# Patient Record
Sex: Male | Born: 1950 | Race: Black or African American | Hispanic: No | Marital: Single | Smoking: Former smoker
Health system: Southern US, Community
[De-identification: ages and names within clinical notes are randomized; demographics above are authoritative.]

## PROBLEM LIST (undated history)

## (undated) DIAGNOSIS — F1910 Other psychoactive substance abuse, uncomplicated: Secondary | ICD-10-CM

## (undated) DIAGNOSIS — K219 Gastro-esophageal reflux disease without esophagitis: Secondary | ICD-10-CM

## (undated) DIAGNOSIS — E119 Type 2 diabetes mellitus without complications: Secondary | ICD-10-CM

## (undated) HISTORY — DX: Gastro-esophageal reflux disease without esophagitis: K21.9

## (undated) HISTORY — DX: Other psychoactive substance abuse, uncomplicated: F19.10

## (undated) HISTORY — DX: Type 2 diabetes mellitus without complications: E11.9

## (undated) HISTORY — PX: NASAL FRACTURE SURGERY: SHX718

---

## 2010-08-07 ENCOUNTER — Emergency Department (HOSPITAL_COMMUNITY)
Admission: EM | Admit: 2010-08-07 | Discharge: 2010-08-07 | Disposition: A | Discharge status: Home / Self Care | Payer: No Typology Code available for payment source | Attending: Emergency Medicine | Admitting: Emergency Medicine

## 2010-08-07 DIAGNOSIS — M545 Low back pain, unspecified: Secondary | ICD-10-CM | POA: Insufficient documentation

## 2010-08-07 DIAGNOSIS — S139XXA Sprain of joints and ligaments of unspecified parts of neck, initial encounter: Secondary | ICD-10-CM | POA: Insufficient documentation

## 2010-08-07 DIAGNOSIS — I10 Essential (primary) hypertension: Secondary | ICD-10-CM | POA: Insufficient documentation

## 2010-08-07 DIAGNOSIS — Z794 Long term (current) use of insulin: Secondary | ICD-10-CM | POA: Insufficient documentation

## 2010-08-07 DIAGNOSIS — M542 Cervicalgia: Secondary | ICD-10-CM | POA: Insufficient documentation

## 2010-08-07 DIAGNOSIS — E119 Type 2 diabetes mellitus without complications: Secondary | ICD-10-CM | POA: Insufficient documentation

## 2013-02-28 HISTORY — PX: COLONOSCOPY: SHX174

## 2013-07-12 ENCOUNTER — Encounter: Payer: Self-pay | Admitting: Family Medicine

## 2013-07-12 ENCOUNTER — Ambulatory Visit (INDEPENDENT_AMBULATORY_CARE_PROVIDER_SITE_OTHER): Payer: BC Managed Care – PPO | Admitting: Family Medicine

## 2013-07-12 ENCOUNTER — Telehealth: Payer: Self-pay | Admitting: Family Medicine

## 2013-07-12 VITALS — BP 135/73 | HR 63 | Temp 98.1°F | Ht 66.0 in | Wt 130.0 lb

## 2013-07-12 DIAGNOSIS — Z0000 Encounter for general adult medical examination without abnormal findings: Secondary | ICD-10-CM

## 2013-07-12 DIAGNOSIS — E1165 Type 2 diabetes mellitus with hyperglycemia: Secondary | ICD-10-CM | POA: Insufficient documentation

## 2013-07-12 DIAGNOSIS — R5381 Other malaise: Secondary | ICD-10-CM

## 2013-07-12 DIAGNOSIS — E118 Type 2 diabetes mellitus with unspecified complications: Secondary | ICD-10-CM | POA: Insufficient documentation

## 2013-07-12 DIAGNOSIS — IMO0001 Reserved for inherently not codable concepts without codable children: Secondary | ICD-10-CM

## 2013-07-12 DIAGNOSIS — R5383 Other fatigue: Secondary | ICD-10-CM

## 2013-07-12 DIAGNOSIS — E119 Type 2 diabetes mellitus without complications: Secondary | ICD-10-CM

## 2013-07-12 LAB — POCT GLYCOSYLATED HEMOGLOBIN (HGB A1C): Hemoglobin A1C: 10.5

## 2013-07-12 MED ORDER — ASPIRIN EC 81 MG PO TBEC: 81.0000 mg | DELAYED_RELEASE_TABLET | Freq: Every day | ORAL | Status: DC

## 2013-07-12 MED ORDER — LISINOPRIL 5 MG PO TABS: 5.0000 mg | ORAL_TABLET | Freq: Every day | ORAL | Status: DC

## 2013-07-12 MED ORDER — METFORMIN HCL 500 MG PO TABS: 500.0000 mg | ORAL_TABLET | Freq: Two times a day (BID) | ORAL | Status: DC

## 2013-07-12 MED ORDER — INSULIN GLARGINE 100 UNIT/ML SOLOSTAR PEN: 25.0000 [IU] | PEN_INJECTOR | Freq: Every day | SUBCUTANEOUS | Status: DC

## 2013-07-12 MED ORDER — INSULIN GLARGINE 100 UNIT/ML SOLOSTAR PEN: 20.0000 [IU] | PEN_INJECTOR | Freq: Every day | SUBCUTANEOUS | Status: DC

## 2013-07-12 NOTE — Telephone Encounter (Signed)
Emergency Line Call  Patient calls stating he has not been able to get the lantus. Can't afford due to it being $300. On review of records he saw Dr Althea CharonKaramalegos today and was found to have an A1c of 10.5. He wrote a prescription for lantus and the patient can not afford this. He still has lantus at home at this time and can take this until Monday. He was able to get the metformin. I advised to use the lantus he has at home and to take the metformin as prescribed. I will send this message to Dr Althea CharonKaramalegos for consideration of samples of lantus on Monday. May need additional help in obtaining this medication vs starting a different form of insulin.  Marikay AlarEric Shomari Matusik, MD

## 2013-07-12 NOTE — Progress Notes (Signed)
Subjective:     Patient ID: Jordan GoltzJoseph E Rodriguez, male   DOB: 07/02/1950, 63 y.o.   MRN: [id number]  Patient presents for a new patient evaluation.  HPI  CHRONIC DM, Type 2: Reports (1st dx in 1998) - states it has been a long time since he has had prescribed medicines, in the past he had been occasionally used family member's Lantus, currently almost out. Previously on ACEi, Metformin, daily ASA. Now out of all medications. CBGs: Avg low-mid 100s, Low 83, High 408 (without insulin). Checks CBGs (not regularly, not consistent with 2 hr PP) Meds: Lantus 25u daily (not every day) Reports - poor compliance, irregularly takes meds (no active rx). Tolerating well w/o side-effects Previously on ACEi / ARB Lifestyle: Diet (balanced, eats out frequently / frozen dinners) / Exercise (infrequent walking) Admits occasional fatigue, increased polyuria. Denies visual changes, numbness or tingling.  Health Maintenance: - Never had colonoscopy - Hx prior PNA vaccine  PMH: Denies hx of HTN Surgical Hx: Denies any surgeries.  I have reviewed and updated the following as appropriate: allergies and current medications  Social Hx: - Recently moved back to [place] (previously in [place]) - Works as Copyjanitor full time - Hx substance abuse - Former smoker (quit 1995) - Alcohol abuse (quit 1982)  Review of Systems  See above HPI Also:  Admits to gradual weight loss, unsure how much Denies any recent fever/chills, CP, SOB, HA, weakness, abd pain, n/v, constipation / diarrhea, numbness, tingling.     Objective:   Physical Exam  BP 135/73  Pulse 63  Temp(Src) 98.1 F (36.7 C) (Oral)  Ht 5\' 6"  (1.676 m)  Wt 130 lb (58.968 kg)  BMI 20.99 kg/m2  Gen - thin, well-appearing, pleasant, cooperative, NAD HEENT - NCAT, PERRL, EOMI, oropharynx clear, MMM Neck - supple, non-tender, no thyromegaly Heart - RRR, no murmurs heard Lungs - CTAB, no wheezing, crackles, or rhonchi. Normal work of breathing. Abd  - soft, NTND, no masses, +active BS Ext - non-tender, no edema, peripheral pulses intact +2 b/l Skin - warm, dry, no rashes Neuro - awake, alert, oriented, grossly non-focal, intact muscle strength 5/5 b/l, intact distal sensation to light touch, gait normal      Assessment:     See specific A&P problem list for details.      Plan:     See specific A&P problem list for details.

## 2013-07-12 NOTE — Assessment & Plan Note (Signed)
-   No hx colonoscopy  Plan: 1. Given Colonoscopy handout with # to call for scheduling 2. Sent rx for ASA 81mg  EC daily 3. Baseline labs (CMET, CBC, Fasting Lipids) future ordered, plan for lab only visit

## 2013-07-12 NOTE — Assessment & Plan Note (Signed)
Poorly controlled Current HgbA1c 10.5 (07/12/13), last A1c unknown No complications or hypoglycemia  Plan:  1. New regimen: Lantus 20u daily AM, Metformin 500mg  BID 2. Use current glucose testing supplies - check CBGs fasting AM, 2hr PP (2-3x daily) and record for 1 month, bring to next visit. 3. Start Lisinopril 5mg  daily (for DM renal protection, no hx HTN) 4. Ordered future CMET, Lipid panel - advised pt to return for Lab Only visit fasting for blood draw, review labs at next visit 5. Emphasized lifestyle - Improved DM diet (dec carbs, inc vegs), exercise with walking (previously minimal activity) 6. RTC 1 month to review CBGs, titrate meds, plan to extend to q 3 months

## 2013-07-12 NOTE — Patient Instructions (Signed)
Dear Dolly RiasJoseph Yim, Thank you for coming in to clinic today. It was good to meet you!  Today we discussed your Diabetes and Health Maintenance. 1. For your Diabetes, your A1c was elevated at >10. 2. Prescribed Lantus insulin (start with 20u daily in the evening), take this daily. Also prescribed Metformin 500mg  BID take this twice daily with meals. 3. Please check your blood sugar 2 to 3 times daily ( for the next 1 month) - check it first thing when you wake up, and then 2 hours after breakfast, lunch, and sometimes in the evening 4. Bring your blood sugar readings to your next visit 5. Take daily Aspirin, and Lisinopril 6. Provided sheet for Colonoscopy referral. Please follow the instructions and call the GI doctor office to schedule a colonoscopy as we discussed.  I have ordered labs for you to receive. Recommend calling back our clinic [phone]) to schedule a Lab Only appointment to come in to get your blood drawn, need to be fasting (no food prior for 8 hours), this can be done at any time.  Please schedule a follow-up appointment with me in 1 month (please call to schedule this) for Diabetes check-up.  If you have any other questions or concerns, please feel free to call the clinic to contact me. You may also schedule an earlier appointment if necessary.  However, if your symptoms get significantly worse, please go to the Emergency Department to seek immediate medical attention.  Saralyn PilarAlexander Karamalegos, DO [facility]

## 2013-07-15 ENCOUNTER — Telehealth: Payer: Self-pay | Admitting: Family Medicine

## 2013-07-15 DIAGNOSIS — IMO0001 Reserved for inherently not codable concepts without codable children: Secondary | ICD-10-CM

## 2013-07-15 DIAGNOSIS — E1165 Type 2 diabetes mellitus with hyperglycemia: Principal | ICD-10-CM

## 2013-07-15 MED ORDER — INSULIN GLARGINE 100 UNIT/ML SOLOSTAR PEN: 20.0000 [IU] | PEN_INJECTOR | Freq: Every day | SUBCUTANEOUS | Status: DC

## 2013-07-15 NOTE — Telephone Encounter (Signed)
Patient given 1 lantus pen, will keep lab appt on 5/52.

## 2013-07-15 NOTE — Telephone Encounter (Signed)
Last seen new pt OV 07/12/13 and RE: emergency call about unable to fill Lantus. Prescribed Lantus solostar pen inject 20u daily, however patient unable to afford when attempted to pick up at pharmacy. Patient's insurance listed as BCBS. I called his pharmacy, spoke with pharmacist who said that original cost was >$400, and his insurance reduced it to $300, recommend that he contact his insurance company to ask about the cost (anticipate it is due to a high deductible).  Plan: 1. Recommend patient apply for "Lantus Savings Card" (www.lantus.com/sign-up/offers), or www.lantus.com and click on "Savings Card" button. He is to complete the enrollment information and attempt to apply for card. 2. He may return to [facility] to pick-up a sample Lantus Solostar Pen to provide temporary coverage until his next appointment (already requested to be put aside through [employer]) 3. Patient has upcoming lab only appointment on 07/19/13 @ 9:15am (he may pick up Lantus sample then, please make available for patient pick-up). 4. Recommend scheduling upcoming 1 month follow-up appointment with Dr. Raymondo BandKoval [facility](Pharmacy Clinic) to review DM medication / management options, and provide more assistance with Lantus or insulin benefit programs. Next follow-up with me in total 3 months from 07/12/13 for next A1c check.  If you could please call patient and relay this information, he will need to go ahead and call to schedule the Pharmacy Clinic appointment.  Thank you, Saralyn PilarAlexander Tyreisha Ungar, DO [facility], PGY-1

## 2013-07-15 NOTE — Addendum Note (Signed)
Addended by: Garen GramsBENTON, ASHA F on: 07/15/2013 04:12 PM   Modules accepted: Orders

## 2013-07-19 ENCOUNTER — Other Ambulatory Visit: Payer: No Typology Code available for payment source

## 2013-07-19 DIAGNOSIS — R5383 Other fatigue: Secondary | ICD-10-CM

## 2013-07-19 DIAGNOSIS — E1165 Type 2 diabetes mellitus with hyperglycemia: Principal | ICD-10-CM

## 2013-07-19 DIAGNOSIS — IMO0001 Reserved for inherently not codable concepts without codable children: Secondary | ICD-10-CM

## 2013-07-19 DIAGNOSIS — R5381 Other malaise: Secondary | ICD-10-CM

## 2013-07-19 NOTE — Progress Notes (Signed)
CMP,CBC AND FLP DONE TODAY Jordan Rodriguez 

## 2013-07-20 LAB — COMPREHENSIVE METABOLIC PANEL
ALK PHOS: 78 U/L (ref 39–117)
ALT: 63 U/L — ABNORMAL HIGH (ref 0–53)
AST: 70 U/L — ABNORMAL HIGH (ref 0–37)
Albumin: 3.9 g/dL (ref 3.5–5.2)
BUN: 22 mg/dL (ref 6–23)
CO2: 29 mEq/L (ref 19–32)
CREATININE: 1.02 mg/dL (ref 0.50–1.35)
Calcium: 9.4 mg/dL (ref 8.4–10.5)
Chloride: 102 mEq/L (ref 96–112)
GLUCOSE: 93 mg/dL (ref 70–99)
Potassium: 4.4 mEq/L (ref 3.5–5.3)
SODIUM: 141 meq/L (ref 135–145)
TOTAL PROTEIN: 6.9 g/dL (ref 6.0–8.3)
Total Bilirubin: 0.2 mg/dL (ref 0.2–1.2)

## 2013-07-20 LAB — CBC
HCT: 35.3 % — ABNORMAL LOW (ref 39.0–52.0)
Hemoglobin: 12 g/dL — ABNORMAL LOW (ref 13.0–17.0)
MCH: 27.9 pg (ref 26.0–34.0)
MCHC: 34 g/dL (ref 30.0–36.0)
MCV: 82.1 fL (ref 78.0–100.0)
PLATELETS: 274 10*3/uL (ref 150–400)
RBC: 4.3 MIL/uL (ref 4.22–5.81)
RDW: 14.8 % (ref 11.5–15.5)
WBC: 4.7 10*3/uL (ref 4.0–10.5)

## 2013-07-20 LAB — LIPID PANEL
CHOLESTEROL: 141 mg/dL (ref 0–200)
HDL: 39 mg/dL — ABNORMAL LOW (ref >39)
LDL Cholesterol: 90 mg/dL (ref 0–99)
Total CHOL/HDL Ratio: 3.6 Ratio
Triglycerides: 62 mg/dL (ref <150)
VLDL: 12 mg/dL (ref 0–40)

## 2013-07-23 ENCOUNTER — Encounter: Payer: Self-pay | Admitting: Family Medicine

## 2013-07-31 ENCOUNTER — Encounter: Payer: Self-pay | Admitting: Internal Medicine

## 2013-08-01 ENCOUNTER — Encounter: Payer: Self-pay | Admitting: Pharmacist

## 2013-08-01 ENCOUNTER — Ambulatory Visit (INDEPENDENT_AMBULATORY_CARE_PROVIDER_SITE_OTHER): Payer: BC Managed Care – PPO | Admitting: Pharmacist

## 2013-08-01 VITALS — BP 130/73 | HR 69 | Ht 66.5 in | Wt 136.4 lb

## 2013-08-01 DIAGNOSIS — IMO0001 Reserved for inherently not codable concepts without codable children: Secondary | ICD-10-CM

## 2013-08-01 DIAGNOSIS — E1165 Type 2 diabetes mellitus with hyperglycemia: Principal | ICD-10-CM

## 2013-08-01 MED ORDER — METFORMIN HCL ER 500 MG PO TB24: ORAL_TABLET | ORAL | Status: DC

## 2013-08-01 MED ORDER — INSULIN PEN NEEDLE 31G X 5 MM MISC: 1.0000 | Status: DC | PRN

## 2013-08-01 MED ORDER — INSULIN GLARGINE 100 UNIT/ML SOLOSTAR PEN: 18.0000 [IU] | PEN_INJECTOR | Freq: Every day | SUBCUTANEOUS | Status: DC

## 2013-08-01 NOTE — Patient Instructions (Addendum)
Lantus - change to 18  Units - take in the AM   Continue to take your metformin until gone.   Take one in the AM and Two at night.  The XR (new prescription may be better to tolerate.   Check your blood sugar at a variety of times.  Morning most days AND then other times of the day.   Next visit with Dr. Althea Charon

## 2013-08-01 NOTE — Assessment & Plan Note (Signed)
Diabetes with improved control since elevated A1C and initiation of insulin therapy.  He reports hypoglycemic events and is able to verbalize appropriate hypoglycemia management plan (uses peppermints prn). Patient is mostly meeting goals for blood glucose control, but is suboptimal due to episodes of hypoglycemia. Adjust time of administration and reduce dose of basal insulin Lantus (insulin glargine) to 18 units once daily in the MORNING (instead of the evening).  Increase dose of metformin PO to 500mg  in the morning and 1000mg  in the evening (finishing out current supply).  Once out of current supply, switch to XR dosage form with same directions (prescription sent to pharmacy). Written patient instructions provided.  Follow up on scheduled appointment with PCP on 6/17 and reschedule pharmacy clinic visit when needed.   Total time in face to face counseling: 60 minutes.  Patient seen with Conception Chancy, PharmD Candidate;  Harvie Junior,  PharmD Resident; Forestine Na, PharmD Resident.

## 2013-08-01 NOTE — Progress Notes (Signed)
S:    Jordan Rodriguez is a 63 yo AAM presenting for diabetes evaluation/assessment. Patient with history of type II diabetes who was previously using family member's insulin but recently prescribed Lantus (20 units in the AM, but recently switched to HS dosing) and metformin (500mg  BID) in clinic.  Patient has taken these along with lisinopril since last clinic appointment in mid-May.  He reports some GI side effects upon initiation of metformin, but is now tolerating well.   Reports adherence with medication.   O:  . Lab Results  Component Value Date   HGBA1C 10.5 07/12/2013    Patient takes CBG with Accu-check 360, records since 07/14/2013 available for evaluation Home fasting mid-AM CBG average readings between 85-98 with recording as low as 52, 69, and 73 (mostly since switch to HS dosing) Evening post-prandial/random CBG readings range between 140-170 with occasionaly exertions up to 217, 238, 254...  A/P:  Diabetes with improved control since elevated A1C and initiation of insulin therapy.  He reports hypoglycemic events and is able to verbalize appropriate hypoglycemia management plan (uses peppermints prn). Patient is mostly meeting goals for blood glucose control, but is suboptimal due to episodes of hypoglycemia. Adjust time of administration and reduce dose of basal insulin Lantus (insulin glargine) to 18 units once daily in the MORNING (instead of the evening).  Increase dose of metformin PO to 500mg  in the morning and 1000mg  in the evening (finishing out current supply).  Once out of current supply, switch to XR dosage form with same directions (prescription sent to pharmacy). Written patient instructions provided.  Follow up on scheduled appointment with PCP on 6/17 and reschedule pharmacy clinic visit when needed.   Total time in face to face counseling: 60 minutes.  Patient seen with Conception Chancy, PharmD Candidate;  Harvie Junior,  PharmD Resident; Forestine Na, PharmD Resident.

## 2013-08-02 NOTE — Progress Notes (Signed)
Patient ID: Jordan Rodriguez, male   DOB: 1950-06-14, 63 y.o.   MRN: [id number] Reviewed: Agree with Dr. Macky Lower documentation and management.

## 2013-08-05 ENCOUNTER — Other Ambulatory Visit: Payer: Self-pay | Admitting: *Deleted

## 2013-08-05 DIAGNOSIS — E1165 Type 2 diabetes mellitus with hyperglycemia: Principal | ICD-10-CM

## 2013-08-05 DIAGNOSIS — IMO0001 Reserved for inherently not codable concepts without codable children: Secondary | ICD-10-CM

## 2013-08-05 MED ORDER — INSULIN PEN NEEDLE 31G X 5 MM MISC: 1.0000 | Status: DC | PRN

## 2013-08-05 NOTE — Telephone Encounter (Signed)
Request is for shorter needles.  .memd

## 2013-08-14 ENCOUNTER — Encounter: Payer: Self-pay | Admitting: Family Medicine

## 2013-08-14 ENCOUNTER — Ambulatory Visit (INDEPENDENT_AMBULATORY_CARE_PROVIDER_SITE_OTHER): Payer: BC Managed Care – PPO | Admitting: Family Medicine

## 2013-08-14 DIAGNOSIS — R7401 Elevation of levels of liver transaminase levels: Secondary | ICD-10-CM

## 2013-08-14 DIAGNOSIS — D649 Anemia, unspecified: Secondary | ICD-10-CM

## 2013-08-14 DIAGNOSIS — E1165 Type 2 diabetes mellitus with hyperglycemia: Principal | ICD-10-CM

## 2013-08-14 DIAGNOSIS — IMO0001 Reserved for inherently not codable concepts without codable children: Secondary | ICD-10-CM

## 2013-08-14 DIAGNOSIS — R748 Abnormal levels of other serum enzymes: Secondary | ICD-10-CM | POA: Insufficient documentation

## 2013-08-14 DIAGNOSIS — R740 Nonspecific elevation of levels of transaminase and lactic acid dehydrogenase [LDH]: Secondary | ICD-10-CM

## 2013-08-14 NOTE — Assessment & Plan Note (Addendum)
Asymptomatic. Reviewed prior CBC with Hgb 12.0, no baseline labs to compare. MCV 80s, consistent with normocytic anemia.  Plan: 1. Arranged colonoscopy 09/2013 at [facility] for routine colon CA screening 2. Continue to monitor Hgb 3. Consider iron studies in future

## 2013-08-14 NOTE — Assessment & Plan Note (Addendum)
Improved control Last HgbA1c 10.5 (07/12/13). Occasional mild hypoglycemia No complications  Plan:  1. Continue Lantus 20u daily. 2. Continue Metformin 500mg  AM , 1000mg  PM 3. Improve lifestyle - inc home prepared foods, dec eating out / frozen 4. Set goal for exercise (walking 1-2x days weekly) 5. Advised to contact Lantus company regarding renewal of assistance card. Call clinic if any concerns prior to running out of insulin. 6. RTC 2 months - A1c

## 2013-08-14 NOTE — Assessment & Plan Note (Signed)
Mild elevated transaminases, without predominance. AST 70 / ALT 63, Alk phos 78, T.Bili 0.2 Significant h/o EtOH abuse (since quit > 30 years). Suspect likely Alcoholic Liver Disease, vs fatty liver  Plan: 1. Re-check LFTs in 6 months from initial levels (07/12/13). 2. If persistently elevated, will consider further work-up: Hepatitis panel (B, C), Iron, Ferritin, TIBC, and RUQ [place].

## 2013-08-14 NOTE — Patient Instructions (Addendum)
Dear Jordan Rodriguez, Thank you for coming in to clinic today. It was good to see you again!  Today we discussed your Diabetes. 1. Your blood sugar readings sound much better! I think you are on the right track. 2. Continue to take Lantus 20 units daily in the morning (with breakfast) 3. Continue to take Metformin 1 tab in morning and 2 tabs in evening. 4. Work on your goal of improving exercise! Regular walking 15-3220mins even 1-2 days a week will help get you get motivated and feel better! The goal is to reduce sugar, not to lose weight!  We discussed your lab results. Your Liver tests were mildly elevated, we can discuss this further at future appointments. I would like to re-check these labs.  I'm very glad that you scheduled the Colonoscopy, I will look for the results.  Please keep using the Lantus company discount card for your insulin prescription. If you have any concerns please call the company who provided it to you, and if you need any assistance from our clinic please call and leave us a message. Make sure that you do this well before you run out. I do not want you to run out and then be stuck without insulin!  Some important numbers from today's visit: BP - 129/82  Please schedule a follow-up appointment with me (Dr. Althea CharonKaramalegos) in 2 months for Diabetes follow-up.  If you have any other questions or concerns, please feel free to call the clinic to contact me. You may also schedule an earlier appointment if necessary.  However, if your symptoms get significantly worse, please go to the Emergency Department to seek immediate medical attention.  Saralyn PilarAlexander Francelia Mclaren, DO [facility]

## 2013-08-14 NOTE — Progress Notes (Signed)
Patient ID: Jordan GoltzJoseph E Rodriguez, male   DOB: 02/09/1951, 63 y.o.   MRN: [id number] Subjective:    HPI  CHRONIC DM, Type 2: - Last seen by Dr. Raymondo BandKoval (08/01/13) Reports no concerns, overall doing better since last visit. Able to purchase Lantus supply with company assistance, total cost $75-100.  CBGs: Avg 150, Low 60 (+weak), High 250. Checks CBGs x2 daily (in AM fasting, various times PM, 2-3 hr postprandial), fasting high 100-120 (not >150) Meds: Lantus 20 units daily, Metformin 500mg  1 in AM and 2 in PM Reports good compliance. Tolerating well w/o side-effects Currently on ACEi Lifestyle: Diet (decreased eating out / inc prepared food) Exercise (walking on the job, no structured exercise) Admits occasional hypoglycemia (mild) Denies polyuria, visual changes, numbness or tingling.  Reviewed lab results from prior visit.  Health Maintenance: - Scheduled Colonoscopy, August 2015 [facility]  I have reviewed and updated the following as appropriate: allergies and current medications  Social Hx: - Former smoker (quit 1995)  Review of Systems  See above HPI     Objective:   Physical Exam  There were no vitals taken for this visit.  Gen - thin, well-appearing, pleasant, NAD HEENT - MMM Heart - RRR, no murmurs heard Lungs - CTAB. Normal work of breathing. Abd - soft, NTND, no masses, +active BS Ext - non-tender, no edema, peripheral pulses intact +2 b/l Neuro - awake, alert, oriented     Assessment:     See specific A&P problem list for details.      Plan:     See specific A&P problem list for details.

## 2013-08-19 ENCOUNTER — Telehealth: Payer: Self-pay | Admitting: *Deleted

## 2013-08-19 NOTE — Telephone Encounter (Signed)
[facility] requesting to change pt's insulin pen needle 31 G x 5 MM to 31 G x 6 MM.  Verbal order given.  Clovis PuMartin, Tamika L, RN

## 2013-09-30 ENCOUNTER — Telehealth: Payer: Self-pay | Admitting: *Deleted

## 2013-09-30 NOTE — Telephone Encounter (Signed)
Pt missed previsit appointment scheduled 09/30/13. LM on cell phone with number identifier to call to reschedule previsit appointment today before 5 pm in order to keep colonoscopy appointment scheduled 10/14/13. If previsit not rescheduled today then colonoscopy will be cancelled and both appointments will need to be rescheduled.

## 2013-10-08 ENCOUNTER — Ambulatory Visit (AMBULATORY_SURGERY_CENTER): Payer: Medicare Other

## 2013-10-08 VITALS — Ht 66.0 in | Wt 138.0 lb

## 2013-10-08 DIAGNOSIS — Z1211 Encounter for screening for malignant neoplasm of colon: Secondary | ICD-10-CM

## 2013-10-08 MED ORDER — MOVIPREP 100 G PO SOLR: 1.0000 | Freq: Once | ORAL | Status: DC

## 2013-10-08 NOTE — Progress Notes (Signed)
No allergies to eggs or soy No home oxygen No diet/weight loss meds No past problems with anesthesia  No email 

## 2013-10-09 ENCOUNTER — Telehealth: Payer: Self-pay | Admitting: Internal Medicine

## 2013-10-09 NOTE — Telephone Encounter (Signed)
Spoke with patient's pharmacy, patient was given the Moviprep, no change per pharmacy. Then spoke with patient he was given the right RX Moviprep. He was confused while waiting for it in the pharmacy, so he called us then. Patient is sure he was given the Moviprep and the pharmacy did use the "free" coupon per pt. No further questions from patient at this time.

## 2013-10-14 ENCOUNTER — Ambulatory Visit (AMBULATORY_SURGERY_CENTER): Payer: Medicare Other | Admitting: Internal Medicine

## 2013-10-14 ENCOUNTER — Encounter: Payer: Self-pay | Admitting: Internal Medicine

## 2013-10-14 VITALS — BP 158/101 | HR 59 | Temp 97.5°F | Resp 13 | Ht 66.0 in | Wt 138.0 lb

## 2013-10-14 DIAGNOSIS — Z1211 Encounter for screening for malignant neoplasm of colon: Secondary | ICD-10-CM

## 2013-10-14 LAB — GLUCOSE, CAPILLARY
GLUCOSE-CAPILLARY: 117 mg/dL — AB (ref 70–99)
Glucose-Capillary: 139 mg/dL — ABNORMAL HIGH (ref 70–99)
Glucose-Capillary: 79 mg/dL (ref 70–99)
Glucose-Capillary: 77 mg/dL (ref 70–99)

## 2013-10-14 MED ORDER — SODIUM CHLORIDE 0.9 % IV SOLN: 500.0000 mL | INTRAVENOUS | Status: DC

## 2013-10-14 NOTE — Progress Notes (Signed)
Procedure ends, to recovery, report given and VSS. 

## 2013-10-14 NOTE — Patient Instructions (Signed)
YOU HAD AN ENDOSCOPIC PROCEDURE TODAY AT THE [facility]: Refer to the procedure report that was given to you for any specific questions about what was found during the examination.  If the procedure report does not answer your questions, please call your gastroenterologist to clarify.  If you requested that your care partner not be given the details of your procedure findings, then the procedure report has been included in a sealed envelope for you to review at your convenience later.  YOU SHOULD EXPECT: Some feelings of bloating in the abdomen. Passage of more gas than usual.  Walking can help get rid of the air that was put into your GI tract during the procedure and reduce the bloating. If you had a lower endoscopy (such as a colonoscopy or flexible sigmoidoscopy) you may notice spotting of blood in your stool or on the toilet paper. If you underwent a bowel prep for your procedure, then you may not have a normal bowel movement for a few days.  DIET: Your first meal following the procedure should be a light meal and then it is ok to progress to your normal diet.  A half-sandwich or bowl of soup is an example of a good first meal.  Heavy or fried foods are harder to digest and may make you feel nauseous or bloated.  Likewise meals heavy in dairy and vegetables can cause extra gas to form and this can also increase the bloating.  Drink plenty of fluids but you should avoid alcoholic beverages for 24 hours.  ACTIVITY: Your care partner should take you home directly after the procedure.  You should plan to take it easy, moving slowly for the rest of the day.  You can resume normal activity the day after the procedure however you should NOT DRIVE or use heavy machinery for 24 hours (because of the sedation medicines used during the test).    SYMPTOMS TO REPORT IMMEDIATELY: A gastroenterologist can be reached at any hour.  During normal business hours, 8:30 AM to 5:00 PM Monday through Friday,  call [phone].  After hours and on weekends, please call the GI answering service at [phone] who will take a message and have the physician on call contact you.   Following lower endoscopy (colonoscopy or flexible sigmoidoscopy):  Excessive amounts of blood in the stool  Significant tenderness or worsening of abdominal pains  Swelling of the abdomen that is new, acute  Fever of 100F or higher    FOLLOW UP: If any biopsies were taken you will be contacted by phone or by letter within the next 1-3 weeks.  Call your gastroenterologist if you have not heard about the biopsies in 3 weeks.  Our staff will call the home number listed on your records the next business day following your procedure to check on you and address any questions or concerns that you may have at that time regarding the information given to you following your procedure. This is a courtesy call and so if there is no answer at the home number and we have not heard from you through the emergency physician on call, we will assume that you have returned to your regular daily activities without incident.  SIGNATURES/CONFIDENTIALITY: You and/or your care partner have signed paperwork which will be entered into your electronic medical record.  These signatures attest to the fact that that the information above on your After Visit Summary has been reviewed and is understood.  Full responsibility of the confidentiality   of this discharge information lies with you and/or your care-partner.   Information on high fiber diet given to you today (recommended for diverticulosis)

## 2013-10-14 NOTE — Op Note (Signed)
[facility] [address] [place], [id number]   COLONOSCOPY PROCEDURE REPORT  PATIENT: Jordan GoltzBurroughs, Brantly E.  MR#: [id number] BIRTHDATE: 1950/11/02 , 63  yrs. old GENDER: Male ENDOSCOPIST: Beverley FiedlerJay M Pyrtle, MD REFERRED BY: Saralyn PilarAlexander Karamalegos, MD PROCEDURE DATE:  10/14/2013 PROCEDURE:   Colonoscopy, screening First Screening Colonoscopy - Avg.  risk and is 50 yrs.  old or older Yes.  Prior Negative Screening - Now for repeat screening. N/A  History of Adenoma - Now for follow-up colonoscopy & has been > or = to 3 yrs.  N/A  Polyps Removed Today? No.  Recommend repeat exam, <10 yrs? No. ASA CLASS:   Class II INDICATIONS:average risk screening and first colonoscopy. MEDICATIONS: MAC sedation, administered by CRNA and propofol (Diprivan) 350mg  IV  DESCRIPTION OF PROCEDURE:   After the risks benefits and alternatives of the procedure were thoroughly explained, informed consent was obtained.  A digital rectal exam revealed no rectal mass.   The LB [id number]  endoscope was introduced through the anus and advanced to the cecum, which was identified by both the appendix and ileocecal valve. No adverse events experienced. The quality of the prep was good, using MoviPrep  The instrument was then slowly withdrawn as the colon was fully examined.   COLON FINDINGS: There was mild scattered diverticulosis noted in the ascending colon and at the hepatic flexure.   The colon was otherwise normal.  There was no diverticulosis, inflammation, polyps or cancers unless previously stated.  Retroflexed views revealed no abnormalities. The time to cecum=5 minutes 36 seconds. Withdrawal time=9 minutes 54 seconds.  The scope was withdrawn and the procedure completed.  COMPLICATIONS: There were no complications.  ENDOSCOPIC IMPRESSION: 1.   There was mild diverticulosis noted in the ascending colon and at the hepatic flexure 2.   The colon was otherwise  normal  RECOMMENDATIONS: 1.  High fiber diet 2.  You should continue to follow colorectal cancer screening guidelines for "routine risk" patients with a repeat colonoscopy in 10 years.  There is no need for FOBT (stool) testing for at least 5 years.   eSigned:  Beverley FiedlerJay M Pyrtle, MD 10/14/2013 11:47 AM      cc: The Patient; Saralyn PilarAlexander Karamalegos, MD

## 2013-10-15 ENCOUNTER — Telehealth: Payer: Self-pay | Admitting: *Deleted

## 2013-10-15 NOTE — Telephone Encounter (Signed)
  Follow up Call-  Call back number 10/14/2013  Post procedure Call Back phone  # [phone]  Permission to leave phone message Yes     Patient questions:  Do you have a fever, pain , or abdominal swelling? No. Pain Score  0 *  Have you tolerated food without any problems? Yes.    Have you been able to return to your normal activities? Yes.    Do you have any questions about your discharge instructions: Diet   No. Medications  No. Follow up visit  No.  Do you have questions or concerns about your Care? No.  Actions: * If pain score is 4 or above: No action needed, pain <4.

## 2013-10-28 ENCOUNTER — Ambulatory Visit (INDEPENDENT_AMBULATORY_CARE_PROVIDER_SITE_OTHER): Payer: No Typology Code available for payment source | Admitting: Family Medicine

## 2013-10-28 ENCOUNTER — Encounter: Payer: Self-pay | Admitting: Family Medicine

## 2013-10-28 VITALS — BP 124/68 | HR 61 | Temp 98.1°F | Ht 66.0 in | Wt 135.3 lb

## 2013-10-28 DIAGNOSIS — K5730 Diverticulosis of large intestine without perforation or abscess without bleeding: Secondary | ICD-10-CM

## 2013-10-28 DIAGNOSIS — E1165 Type 2 diabetes mellitus with hyperglycemia: Principal | ICD-10-CM

## 2013-10-28 DIAGNOSIS — IMO0001 Reserved for inherently not codable concepts without codable children: Secondary | ICD-10-CM

## 2013-10-28 LAB — POCT GLYCOSYLATED HEMOGLOBIN (HGB A1C): HEMOGLOBIN A1C: 7.1

## 2013-10-28 MED ORDER — INSULIN GLARGINE 100 UNIT/ML SOLOSTAR PEN: 15.0000 [IU] | PEN_INJECTOR | Freq: Every day | SUBCUTANEOUS | Status: DC

## 2013-10-28 MED ORDER — LISINOPRIL 5 MG PO TABS: 5.0000 mg | ORAL_TABLET | Freq: Every day | ORAL | Status: DC

## 2013-10-28 NOTE — Progress Notes (Signed)
Patient ID: Jordan Rodriguez, male   DOB: [birth year], 63 y.o.   MRN: [id number] Subjective:    HPI  CHRONIC DM, Type 2: Reports no concerns CBGs: Avg 90, Low 50-60 (feels some weakness, faint), High 130. Checks CBGs daily Meds: Lantus 20u daily in morning, Metformin-XR  x 1 AM and x2 PM Reports good compliance. Tolerating well w/o side-effects Currently on ACEi (ran out 1 week ago) Lifestyle: Diet (working on reducing eating out, pre-packed foods) / Exercise (no regular exercise) Admits hypoglycemia Denies polyuria, visual changes, numbness or tingling.  Health Maintenance: - Completed Colonoscopy 10/14/13 - mild diverticulosis, otherwise normal, repeat due 10 years  I have reviewed and updated the following as appropriate: allergies and current medications  Social Hx: - Former smoker  Review of Systems  See above HPI     Objective:   Physical Exam  BP 124/68  Pulse 61  Temp(Src) 98.1 F (36.7 C) (Oral)  Ht  (1.676 m)  Wt 135 lb 4.8 oz (61.372 kg)  BMI 21.85 kg/m2  Gen - well-appearing, pleasant, NAD Heart - RRR, no murmurs heard Lungs - CTAB. Normal work of breathing. Abd - soft, NTND, no masses, +active BS Ext - non-tender, no edema, peripheral pulses intact +2 b/l Neuro - awake, alert, oriented, grossly non-focal, intact muscle strength bilateral ext and distal sensation to light touch, gait normal     Assessment:     See specific A&P problem list for details.      Plan:     See specific A&P problem list for details.

## 2013-10-28 NOTE — Patient Instructions (Signed)
Dear Jordan Rodriguez, Thank you for coming in to clinic today. It was good to see you again!  Today we discussed your Diabetes. 1. Blood sugar looks excellent. A1c 7.1. Congratulations! 2. Reduce your Lantus from 20 to 15 units in morning. Continue checking sugar and bring meter to next visit. 3. If your blood sugars are still low in 60s, then go down to 10 units in morning. 4. Work on your goal of improving exercise! Regular walking 15-87mins even 1-2 days a week will help get you get motivated and feel better! The goal is to reduce sugar, not to lose weight!  Colonoscopy looks good - you are done for 10 years!  Please schedule a follow-up appointment with me (Dr. Althea Charon) in 6 months for Diabetes follow-up.  If you have any other questions or concerns, please feel free to call the clinic to contact me. You may also schedule an earlier appointment if necessary.  However, if your symptoms get significantly worse, please go to the Emergency Department to seek immediate medical attention.  Jordan Pilar, DO [facility]

## 2013-10-29 DIAGNOSIS — K5730 Diverticulosis of large intestine without perforation or abscess without bleeding: Secondary | ICD-10-CM | POA: Insufficient documentation

## 2013-10-29 NOTE — Assessment & Plan Note (Signed)
Significantly improved. Well-controlled. Current HgbA1c 7.1 (10/28/13), last HgbA1c 10.5 (07/12/13). Some significant hypoglycemia No complications DM Foot Exam (normal, 10/28/13)  Plan:  1. Reduce Lantus to 15u daily in AM due to hypoglycemia with CBGs 50-60s and symptomatic, may titrate down gradually to 10 if persistent CBGs 60s 2. Continue Metformin 3. Lifestyle Mods - advised regular exercise with walking, cont work on improving DM diet 4. Plans to schedule eye exam 5. RTC 6 months, A1c

## 2014-02-03 ENCOUNTER — Telehealth: Payer: Self-pay | Admitting: Family Medicine

## 2014-02-03 DIAGNOSIS — E1165 Type 2 diabetes mellitus with hyperglycemia: Secondary | ICD-10-CM

## 2014-02-03 DIAGNOSIS — IMO0002 Reserved for concepts with insufficient information to code with codable children: Secondary | ICD-10-CM

## 2014-02-03 NOTE — Telephone Encounter (Signed)
Pt called back to check the status of his refill request. He has not had any lantus since Friday 12/4. jw

## 2014-02-03 NOTE — Telephone Encounter (Signed)
Pt tried getting his lantus refilled but says insurance no longer covers it, thinks the pharmacy faxed us information. Pt needs to know what to do.

## 2014-02-04 MED ORDER — INSULIN DETEMIR 100 UNIT/ML FLEXPEN: 15.0000 [IU] | PEN_INJECTOR | Freq: Every day | SUBCUTANEOUS | Status: DC

## 2014-02-04 NOTE — Telephone Encounter (Signed)
Pt called back and wants to know what type medication he is going to call in. jw

## 2014-02-04 NOTE — Telephone Encounter (Signed)
Chart reviewed. Patient currently on Lantus 15u daily, however recently ran out 12/4, Lantus no longer covered by ins, required Prior Authorization. [facility], spoke with pharmacist, states PA was faxed (however I do not have it in my box at this time), pharmacist stated that alternatives covered by ins include levemir flextouch pen, phoned in new request of Levemir Flextouch pen 15u inject SQ daily with 15mL dispense (1 box) 3 refills, pharmacy will call and notify patient of this medication change and when it is ready to pick-up.  Called patient, spoke with Mr Dolly RiasJoseph Dyches, relayed the above message, he understand switch to Levemir, same dose, plans to pick up insulin when ready from pharmacy. Questions answered.  Saralyn PilarAlexander Archie Shea, DO [facility], PGY-2

## 2014-02-05 NOTE — Telephone Encounter (Signed)
Pt was told by pharmacy they did not have the levemir and didn't know when they would get some in Please advise

## 2014-06-08 ENCOUNTER — Other Ambulatory Visit: Payer: Self-pay | Admitting: Family Medicine

## 2014-06-08 DIAGNOSIS — E119 Type 2 diabetes mellitus without complications: Secondary | ICD-10-CM

## 2014-09-16 ENCOUNTER — Encounter: Payer: Self-pay | Admitting: Family Medicine

## 2014-09-16 ENCOUNTER — Ambulatory Visit (INDEPENDENT_AMBULATORY_CARE_PROVIDER_SITE_OTHER): Payer: 59 | Admitting: Family Medicine

## 2014-09-16 DIAGNOSIS — E119 Type 2 diabetes mellitus without complications: Secondary | ICD-10-CM

## 2014-09-16 DIAGNOSIS — E1165 Type 2 diabetes mellitus with hyperglycemia: Secondary | ICD-10-CM

## 2014-09-16 DIAGNOSIS — R531 Weakness: Secondary | ICD-10-CM | POA: Diagnosis not present

## 2014-09-16 DIAGNOSIS — IMO0001 Reserved for inherently not codable concepts without codable children: Secondary | ICD-10-CM

## 2014-09-16 LAB — POCT GLYCOSYLATED HEMOGLOBIN (HGB A1C): Hemoglobin A1C: 10.6

## 2014-09-16 NOTE — Assessment & Plan Note (Signed)
Currently hyperglycemia with some general weakness otherwise asymptomatic. Significant worsening control, now considered uncontrolled (recently out Levemir, Metformin x 4-5 days, however prior h/o non-adherence due to cost) Current HgbA1c 10.6 (7/19), last HgbA1c 7.1 (10/28/13). No complications or hypoglycemia.  Plan:  1. Increase Levemir from 20u qd to 25u daily - pick up insulin today (afford due to financial assistance from friend), titration instructions by 2u q 3-5 days if >200 consistently. 2. Increase Metformin-XR to 1000mg  BID (tolerating 500 AM and 1000 PM), will need new rx quantity or str at next visit 3. Lifestyle Mods - Improved DM diet (dec carbs, inc vegs, handout given), exercise 4. Recommend starting mod/high intensity statin - discussed briefly, consider at next visit, concern with cost 5. RTC 2-3 weeks, scheduled 8/3 with me for physical and DM follow-up, will address repeat foot exam, ophtho referral, immunizations (PNA, TDap), need to bring CBG log

## 2014-09-16 NOTE — Progress Notes (Signed)
Patient ID: Jordan GoltzJoseph E Sharrow, male   DOB: 03/14/1950, 64 y.o.   MRN: [id number] Subjective:   Patient presents for a same day appointment.   HPI  HYPERGLYCEMIA / GENERALIZED WEAKNESS: - Patient called with elevated CBG up to 273 this AM and repeat check >500 about 1 hour prior to arrival. [facility], requested to be seen Same Day with provider. - Today complains of generalized weakness for past several days, does not seem to be worsening but little improvement. Feels "fatigued" but no specific localized weakness in arm, leg. Not associated with pain or muscle aches. States he ran out of all medicines including Levemir 4-5 days ago, due to limited finances unable to afford the refill available. However, now has family friend able to purchase meds for next 1-3 months, awaiting to pick up refills today from pharmacy. - Mild decreased PO intake, however seems to be eating overall good recently, but not following a DM diet, not reducing carbs. Admits to staying hydrated, drinking plenty of water. - Denies fevers/chills, CP, SOB, HA, loss of vision, dizziness or vertigo, abd pain, nausea / vomiting, dysuria, back pain, focal weakness, numbness, tingling  CHRONIC DM, Type 2: Reports - concern with unable to afford all medicines for past 4 days, out of Levemir and Metformin CBGs: Avg 150s, Low 113, High 240. Checks CBGs daily fasting x1 AM Meds: Levemir 20u daily in AM (missed 1-2x doses each month for past 1 year), Metformin-XR 500mg  x 1 AM and x2 PM Reports good compliance. Tolerating well w/o side-effects Currently on ACEi (ran out 4-5 day ago) Lifestyle: Diet (admits not following DM diet) / Exercise (no regular exercise) Admits nocturia Denies polyuria, visual changes, numbness or tingling.  I have reviewed and updated the following as appropriate: allergies and current medications  Social Hx: - Former smoker - Currently employed  Review of Systems  See above HPI      Objective:   Physical Exam  BP 148/60 mmHg  Pulse 63  Temp(Src) 98.3 F (36.8 C) (Oral)  Ht 5\' 6"  (1.676 m)  Wt 133 lb 4.8 oz (60.464 kg)  BMI 21.53 kg/m2  Gen - thin, well-appearing, comfortable and cooperative, NAD HEENT - NCAT, PERRL, EOMI, oropharynx clear, mild dry MM and tongue Neck - supple, non-tender, no LAD Heart - RRR, no murmurs heard Lungs - CTAB. Non-labored. Abd - soft, NTND, no masses, +active BS Ext - non-tender, no edema, peripheral pulses intact +2 b/l Neuro - awake, alert, oriented, grossly non-focal, intact muscle str 5/5 b/l grip, biceps flex, knee flex, ankle dorsiflex and distal sensation to light touch, gait normal     Assessment & Plan:     See specific A&P problem list for details.

## 2014-09-16 NOTE — Patient Instructions (Addendum)
Dear Jordan Rodriguez, Thank you for coming in to clinic today. It was good to see you again!  Today we discussed your Diabetes. 1. Elevated blood sugar over past several months - A1c from 7 to now 10. - Increase Levemir from 20u daily to 25u daily start today once get insulin. - You may continue to increase by 2 units every 3 to 5 days, if your fasting morning blood sugar is >200 consistently - If you get to 40units daily then please call and come back sooner - Increase Metformin to 2 tablets (500mg ) in morning and 2 at night  Try to eat healthy DM diet  Diet Recommendations for Diabetes   Starchy (carb) foods include: Bread, rice, pasta, potatoes, corn, crackers, bagels, muffins, all baked goods.   Protein foods include: Meat, fish, poultry, eggs, dairy foods, and beans such as pinto and kidney beans (beans also provide carbohydrate).   1. Eat at least 3 meals and 1-2 snacks per day. Never go more than 4-5 hours while awake without eating.  2. Limit starchy foods to TWO per meal and ONE per snack. ONE portion of a starchy  food is equal to the following:   - ONE slice of bread (or its equivalent, such as half of a hamburger bun).   - 1/2 cup of a "scoopable" starchy food such as potatoes or rice.   - 1 OUNCE (28 grams) of starchy snack foods such as crackers or pretzels (look on label).   - 15 grams of carbohydrate as shown on food label.  3. Both lunch and dinner should include a protein food, a carb food, and vegetables.   - Obtain twice as many veg's as protein or carbohydrate foods for both lunch and dinner.   - Try to keep frozen veg's on hand for a quick vegetable serving.     - Fresh or frozen veg's are best.  4. Breakfast should always include protein.    Work on your goal of improving exercise! Regular walking 15-5620mins even 1-2 days a week will help get you get motivated and feel better! The goal is to reduce sugar, not to lose weight!  Appointment on Wednesday August 3rd  at 1:45pm for DM follow-up  If you have any other questions or concerns, please feel free to call the clinic to contact me. You may also schedule an earlier appointment if necessary.  However, if your symptoms get significantly worse, please go to the Emergency Department to seek immediate medical attention.  Saralyn PilarAlexander Barba Solt, DO [facility]

## 2014-09-16 NOTE — Assessment & Plan Note (Signed)
Likely due to recent hyperglycemia, x 4-5 days without levemir insulin. No focal symptoms, no other associated symptoms. PO well and hydrated. - Afebrile, vitals stable, no tachycardic. Exam non-focal without neuro findings.  Plan: 1. Reassurance. Start with therapy for treating hyperglycemia. 2. Continue regular diet, improve hydration 3. No labs today - due for routine BMET at next physical 2-3 weeks

## 2014-10-01 ENCOUNTER — Ambulatory Visit (INDEPENDENT_AMBULATORY_CARE_PROVIDER_SITE_OTHER): Payer: 59 | Admitting: Family Medicine

## 2014-10-01 ENCOUNTER — Encounter: Payer: Self-pay | Admitting: Family Medicine

## 2014-10-01 VITALS — BP 97/56 | HR 67 | Temp 98.3°F | Ht 66.0 in | Wt 132.8 lb

## 2014-10-01 DIAGNOSIS — R748 Abnormal levels of other serum enzymes: Secondary | ICD-10-CM | POA: Diagnosis not present

## 2014-10-01 DIAGNOSIS — R7989 Other specified abnormal findings of blood chemistry: Secondary | ICD-10-CM

## 2014-10-01 DIAGNOSIS — E1165 Type 2 diabetes mellitus with hyperglycemia: Secondary | ICD-10-CM | POA: Diagnosis not present

## 2014-10-01 DIAGNOSIS — R740 Nonspecific elevation of levels of transaminase and lactic acid dehydrogenase [LDH]: Secondary | ICD-10-CM

## 2014-10-01 DIAGNOSIS — IMO0001 Reserved for inherently not codable concepts without codable children: Secondary | ICD-10-CM

## 2014-10-01 LAB — COMPREHENSIVE METABOLIC PANEL
ALBUMIN: 4.2 g/dL (ref 3.6–5.1)
ALT: 25 U/L (ref 9–46)
AST: 31 U/L (ref 10–35)
Alkaline Phosphatase: 73 U/L (ref 40–115)
BUN: 23 mg/dL (ref 7–25)
CO2: 25 mmol/L (ref 20–31)
Calcium: 9.6 mg/dL (ref 8.6–10.3)
Chloride: 101 mmol/L (ref 98–110)
Creat: 1.3 mg/dL — ABNORMAL HIGH (ref 0.70–1.25)
Glucose, Bld: 56 mg/dL — ABNORMAL LOW (ref 65–99)
POTASSIUM: 4 mmol/L (ref 3.5–5.3)
SODIUM: 140 mmol/L (ref 135–146)
TOTAL PROTEIN: 7.4 g/dL (ref 6.1–8.1)
Total Bilirubin: 0.4 mg/dL (ref 0.2–1.2)

## 2014-10-01 LAB — LDL CHOLESTEROL, DIRECT: LDL DIRECT: 86 mg/dL (ref <130)

## 2014-10-01 MED ORDER — LISINOPRIL 5 MG PO TABS: 5.0000 mg | ORAL_TABLET | Freq: Every day | ORAL | Status: DC

## 2014-10-01 MED ORDER — METFORMIN HCL 1000 MG PO TABS
1000.0000 mg | ORAL_TABLET | Freq: Two times a day (BID) | ORAL | Status: DC
Start: 2014-10-01 — End: 2015-02-11

## 2014-10-01 MED ORDER — ATORVASTATIN CALCIUM 40 MG PO TABS: 40.0000 mg | ORAL_TABLET | Freq: Every day | ORAL | Status: DC

## 2014-10-01 NOTE — Assessment & Plan Note (Addendum)
H/o mild elevated transaminases, likely h/o alcohol vs fatty liver disease - Re-check CMET today, trend LFTs - Consider hepatitis panel, iron studies, ferritin, RUQ [place] in future  Update 10/02/14 Resolved transaminitis - AST 31, ALT 25, Alk phos 73. LFTs significantly resolved from previous mild elevation.

## 2014-10-01 NOTE — Patient Instructions (Signed)
Dear Jordan Rodriguez, Thank you for coming in to clinic today. It was good to see you again!  Today we discussed your Diabetes. 1. Continue Levemir 25u as instructed. Sent new rx Metformin  twice daily (only 1 pill in AM and then 1 in PM) - Stay well hydrated, don't skip meals 2. Start Atorvastatin  daily for reducing risk heart attack, if tolerating well, then next time we can do 90 day supply  Referral to Eye Doctor made, you will get called with apt  Try to eat healthy DM diet  Diet Recommendations for Diabetes   Starchy (carb) foods include: Bread, rice, pasta, potatoes, corn, crackers, bagels, muffins, all baked goods.   Protein foods include: Meat, fish, poultry, eggs, dairy foods, and beans such as pinto and kidney beans (beans also provide carbohydrate).   1. Eat at least 3 meals and 1-2 snacks per day. Never go more than 4-5 hours while awake without eating.  2. Limit starchy foods to TWO per meal and ONE per snack. ONE portion of a starchy  food is equal to the following:   - ONE slice of bread (or its equivalent, such as half of a hamburger bun).   - 1/2 cup of a "scoopable" starchy food such as potatoes or rice.   - 1 OUNCE (28 grams) of starchy snack foods such as crackers or pretzels (look on label).   - 15 grams of carbohydrate as shown on food label.  3. Both lunch and dinner should include a protein food, a carb food, and vegetables.   - Obtain twice as many veg's as protein or carbohydrate foods for both lunch and dinner.   - Try to keep frozen veg's on hand for a quick vegetable serving.     - Fresh or frozen veg's are best.  4. Breakfast should always include protein.    Work on your goal of improving exercise! Regular walking 15-54mins even 1-2 days a week will help get you get motivated and feel better! The goal is to reduce sugar, not to lose weight!  Please schedule follow-up with Dr. Althea Charon in 3 months for Diabetes follow-up.  If you have any  other questions or concerns, please feel free to call the clinic to contact me. You may also schedule an earlier appointment if necessary.  However, if your symptoms get significantly worse, please go to the Emergency Department to seek immediate medical attention.  Saralyn Pilar, DO [facility]

## 2014-10-01 NOTE — Assessment & Plan Note (Addendum)
Improved DM control with less hyperglycemia, review of glucometer Not due for repeat A1c, last 10.6 (09/16/14, with significant worsening from 7 previously) No complications or hypoglycemia. - DM foot exam today normal 8/3 - Declined PNA vaccine  Plan:  1. Continue current therapy - Levemir 25u daily, Metformin  BID 2. Lifestyle Mods - Improved DM diet (dec carbs, inc vegs), again recommend starting regular exercise with walking 3. Check CMET, trend SCr, also f/u LFTs 4. Check direct LDL 5. Start atorvastatin  daily, high intensity statin therapy, ASCVD 10 yr risk >12% 6. Referral to Ophtho for annual DM eye exam 7. RTC 3 months for A1c

## 2014-10-01 NOTE — Progress Notes (Addendum)
Patient ID: Jordan Rodriguez, male   DOB: Nov 16, 1950, 64 y.o.   MRN: [id number] Subjective:    HPI  CHRONIC DM, Type 2: Reports - last visit [facility] 7/19 for hyperglycemia CBG >500. Since improved fatigue and resolved hyperglycemia, has resumed his insulin now. CBGs: Avg still 150s, Low 92, High 380. (has glucometer today) Checks CBGs daily fasting x1 AM Meds: Levemir 25u daily in AM (increased from 20u on 7/19), Metformin-XR  x2 tabs BID (for  BID) Reports good compliance. Tolerating well w/o side-effects Currently on ACEi Lifestyle: Diet (working on improving DM diet) / Exercise (no regular exercise) Improved PO intake Denies polyuria, visual changes, numbness or tingling, nausea vomiting  I have reviewed and updated the following as appropriate: allergies and current medications  Social Hx: - Former smoker  Review of Systems  See above HPI     Objective:   Physical Exam  BP 97/56 mmHg  Pulse 67  Temp(Src) 98.3 F (36.8 C) (Oral)  Ht  (1.676 m)  Wt 132 lb 12.8 oz (60.238 kg)  BMI 21.44 kg/m2  Gen - well-appearing, comfortable, NAD HEENT - MMM Neck - supple, non-tender, no LAD Heart - RRR, no murmurs heard Ext - non-tender, no edema, peripheral pulses intact +2 b/l  DM FOOT EXAM - normal appearance, no lesions, calluses, or ulcers, intact sensation to monofilament bilaterally      Assessment & Plan:     See specific A&P problem list for details.

## 2014-10-02 ENCOUNTER — Telehealth: Payer: Self-pay | Admitting: Family Medicine

## 2014-10-02 DIAGNOSIS — N182 Chronic kidney disease, stage 2 (mild): Secondary | ICD-10-CM

## 2014-10-02 DIAGNOSIS — E1122 Type 2 diabetes mellitus with diabetic chronic kidney disease: Secondary | ICD-10-CM | POA: Insufficient documentation

## 2014-10-02 NOTE — Telephone Encounter (Signed)
Last OV 10/01/14 for DM follow-up. Reviewed results of CMET and direct LDL. See below:  1. CMET - Remarkable for elevated SCr 1.30 (previously 06/2013 at 1.02). See note for details. Likely with worsening poorly controlled DM2 (period of unable to afford insulin within last year, elevated A1c up to 10), additionally recently with dehydration with hyperglycemia and poor PO intake, reduced hydration and increased sodas. 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 - Discussed result with patient advised to HOLD Lisinopril  daily at this time. NO NSAIDs. Improve water hydration (reduce sodas) - RTC 2-3 months, re-check BMET / A1c, return criteria to follow-up sooner  2. Mild transaminitis - resolved. Discussed resolved LFTs, no further work-up.  Saralyn Pilar, DO [facility], PGY-3

## 2014-10-02 NOTE — Telephone Encounter (Signed)
Is returning Dr Verdie Shire call

## 2014-10-02 NOTE — Assessment & Plan Note (Addendum)
CMET results with SCr up to 1.30, from 1.0 about 1 year ago. 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 Currently possible mild dehydration and AoCKD. - Repeat BMET in 3 months, follow SCr

## 2014-10-02 NOTE — Addendum Note (Signed)
Addended by: Smitty Cords on: 10/02/2014 02:40 PM   Modules accepted: Kipp Brood

## 2014-10-02 NOTE — Addendum Note (Signed)
Addended by: Smitty Cords on: 10/02/2014 03:06 PM   Modules accepted: Kipp Brood

## 2014-10-16 ENCOUNTER — Encounter: Payer: 59 | Admitting: Family Medicine

## 2014-11-10 ENCOUNTER — Other Ambulatory Visit: Payer: Self-pay | Admitting: Family Medicine

## 2014-11-10 DIAGNOSIS — E1165 Type 2 diabetes mellitus with hyperglycemia: Principal | ICD-10-CM

## 2014-11-10 DIAGNOSIS — IMO0001 Reserved for inherently not codable concepts without codable children: Secondary | ICD-10-CM

## 2014-11-17 ENCOUNTER — Telehealth: Payer: Self-pay | Admitting: Family Medicine

## 2014-11-17 NOTE — Telephone Encounter (Signed)
Need some samples of insulin, metformin, cholestrol meds.  Doesn't have the money to get these

## 2014-11-17 NOTE — Telephone Encounter (Signed)
Last OV 10/01/14 for chronic DM2 follow-up. Patient remains on Levemir and Metformin, previously with recent worsening DM control with increase A1c 7 to 10 (09/2013 to 08/2014), due next re-check A1c 11/2014. Since patient established, known very significant financial concerns, he was recently employed and has [employer] (from what I can see) but perhaps additional med costs are too much.  I believe we do have Levemir insulin pen samples, and he may have 1-2 of these pens for now, to continue current regimen Levemir 25u daily.  Called patient 11/17/14 to review current financial course, he reported that he has recently lost his job and has been out of insurance for past < 1 month, he will work on finding a new job but is unsure on the status of this. He has never met with [place] before.  I advised him to call [facility] tomorrow to speak with Pamala Hurry to schedule a Financial Advising Visit, to evaluate his situation and determine what options he has, he may qualify for [facility], may be able to get him to HD for MAP program for insulin as well.  Could give patient samples of Levemir insulin pen quantity #2 pens (continue home dose 25u daily), may give during patient's financial visit, otherwise make these available for pick-up for patient, will need to notify him once ready. If levemir is not available, can give Lantus x 2 pens (same dose 25u daily). I don't believe we have samples of Atorvastatin or Metformin, but if these are available, he may have these too.  Advised him to call and schedule a Pharmacy Clinic apt with Dr. Valentina Lucks for Diabetes Management within next 1 month (after he meets with Pamala Hurry) to discuss options for insulin based on his financial situation, may need more samples at that time, may be candidate for application for med assistance through company.  Nobie Putnam, [place], PGY-3

## 2014-11-18 NOTE — Telephone Encounter (Signed)
Spoke with patient, he will be by to pick up Levemir samples today.

## 2014-12-04 ENCOUNTER — Other Ambulatory Visit: Payer: Self-pay | Admitting: Family Medicine

## 2014-12-04 DIAGNOSIS — E1165 Type 2 diabetes mellitus with hyperglycemia: Principal | ICD-10-CM

## 2014-12-04 DIAGNOSIS — E0800 Diabetes mellitus due to underlying condition with hyperosmolarity without nonketotic hyperglycemic-hyperosmolar coma (NKHHC): Secondary | ICD-10-CM

## 2014-12-04 DIAGNOSIS — IMO0001 Reserved for inherently not codable concepts without codable children: Secondary | ICD-10-CM

## 2014-12-04 NOTE — Patient Instructions (Signed)
Check for medications at security on tomorrow and nurse will call regarding his insulin. Return to see nurse next week to complete history

## 2014-12-04 NOTE — Congregational Nurse Program (Signed)
Initial visit for this 64 year old. Compliant from client today that he is out of his metformin,Atorvastatin and Felicity Coyer emir flex pin. States he miss handled  his money . Today he states he  has no way  to  get his medications.  Blood sugar this am was 218 mg and  the day before 198 mg. States his blood sugars got out of control when he stopped taking his medications and used the money on Gambling. Recognizes his problems and  is getting help now. Attending AA meetings and is getting counseling  weekly. States he has 2 sons and 1 daughter . 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 Things were going well until 2000 when i started Gambling more.  .Lost everything and has been in the shelter for one week now. .Diabetic since 1999.  Marland KitchenLost his insurance failed to  pay premiums for 2 months , used the money for gambling. Client is out of medications essential to maintain his diabetes and keep him out of the hospital. Reports his  A1-C is 10.

## 2014-12-05 MED ORDER — INSULIN DETEMIR 100 UNIT/ML ~~LOC~~ SOLN: 25.0000 [IU] | Freq: Every day | SUBCUTANEOUS | Status: DC

## 2014-12-05 NOTE — Telephone Encounter (Signed)
Changed rx to Levemir [place] vials 25 u daily, #11 refills. Discontinued the requested levemir flexpen refill.  Saralyn Pilar, DO [facility], PGY-3

## 2014-12-05 NOTE — Telephone Encounter (Signed)
[facility] called stating patient does not have any insurance and the Levemir FlexPen would cost him a lot more.  Please send in the Levemir vials.  The vials are a lot cheaper for the patient. Clovis Pu, RN

## 2014-12-10 NOTE — Congregational Nurse Program (Signed)
12-04-2014 Tc  to  L. M  to  get  pharmacy approval  of the medication Levemir -flex  Pen  or vial preferably vial  Since  It  Is  cheaper.  Approval given and  Pharmacy  was contacted .Will  Fill  Order  and  deliver  to client.  TC to  client to  let  him know  medications  would  be  [facility]  . Client  thanked the  nurse for  her  Help  Client to  Return  Next week  To  complete  history  and  Monitor blood  Sugar  Was ask  to  gring  Glucose   monitor  to  check  readings   Agreed..Marland Kitchen

## 2014-12-30 DIAGNOSIS — E0800 Diabetes mellitus due to underlying condition with hyperosmolarity without nonketotic hyperglycemic-hyperosmolar coma (NKHHC): Secondary | ICD-10-CM

## 2014-12-30 NOTE — Congregational Nurse Program (Signed)
Congregational Nurse Program Note  Date of Encounter: 12/30/2014  Past Medical History: Past Medical History  Diagnosis Date  . Diabetes mellitus without complication (HCC)   . Substance abuse     hx alcohol (quit 1982) and tobacco (quit 1995)  . GERD (gastroesophageal reflux disease)     Encounter Details:     CNP Questionnaire - 12/30/14 2320    Patient Demographics   Is this a new or existing patient? Existing   Patient is considered a/an Not Applicable   Patient Assistance   Patient's financial/insurance status Low Income  receives $800.00 per  month   Patient referred to apply for the following financial assistance Not Applicable   Food insecurities addressed Not Applicable  [facility]  and  $16 per  month food  stamps   Transportation assistance No  client  drives   Assistance securing medications No  not  this  visit ,but  taking medications   Educational health offerings --  client is  a diabetic ,counseled   Encounter Details   Primary purpose of visit Education/Health Concerns;Chronic Illness/Condition Visit;Spiritual Care/Support Visit;Navigating the Healthcare System   Was an Emergency Department visit averted? Not Applicable   Does patient have a medical provider? Yes   Patient referred to Not Applicable  no referral  counseled  regarding  daily  food  intake , informational  sheets given  on eat more ,eat less   Was a mental health screening completed? (GAINS tool) No   Does patient have dental issues? No   Since previous encounter, have you referred patient for abnormal blood pressure that resulted in a new diagnosis or medication change? No   Since previous encounter, have you referred patient for abnormal blood glucose that resulted in a new diagnosis or medication change? No

## 2014-12-30 NOTE — Congregational Nurse Program (Signed)
Client in  to  see nurse  Today  after  she  saw client in the  hallway last  week  and  ask how  he  was managing.Client  states that he is doing well taking his  Medications and monitoring  His blood  Sugars . Brought  In his meter and blood  Sugars  Have been, for the last several DAYS 169, 151, 193, 152, 98, 207, 164 thi am  114 mg. Nurse questioned  Client about his diet ,states he  eats desert every night , pizza ,hamburger and at breakfast 2 pieces of toast. Nurse counseled regarding  Carbohydrates and sugars how to  Eliminate some carb's from his diet and good substitutes. Drinks a lot of diet sodas several times a day. encouraged to  drink more water.   [facility]   [facility] 6-8 months  [facility]  states it is time  but  has to  wait  until he has his  co-pay  and pay  to  re-instate his  insurance . Questioned  regarding his  A1_C results.  Clients understands what  that is and will pay attention to  what it is.  Going  Out  To look for housing on tomorrow.  Still attending his support group and feels fairly good that he has been clean over 30  Days  From gambling.States he feels he has more control of his life and understands what gambling has done to  Him and why he must continue to get his life  back together.

## 2015-01-06 DIAGNOSIS — E0800 Diabetes mellitus due to underlying condition with hyperosmolarity without nonketotic hyperglycemic-hyperosmolar coma (NKHHC): Secondary | ICD-10-CM

## 2015-01-07 DIAGNOSIS — E0800 Diabetes mellitus due to underlying condition with hyperosmolarity without nonketotic hyperglycemic-hyperosmolar coma (NKHHC): Secondary | ICD-10-CM

## 2015-01-11 NOTE — Congregational Nurse Program (Signed)
Client was seen in the hallway ,nurse ask how  He was doing he states fine , ask if he is taking his medications ,he replied ,I am out of my metformin and cholesterol medication and hopes to  Get them ina day  Or so  But has been out of them several days. Nurse ask  client to  come to see her. Client working on getting his insurance back ,will get paid and catch it up. Nurse counseled  regarding his diabetes and ned for his  Medications as his blood sugars range from 174 mg . Nurse will assist with  His medications to  prevent complications.  TC  and referral  to  [facility]  to  get  clients meds.   Meds to  Be delivered on  Tomorrow.   Given  signs and symptoms of  Hypoglycemia  and  Hyperglycemia, counseled  client .  Follow  Up  Tomorrow .

## 2015-01-11 NOTE — Congregational Nurse Program (Signed)
Follow  Up  Visit. Client states  Medications were delivered, and thanked  Nurse, brought in his glucose  meter to  review his  readings.  States this am blood  sugar  was 122 mg. Nurse counseled and   commended on that result. States he has stopped  drinking as many  sodas per day , drinking more water and looking at other healthy behavior changes he can make . States she is still doing well with his  other problem "gambling". Taking his  Insulin. Blodd sugars ranging most  days from 97mg  -197mg  most days 101 mg- 174 mg.

## 2015-01-13 ENCOUNTER — Other Ambulatory Visit: Payer: Self-pay | Admitting: Family Medicine

## 2015-01-13 DIAGNOSIS — E1165 Type 2 diabetes mellitus with hyperglycemia: Principal | ICD-10-CM

## 2015-01-13 DIAGNOSIS — IMO0001 Reserved for inherently not codable concepts without codable children: Secondary | ICD-10-CM

## 2015-01-14 DIAGNOSIS — E0800 Diabetes mellitus due to underlying condition with hyperosmolarity without nonketotic hyperglycemic-hyperosmolar coma (NKHHC): Secondary | ICD-10-CM

## 2015-01-14 NOTE — Congregational Nurse Program (Signed)
Client  In for  blood  sugar monitoring . States am  blood  sugar was 138 mg in the am , blood  Sugars  By  his  meter have  been running  from 122- 245 mg  ,taking his meds almost out of  Insulin pen , to pick up tomorrow ,insurance ban because of non payment on premium lifted ,has paid  Premium now. Client was alert no  obvious  symptoms  of Low  blood  sugar but states he didn't eat lunch.Encouraged to eat meals but as always to watch what he eats

## 2015-01-28 DIAGNOSIS — E0800 Diabetes mellitus due to underlying condition with hyperosmolarity without nonketotic hyperglycemic-hyperosmolar coma (NKHHC): Secondary | ICD-10-CM

## 2015-01-28 NOTE — Congregational Nurse Program (Signed)
Client present to see nurse this pm to  Have blood  Sugar checked . States he is now  Taking 15 units  Of  Insulin and checking his blood sugars  Each am . Brought his meter in and nurse reviewed . Blood  Sugars  have been 01-27-15- 183 mg, 01-26-15  137 mg please  Look at clients meter readings  As blood sugars  Are jumping up and down.  Counseled today  regarding his 24 mg reading and what his A1-C readings  Would  Be . Gave  Client a flier on know  Your  Numbers  As a guide to  How  He managing his  Blood sugars. Client [facility]  [facility] two [facility] ,will know  Friday  If he is eligible for placement or will be discharged by 02-06-15. Ask if he has a plan B he state he will look at what  He needs to  Do  And see what emergency services he can tap into.  Client  Counseled  regarding healthy eating as it  seems he is consuming lots of  carbohydrates  and fried foods. Talked about  more vegetables and fruit ,less carbohydrates. Client has stopped consuming as many sodas which he was commended on.   To see nurse next week, bring PCP appointment date with him.

## 2015-01-28 NOTE — Congregational Nurse Program (Signed)
No  Notes  By  Nurse  This  Day another entry  Was made .

## 2015-02-03 DIAGNOSIS — E0800 Diabetes mellitus due to underlying condition with hyperosmolarity without nonketotic hyperglycemic-hyperosmolar coma (NKHHC): Secondary | ICD-10-CM

## 2015-02-04 NOTE — Congregational Nurse Program (Signed)
Nurse  Spoke  With client in the hallway and he states he got a part time job today working for [employer]. Nurse encouraged client as he states he is waiting on his housing voucher . Client receives  Disability and with his part time job client will  Be able to sub stain himself if he manages his budget well. He talks about  His  experience at the shelter has allowed him time to think about his life and what he needs to do  To keep his life in order.  Client has been able to control his gambling problem with support and is to be commended on his lifestyle behavior change. He  Made his appointment today to follow  With his PCP for 12-20 as it is time for another A1-C level. His blood  Sugar readings  Are ranging  From 96-mg  - 208 mg. Counseling has been done to change some of his nutritional habits  As drinking lots of sodas ,drinking more water and choosing less carbohydrates  And he is aware of the need for more vegetables and fruit in his diet.He skips  Meals a lot and has been counseled on the dangers of taking insulin with no food and starving his body  of  needed  nutrients ..Marland Kitchen

## 2015-02-11 ENCOUNTER — Other Ambulatory Visit: Payer: Self-pay | Admitting: Family Medicine

## 2015-02-11 DIAGNOSIS — E1165 Type 2 diabetes mellitus with hyperglycemia: Principal | ICD-10-CM

## 2015-02-11 DIAGNOSIS — IMO0001 Reserved for inherently not codable concepts without codable children: Secondary | ICD-10-CM

## 2015-02-17 ENCOUNTER — Ambulatory Visit (INDEPENDENT_AMBULATORY_CARE_PROVIDER_SITE_OTHER): Payer: 59 | Admitting: Family Medicine

## 2015-02-17 ENCOUNTER — Encounter: Payer: Self-pay | Admitting: Family Medicine

## 2015-02-17 VITALS — BP 114/59 | HR 74 | Temp 98.4°F | Ht 66.0 in | Wt 134.5 lb

## 2015-02-17 DIAGNOSIS — E1165 Type 2 diabetes mellitus with hyperglycemia: Secondary | ICD-10-CM

## 2015-02-17 DIAGNOSIS — Z1159 Encounter for screening for other viral diseases: Secondary | ICD-10-CM

## 2015-02-17 DIAGNOSIS — E1122 Type 2 diabetes mellitus with diabetic chronic kidney disease: Secondary | ICD-10-CM

## 2015-02-17 DIAGNOSIS — E0800 Diabetes mellitus due to underlying condition with hyperosmolarity without nonketotic hyperglycemic-hyperosmolar coma (NKHHC): Secondary | ICD-10-CM

## 2015-02-17 DIAGNOSIS — N182 Chronic kidney disease, stage 2 (mild): Secondary | ICD-10-CM

## 2015-02-17 DIAGNOSIS — R748 Abnormal levels of other serum enzymes: Secondary | ICD-10-CM

## 2015-02-17 DIAGNOSIS — Z23 Encounter for immunization: Secondary | ICD-10-CM | POA: Diagnosis not present

## 2015-02-17 DIAGNOSIS — R7989 Other specified abnormal findings of blood chemistry: Secondary | ICD-10-CM

## 2015-02-17 DIAGNOSIS — IMO0001 Reserved for inherently not codable concepts without codable children: Secondary | ICD-10-CM

## 2015-02-17 LAB — BASIC METABOLIC PANEL WITH GFR
BUN: 19 mg/dL (ref 7–25)
CHLORIDE: 100 mmol/L (ref 98–110)
CO2: 26 mmol/L (ref 20–31)
CREATININE: 1.1 mg/dL (ref 0.70–1.25)
Calcium: 9.6 mg/dL (ref 8.6–10.3)
GFR, Est African American: 82 mL/min (ref >=60)
GFR, Est Non African American: 71 mL/min (ref >=60)
Glucose, Bld: 76 mg/dL (ref 65–99)
Potassium: 4 mmol/L (ref 3.5–5.3)
Sodium: 139 mmol/L (ref 135–146)

## 2015-02-17 LAB — POCT GLYCOSYLATED HEMOGLOBIN (HGB A1C): HEMOGLOBIN A1C: 10.6

## 2015-02-17 NOTE — Patient Instructions (Signed)
Dear Jordan RiasJoseph Rodriguez, Thank you for coming in to clinic today. It was good to see you again!  Today we discussed your Diabetes. 1.increase dose Levemir to 27 units daily, keep checking morning fasting blood sugar, if this is >120 persistently you can increase to 30 units daily, may gradually go up by 2 units every 5 days until 40 units. - Start checking Blood sugar 2 to 3 times a day, morning and bedtime, also 2 hours after eating lunch or dinner - Stay well hydrated, don't skip meals  You have all refills at pharmacy.  Blood work today - will notify you if concerned.  Flu shot today - you may get a fever and muscle aches, this is normal reaction to shot, this should get better in 1-2 days  Try to schedule appointment with [facility] in future if you can afford this follow-up for Annual Diabetic Eye Exam  Try to eat healthy DM diet  Diet Recommendations for Diabetes   Starchy (carb) foods include: Bread, rice, pasta, potatoes, corn, crackers, bagels, muffins, all baked goods.   Protein foods include: Meat, fish, poultry, eggs, dairy foods, and beans such as pinto and kidney beans (beans also provide carbohydrate).   1. Eat at least 3 meals and 1-2 snacks per day. Never go more than 4-5 hours while awake without eating.  2. Limit starchy foods to TWO per meal and ONE per snack. ONE portion of a starchy  food is equal to the following:   - ONE slice of bread (or its equivalent, such as half of a hamburger bun).   - 1/2 cup of a "scoopable" starchy food such as potatoes or rice.   - 1 OUNCE (28 grams) of starchy snack foods such as crackers or pretzels (look on label).   - 15 grams of carbohydrate as shown on food label.  3. Both lunch and dinner should include a protein food, a carb food, and vegetables.   - Obtain twice as many veg's as protein or carbohydrate foods for both lunch and dinner.   - Try to keep frozen veg's on hand for a quick vegetable serving.     - Fresh  or frozen veg's are best.  4. Breakfast should always include protein.    Work on your goal of improving exercise! Regular walking 15-7120mins even 1-2 days a week will help get you get motivated and feel better! The goal is to reduce sugar, not to lose weight!  Please schedule follow-up with Jordan. Althea Rodriguez in 3 months for Diabetes follow-up.  If you have any other questions or concerns, please feel free to call the clinic to contact me. You may also schedule an earlier appointment if necessary.  However, if your symptoms get significantly worse, please go to the Emergency Department to seek immediate medical attention.  Jordan PilarAlexander Karamalegos, DO [facility]

## 2015-02-17 NOTE — Progress Notes (Signed)
Subjective:    Patient ID: Jordan Rodriguez, male    DOB: Oct 14, 1950, 64 y.o.   MRN: [id number]  Jordan Rodriguez is a 64 y.o. male presenting on 02/17/2015 for Diabetes  HPI  CHRONIC DM, Type 2: - Last [facility] visit 09/2014, has received insulin samples and now paid off pills and insurance re-activated, receiving levemir rx now. - maybe up to 2 weeks without insulin during past 5 months - Attributes to poor dietary habits, usually eating daily sweets including cake, and eats sweet snack at night if not eating dinner well. Difficulty with homelessness, poor financial situation. Drinking mostly water and 0 cal sweetener, no sodas or sweet. - No regular exercise CBGs: Avg still 150-170, Low 120, High 300s. (forgot to bring glucometer it is kept safe in shelter) Checks CBGs daily fasting x1 AM Meds: Levemir 25u daily, Metformin-XR  x2 tabs BID (for  BID) Reports good compliance. Tolerating well w/o side-effects Currently on ACEi Denies polyuria, visual changes, numbness or tingling, nausea vomiting  Social Hx: Insurance active, no longer pursuing [employer] assistance since insurance was active. 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 Diabetes mellitus without complication (HCC)   . Substance abuse     hx alcohol (quit 1982) and tobacco (quit 1995)  . GERD (gastroesophageal reflux disease)     Social History   Social History  . Marital Status: Single    Spouse Name: N/A  . Number of Children: N/A  . Years of Education: N/A   Occupational History  . Not on file.   Social History Main Topics  . Smoking status: Former Smoker    Types: Cigarettes    Quit date: 02/28/1993  . Smokeless tobacco:  Never Used  . Alcohol Use: No  . Drug Use: No  . Sexual Activity: Not on file   Other Topics Concern  . Not on file   Social History Narrative    Current Outpatient Prescriptions on File Prior to Visit  Medication Sig  . aspirin EC 81 MG tablet Take 1 tablet (81 mg total) by mouth daily.  Marland Kitchen atorvastatin (LIPITOR) 40 MG tablet TAKE ONE TABLET BY MOUTH ONCE DAILY  . Insulin Pen Needle 31G X 5 MM MISC 1 Container by Does not apply route as needed. Supply QS for 1 time daily injection. Dx:  250.02  . LEVEMIR FLEXTOUCH 100 UNIT/ML Pen INJECT 15 UNITS SUBCUTANEOUSLY EVERY DAY.  Marland Kitchen lisinopril (PRINIVIL,ZESTRIL) 5 MG tablet Take 1 tablet (5 mg total) by mouth daily.  . metFORMIN (GLUCOPHAGE) 1000 MG tablet TAKE ONE TABLET BY MOUTH TWICE DAILY WITH  A  MEAL   No current facility-administered medications on file prior to visit.    Review of Systems Per HPI unless specifically indicated above     Objective:    BP 114/59 mmHg  Pulse 74  Temp(Src) 98.4 F (36.9 C) (Oral)  Ht  (1.676 m)  Wt 134 lb 8 oz (61.009 kg)  BMI 21.72 kg/m2  Wt Readings from Last 3 Encounters:  02/17/15 134 lb 8 oz (61.009 kg)  12/03/14 133 lb (60.328 kg)  10/01/14 132 lb 12.8 oz (60.238 kg)    Physical Exam  Constitutional: He is oriented to person, place,  and time. He appears well-developed and well-nourished. No distress.  Thin, well-appearing, comfortable  HENT:  Head: Normocephalic and atraumatic.  Mouth/Throat: Oropharynx is clear and moist.  Cardiovascular: Normal rate, regular rhythm, normal heart sounds and intact distal pulses.   No murmur heard. Pulmonary/Chest: Effort normal and breath sounds normal. No respiratory distress. He has no wheezes. He has no rales.  Neurological: He is alert and oriented to person, place, and time.  Skin: Skin is warm and dry. He is not diaphoretic.  Psychiatric: He has a normal mood and affect. His behavior is normal.  Nursing note and vitals  reviewed.  Results for orders placed or performed in visit on 02/17/15  BASIC METABOLIC PANEL WITH GFR  Result Value Ref Range   Sodium 139 135 - 146 mmol/L   Potassium 4.0 3.5 - 5.3 mmol/L   Chloride 100 98 - 110 mmol/L   CO2 26 20 - 31 mmol/L   Glucose, Bld 76 65 - 99 mg/dL   BUN 19 7 - 25 mg/dL   Creat 4.541.10 0.980.70 - 1.191.25 mg/dL   Calcium 9.6 8.6 - 14.710.3 mg/dL   GFR, Est African American 82 >=60 mL/min   GFR, Est Non African American 71 >=60 mL/min  Hepatitis C antibody  Result Value Ref Range   HCV Ab NEGATIVE NEGATIVE  HgB A1c  Result Value Ref Range   Hemoglobin A1C 10.6       Assessment & Plan:   Problem List Items Addressed This Visit    CKD stage 2 due to type 2 diabetes mellitus (HCC)    Consistent with CKD-II with resolved AKI. Improved Cr 1.3 to 1.10 today on BMET - Improve DM control       Diabetes mellitus type 2, uncontrolled, without complications (HCC) - Primary    Stable uncontrolled DM2 without improvement. Suspected non-adherence to dietary plan, infrequent meals, high carbs / sweets, mostly due to poor financial and homeless situation, but [facility] at [facility] and has nursing weekly checks. A1c 10.6 stable since 08/2014 CKD Stage II, improved with stable Cr now - Flu shot given  Plan:  1. Increase Levemir 25u to 27u daily, titrate instructions up to max 40u daily, continue Metformin 1000mg  BID - Check CBGs more regularly, need to bring in log to adjust further 2. Lifestyle Mods - Improved DM diet (dec carbs, inc vegs), again recommend starting regular exercise with walking 3. Check BMET follow Cr - improved 1.30 to 1.10 suspected baseline 5. Continue atorvastatin 40, ASA 81 daily 6. [facility] apt [facility] couldn't afford co-pay, follow-up when he can 7. RTC 3 months for A1c - if still not improved, follow-up pharm clinic      Relevant Orders   HgB A1c (Completed)   BASIC METABOLIC PANEL WITH GFR (Completed)    Other Visit Diagnoses     Need for hepatitis C screening test        Relevant Orders    Hepatitis C antibody (Completed)    Encounter for immunization           No orders of the defined types were placed in this encounter.      Follow up plan: Return in about 3 months (around 05/18/2015) for diabetes.  Saralyn PilarAlexander Karamalegos, DO [facility], PGY-3

## 2015-02-17 NOTE — Congregational Nurse Program (Signed)
Client in today past  his  follow  up  visit  With his PCP. Commended him  on his  follow  up. Review of PCP notes with  Client .  Notes  iIndicated that his  A1-_C  was 10.6   today . Counseled  regarding the result and what he needs to  work to  . Client was   given a sheet  On Know  Your  Numbers. We had  Reviewed  Earlier.  We reviewed his  Medication list  And  talked  About  Exercise at  Least  3 days  Per week , increasing his  Insulin and  Taking  His  Blood  Sugars.  [name]  Want to  Work on getting his  Blood  Sugar  Levels  Down  And  Takes in all suggestions  But  Likes to  Eat a certain way . We  Talked  About  Food preparation, ways to eliminate some fat in his  Cooking , how to  Make  Better  Selections . He  Understands the need for decrease of  sodas in his daily  diet. and  has really been working on life style  Behaviors.  Client states  he  has  [name]\to  move  out  by  03-06-15 and is  still  looking  for  housing .  Has one studio suite  that  may  work  but doesn't  want to  sign a year long  Lease.

## 2015-02-18 LAB — HEPATITIS C ANTIBODY: HCV Ab: NEGATIVE

## 2015-02-18 NOTE — Assessment & Plan Note (Signed)
Consistent with CKD-II with resolved AKI. Improved Cr 1.3 to 1.10 today on BMET - Improve DM control

## 2015-02-18 NOTE — Assessment & Plan Note (Signed)
Stable uncontrolled DM2 without improvement. Suspected non-adherence to dietary plan, infrequent meals, high carbs / sweets, mostly due to poor financial and homeless situation, but [facility] at [facility] and has nursing weekly checks. A1c 10.6 stable since 08/2014 CKD Stage II, improved with stable Cr now - Flu shot given  Plan:  1. Increase Levemir 25u to 27u daily, titrate instructions up to max 40u daily, continue Metformin 1000mg  BID - Check CBGs more regularly, need to bring in log to adjust further 2. Lifestyle Mods - Improved DM diet (dec carbs, inc vegs), again recommend starting regular exercise with walking 3. Check BMET follow Cr - improved 1.30 to 1.10 suspected baseline 5. Continue atorvastatin 40, ASA 81 daily 6. [facility] couldn't afford co-pay, follow-up when he can 7. RTC 3 months for A1c - if still not improved, follow-up pharm clinic

## 2015-05-11 ENCOUNTER — Telehealth: Payer: Self-pay | Admitting: Family Medicine

## 2015-05-11 DIAGNOSIS — IMO0001 Reserved for inherently not codable concepts without codable children: Secondary | ICD-10-CM

## 2015-05-11 DIAGNOSIS — Z794 Long term (current) use of insulin: Principal | ICD-10-CM

## 2015-05-11 DIAGNOSIS — E1165 Type 2 diabetes mellitus with hyperglycemia: Principal | ICD-10-CM

## 2015-05-11 MED ORDER — INSULIN DETEMIR 100 UNIT/ML FLEXPEN: 30.0000 [IU] | PEN_INJECTOR | Freq: Every day | SUBCUTANEOUS | Status: DC

## 2015-05-11 NOTE — Telephone Encounter (Signed)
Pt states he is having difficulties getting his Levermir. He states that he was changed to 30 Units but the RX still states 15 Units and he has run out. The Pharmacy is trying to charge him full price bc they say he is picking up the meds early. Please advise. [facility] [address]

## 2015-05-11 NOTE — Telephone Encounter (Signed)
Reviewed chart, last saw patient 01/2015, at that time he had titrated up to Levemir 25u and still titrating up as advised. The rx for Levemir pens was from 12/2014, and was not edited in 01/2015 to reflect his new increased insulin dose. [facility] [address] [facility], spoke with pharmacist, who confirmed that due to old rx reading 15u daily he was considered to be refilling it too early. Now with updated rx phoned in Levemir 30u daily, dispense 15mL or 5 pens at 3 mL per, +2 refills, it will be covered by insurance and cost is $40, it will be ready for pick-up.  Called patient to notify him rx can be picked up. He was appreciative, but did state that prior ins coverage was for $20 for 5 pens, and is unsure why price has increased. He will ask pharmacy.  Saralyn PilarAlexander Cyan Moultrie, DO [facility], PGY-3

## 2015-05-21 ENCOUNTER — Ambulatory Visit (INDEPENDENT_AMBULATORY_CARE_PROVIDER_SITE_OTHER): Payer: BLUE CROSS/BLUE SHIELD | Admitting: Family Medicine

## 2015-05-21 ENCOUNTER — Encounter: Payer: Self-pay | Admitting: Family Medicine

## 2015-05-21 VITALS — BP 134/60 | HR 79 | Temp 98.4°F | Ht 66.0 in | Wt 133.6 lb

## 2015-05-21 DIAGNOSIS — Z794 Long term (current) use of insulin: Secondary | ICD-10-CM

## 2015-05-21 DIAGNOSIS — E1165 Type 2 diabetes mellitus with hyperglycemia: Secondary | ICD-10-CM | POA: Diagnosis not present

## 2015-05-21 DIAGNOSIS — IMO0001 Reserved for inherently not codable concepts without codable children: Secondary | ICD-10-CM

## 2015-05-21 LAB — POCT GLYCOSYLATED HEMOGLOBIN (HGB A1C): HEMOGLOBIN A1C: 9

## 2015-05-21 MED ORDER — ATORVASTATIN CALCIUM 40 MG PO TABS: 40.0000 mg | ORAL_TABLET | Freq: Every day | ORAL | Status: DC

## 2015-05-21 MED ORDER — METFORMIN HCL 1000 MG PO TABS: ORAL_TABLET | ORAL | Status: DC

## 2015-05-21 NOTE — Patient Instructions (Addendum)
Thank you for coming in to clinic today.  1. For your Diabetes - It is much better controlled, A1c down to 9.0 from prior 10.6! Great job. Keep up the good work, try to eat healthy, stay active, exercise will help, and continue to take insulin as you are at 30 units (levemir) - You [facility], to ask if Lantus 30 units a day, would be cheaper option, please call me back to let me know what they prefer. - Check blood sugar 1-2x daily fasting in morning or 2 hour after eating lunch or dinner. If you are low on strips, you can check once daily is fine. - Stay well hydrated, don't skip meals  Refilled Atorvastatin and Metformin  Try to schedule appointment with [facility] in future if you can afford this follow-up for Annual Diabetic Eye Exam Address: [address], [place], [place] [address] Phone: [phone]  Diet Recommendations for Diabetes   Starchy (carb) foods include: Bread, rice, pasta, potatoes, corn, crackers, bagels, muffins, all baked goods.   Protein foods include: Meat, fish, poultry, eggs, dairy foods, and beans such as pinto and kidney beans (beans also provide carbohydrate).   1. Eat at least 3 meals and 1-2 snacks per day. Never go more than 4-5 hours while awake without eating.  2. Limit starchy foods to TWO per meal and ONE per snack. ONE portion of a starchy  food is equal to the following:   - ONE slice of bread (or its equivalent, such as half of a hamburger bun).   - 1/2 cup of a "scoopable" starchy food such as potatoes or rice.   - 1 OUNCE (28 grams) of starchy snack foods such as crackers or pretzels (look on label).   - 15 grams of carbohydrate as shown on food label.  3. Both lunch and dinner should include a protein food, a carb food, and vegetables.   - Obtain twice as many veg's as protein or carbohydrate foods for both lunch and dinner.   - Try to keep frozen veg's on hand for a quick vegetable serving.     - Fresh or frozen  veg's are best.  4. Breakfast should always include protein.    Work on your goal of improving exercise! Regular walking 15-1720mins even 1-2 days a week will help get you get motivated and feel better! The goal is to reduce sugar, not to lose weight!  Please schedule follow-up with Dr. Althea CharonKaramalegos in 3 months for Diabetes follow-up, or if blood sugars doing very well, you can come back in 5 to 6 months for Annual Physical.  If you have any other questions or concerns, please feel free to call the clinic to contact me. You may also schedule an earlier appointment if necessary.  However, if your symptoms get significantly worse, please go to the Emergency Department to seek immediate medical attention.  Saralyn PilarAlexander Karamalegos, DO [facility]

## 2015-05-21 NOTE — Progress Notes (Signed)
Subjective:    Patient ID: Jordan Rodriguez, male    DOB: [birth year], 65 y.o.   MRN: [id number]  Jordan Rodriguez is a 65 y.o. male presenting on 05/21/2015 for Diabetes  HPI  CHRONIC DM, Type 2: - Last visit 01/2015 for DM. Since then he has done overall well. See 3/13 telephone note regarding cost of levemir, now resolved with new rx. - Improved DM diet, occasional non-adherence with sweets CBGs- Avg 120-150, low 56 (mostly asymptomatic), high 282, check 1-2x daily (brought glucometer) Meds: Levemir 30u daily (he had lowered dose to 20u while waiting on rx refill to extend his supply), Metformin-XR  x2 tabs BID (for  BID) - ran out yesterday Reports good compliance. Tolerating well w/o side-effects Currently on ACEi Denies polyuria, visual changes, numbness or tingling, nausea vomiting  Social Hx: No longer staying in shelter. He is living in an apartment now. 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 Smoking status: Former Smoker    Types: Cigarettes    Quit date: 02/28/1993  . Smokeless tobacco: Never Used  . Alcohol Use: No    Review of Systems Per HPI unless specifically indicated above     Objective:    BP 134/60 mmHg  Pulse 79  Temp(Src) 98.4 F (36.9 C) (Oral)  Ht  (1.676 m)  Wt 133 lb 9.6 oz (60.601 kg)  BMI 21.57 kg/m2  Wt Readings from Last 3 Encounters:  05/21/15 133 lb 9.6 oz (60.601 kg)  02/17/15 134 lb 8 oz (61.009 kg)  12/03/14 133 lb (60.328 kg)    Physical Exam  Constitutional: He appears well-developed and well-nourished. No distress.  Well-appearing, comfortable, cooperative  HENT:  Mouth/Throat: Oropharynx is clear and moist.  Cardiovascular: Normal rate, regular rhythm, normal heart sounds and intact distal pulses.   No murmur heard. Pulmonary/Chest: Effort normal and breath sounds normal. No respiratory distress. He has no wheezes. He has no rales.    Musculoskeletal: He exhibits no edema.  Neurological: He is alert.  Skin: Skin is warm and dry. He is not diaphoretic.  Nursing note and vitals reviewed.      Assessment & Plan:   Problem List Items Addressed This Visit    Diabetes mellitus type 2, uncontrolled, without complications (HCC) - Primary    Significantly improved DM2 control, A1c 10.6 to 9.0. Improved diet, adhering to insulin. CKD Stage II, improved with stable Cr now  Plan:  1. Continue Levemir 30u daily - patient may contact [employer] to inquire about cost of Lantus to see if preferred. Will follow-up. 2. Refilled Metformin XR  BID 3. Refilled atorvastatin 40 - for DM, not hyperlipidemia 4. Continue improved DM diet 5. Not due for labs chemistry, lipids until 09/2015 6. Due DM eye - patient unable to afford co-pay at Dr Nile Riggs, follow-up when he can 7. RTC 3 months for A1c, may follow-up in 5 months for physical and A1c if improved to save co-pay      Relevant Medications   metFORMIN (GLUCOPHAGE) 1000 MG tablet   atorvastatin (LIPITOR) 40 MG tablet   Other Relevant Orders   HgB A1c (Completed)      Meds ordered this encounter  Medications  . metFORMIN (GLUCOPHAGE) 1000 MG tablet    Sig: TAKE ONE TABLET BY MOUTH TWICE DAILY WITH  A  MEAL    Dispense:  180 tablet    Refill:  5  . atorvastatin (LIPITOR) 40  MG tablet    Sig: Take 1 tablet (40 mg total) by mouth daily.    Dispense:  30 tablet    Refill:  5    Follow up plan: Return in about 3 months (around 08/21/2015) for diabetes.  Saralyn PilarAlexander Adajah Cocking, DO [facility], PGY-3

## 2015-05-21 NOTE — Assessment & Plan Note (Addendum)
Significantly improved DM2 control, A1c 10.6 to 9.0. Improved diet, adhering to insulin. CKD Stage II, improved with stable Cr now  Plan:  1. Continue Levemir 30u daily - patient may contact [employer] to inquire about cost of Lantus to see if preferred. Will follow-up. 2. Refilled Metformin XR 1000mg  BID 3. Refilled atorvastatin 40 - for DM, not hyperlipidemia 4. Continue improved DM diet 5. Not due for labs chemistry, lipids until 09/2015 6. Due DM eye - patient unable to afford co-pay at Dr [facility], follow-up when he can 7. RTC 3 months for A1c, may follow-up in 5 months for physical and A1c if improved to save co-pay

## 2015-07-01 ENCOUNTER — Telehealth: Payer: Self-pay | Admitting: *Deleted

## 2015-07-01 DIAGNOSIS — Z794 Long term (current) use of insulin: Principal | ICD-10-CM

## 2015-07-01 DIAGNOSIS — E1165 Type 2 diabetes mellitus with hyperglycemia: Principal | ICD-10-CM

## 2015-07-01 DIAGNOSIS — IMO0001 Reserved for inherently not codable concepts without codable children: Secondary | ICD-10-CM

## 2015-07-01 MED ORDER — GLUCOSE BLOOD VI STRP: ORAL_STRIP | Status: DC

## 2015-07-01 MED ORDER — INSULIN PEN NEEDLE 31G X 5 MM MISC: Status: DC

## 2015-07-01 MED ORDER — ACCU-CHEK SOFTCLIX LANCET DEV MISC: Status: DC

## 2015-07-01 NOTE — Telephone Encounter (Signed)
Patient has chronic DM2 with A1c not at goal. He is insulin dependent, checks glucose 1-3 x daily, has Accu-chek glucometer. Ordered testing supply refills, test glucose up to 3 times daily. Accu-Chek Aviva VI strip Accu-Chek Softclix Lancets Pen needles 31g  X 5mm  Jordan PilarAlexander Ryker [place], DO [facility], PGY-3

## 2015-07-01 NOTE — Telephone Encounter (Signed)
Patient states he needs refills on testing supplies [facility]. Needled lancets, test strips, and pen needles for accu-check meter

## 2015-10-06 ENCOUNTER — Other Ambulatory Visit: Payer: Self-pay | Admitting: Family Medicine

## 2015-10-06 DIAGNOSIS — IMO0001 Reserved for inherently not codable concepts without codable children: Secondary | ICD-10-CM

## 2015-10-06 DIAGNOSIS — E1165 Type 2 diabetes mellitus with hyperglycemia: Principal | ICD-10-CM

## 2015-10-06 DIAGNOSIS — Z794 Long term (current) use of insulin: Principal | ICD-10-CM

## 2015-10-13 ENCOUNTER — Encounter: Payer: Self-pay | Admitting: Internal Medicine

## 2015-10-13 ENCOUNTER — Ambulatory Visit (INDEPENDENT_AMBULATORY_CARE_PROVIDER_SITE_OTHER): Payer: BLUE CROSS/BLUE SHIELD | Admitting: Internal Medicine

## 2015-10-13 VITALS — BP 106/62 | HR 69 | Temp 98.4°F | Wt 132.0 lb

## 2015-10-13 DIAGNOSIS — Z794 Long term (current) use of insulin: Secondary | ICD-10-CM

## 2015-10-13 DIAGNOSIS — E1165 Type 2 diabetes mellitus with hyperglycemia: Secondary | ICD-10-CM

## 2015-10-13 DIAGNOSIS — IMO0001 Reserved for inherently not codable concepts without codable children: Secondary | ICD-10-CM

## 2015-10-13 LAB — POCT GLYCOSYLATED HEMOGLOBIN (HGB A1C): HEMOGLOBIN A1C: 8.7

## 2015-10-13 MED ORDER — ATORVASTATIN CALCIUM 40 MG PO TABS: 40.0000 mg | ORAL_TABLET | Freq: Every day | ORAL | 5 refills | Status: DC

## 2015-10-13 MED ORDER — "INSULIN SYRINGE 31G X 5/16"" 1 ML MISC": 1.0000 | Freq: Two times a day (BID) | 99 refills | Status: DC

## 2015-10-13 MED ORDER — ACCU-CHEK SOFTCLIX LANCET DEV MISC: 5 refills | Status: AC

## 2015-10-13 MED ORDER — GLUCOSE BLOOD VI STRP: ORAL_STRIP | 11 refills | Status: DC

## 2015-10-13 MED ORDER — ASPIRIN EC 81 MG PO TBEC: 81.0000 mg | DELAYED_RELEASE_TABLET | Freq: Every day | ORAL | 3 refills | Status: DC

## 2015-10-13 MED ORDER — INSULIN NPH (HUMAN) (ISOPHANE) 100 UNIT/ML ~~LOC~~ SUSP: 30.0000 [IU] | Freq: Two times a day (BID) | SUBCUTANEOUS | 1 refills | Status: DC

## 2015-10-13 MED ORDER — METFORMIN HCL 1000 MG PO TABS: ORAL_TABLET | ORAL | 5 refills | Status: DC

## 2015-10-13 NOTE — Assessment & Plan Note (Signed)
Uncontrolled. A1C 8.7 (down from 9 4 months ago). Difficulty affording Levemir at $40 per month.  - Change to NPH ($25 a month). Continue 30U for now, but increase to BID (equivalent to 30U Levemir qd).  - Patient just picked up full vial of Levemir. Can continue current dose of Levemir to complete vial since cannot afford more insulin right now, but will pick up NPH as soon as Levemir is out. Instructed to schedule appointment for follow-up after taking NPH for two weeks. Also instructed to measure blood sugar twice a day (says he is unable to afford test strips if measuring three times a day), write down measurements and bring to next appointment.  - Will likely need to increase insulin dose at next appointment, however do not want to change insulins and dose at same time

## 2015-10-13 NOTE — Progress Notes (Signed)
poc

## 2015-10-13 NOTE — Progress Notes (Signed)
   Subjective:    Patient ID: Jordan GoltzJoseph E Toves, male    DOB: 10/03/1950, 65 y.o.   MRN: [id number]  HPI  Patient presents for diabetes f/u.   Type II DM Patient reporting difficulty obtaining medication due to cost. Is currently taking Levemir 30 U qd and metformin 1000 mg BID. Last measured blood sugar 1.5 weeks ago because ran out of test strips and unable to afford new ones. When measuring blood sugars, typically getting readings between 120-190. Is unsure about hypoglycemic episodes. Reports sometimes feels like his blood sugar might be low, so he eats something, but does not measure blood sugar at that time. Denies missing medication doses when he is able to afford it, however sometimes has to go about a week without medication before he can afford to buy it again. Denies numbness or tingling. Has not seen an ophthalmologist in >5 years because he cannot afford the copay.   Review of Systems See HPI.     Objective:   Physical Exam  Constitutional: He is oriented to person, place, and time. He appears well-developed and well-nourished. No distress.  HENT:  Head: Normocephalic and atraumatic.  Eyes: EOM are normal. Pupils are equal, round, and reactive to light. Right eye exhibits no discharge. Left eye exhibits no discharge.  Pulmonary/Chest: Effort normal. No respiratory distress.  Musculoskeletal: Normal range of motion.  Neurological: He is alert and oriented to person, place, and time.  Skin: Skin is warm and dry.  Psychiatric: He has a normal mood and affect. His behavior is normal.      Assessment & Plan:  Diabetes mellitus type 2, uncontrolled, without complications Uncontrolled. A1C 8.7 (down from 9 4 months ago). Difficulty affording Levemir at $40 per month.  - Change to NPH ($25 a month). Continue 30U for now, but increase to BID (equivalent to 30U Levemir qd).  - Patient just picked up full vial of Levemir. Can continue current dose of Levemir to complete vial since  cannot afford more insulin right now, but will pick up NPH as soon as Levemir is out. Instructed to schedule appointment for follow-up after taking NPH for two weeks. Also instructed to measure blood sugar twice a day (says he is unable to afford test strips if measuring three times a day), write down measurements and bring to next appointment.  - Will likely need to increase insulin dose at next appointment, however do not want to change insulins and dose at same time  Tarri AbernethyAbigail J Netta Fodge, MD, MPH PGY-2 [facility] Pager [phone]

## 2015-10-13 NOTE — Patient Instructions (Signed)
It was nice meeting you today Ms. Perl.   I have changed your insulin today to make it a little cheaper. You will now be taking insulin NPH (instead of Levemir). You will still take 30 units, but you will take it twice a day. You will have to draw the appropriate amount (listed on the prescription bottle) into a syringe, then administer the insulin. [facility] able to help you if you have questions. NPH will cost $25 for one month's supply.   It is important to measure your blood sugar to make sure you are at a good dose of insulin. Please check your blood sugar three times a day, and write your measurements down in a book. Please bring this with you to your next appointment.   I would like to see you back in two weeks to check on your blood sugars.   If you have any questions or concerns, please feel free to call the clinic.   Be well,  Dr. Natale MilchLancaster

## 2015-10-14 ENCOUNTER — Telehealth: Payer: Self-pay | Admitting: *Deleted

## 2015-10-14 NOTE — Telephone Encounter (Signed)
Prior Authorization received from [facility] for Humulin-N. Formulary preferred is Novolog/Novolin-N.  Please advise.    Clovis PuMartin, Schylar Wuebker L, RN

## 2015-10-15 MED ORDER — INSULIN ASPART 100 UNIT/ML ~~LOC~~ SOLN: 30.0000 [IU] | Freq: Two times a day (BID) | SUBCUTANEOUS | 11 refills | Status: DC

## 2015-10-22 ENCOUNTER — Telehealth: Payer: Self-pay | Admitting: *Deleted

## 2015-10-22 NOTE — Telephone Encounter (Signed)
Received fax from [facility] requesting to change Humulin N to Novolin N.  Novolin N is preferred per patient's insurance.  Please send in new Rx for Novolin Nelta NumbersN.  Martin, Tamika L, RN

## 2015-10-30 MED ORDER — INSULIN NPH (HUMAN) (ISOPHANE) 100 UNIT/ML ~~LOC~~ SUSP: 30.0000 [IU] | Freq: Two times a day (BID) | SUBCUTANEOUS | 11 refills | Status: DC

## 2015-10-30 NOTE — Telephone Encounter (Signed)
Received another PA for Humulin-N. Preferred is Novolin-N.  Rx changed to Novolin-N.  Clovis PuMartin, Tamika L, RN

## 2015-12-14 ENCOUNTER — Telehealth: Payer: Self-pay | Admitting: Internal Medicine

## 2015-12-14 NOTE — Telephone Encounter (Signed)
Pt is calling because he was start on insulin. He is now out and will not be able to get any until Thursday of this week. Will that be okay? He is seeing the doctor on Wednesday. Do we have any samples or will it be okay for him to wait until then. jw

## 2015-12-16 ENCOUNTER — Ambulatory Visit (INDEPENDENT_AMBULATORY_CARE_PROVIDER_SITE_OTHER): Payer: BLUE CROSS/BLUE SHIELD | Admitting: Internal Medicine

## 2015-12-16 ENCOUNTER — Encounter: Payer: Self-pay | Admitting: Internal Medicine

## 2015-12-16 VITALS — BP 125/70 | HR 66 | Temp 97.7°F | Ht 66.0 in | Wt 137.0 lb

## 2015-12-16 DIAGNOSIS — Z79899 Other long term (current) drug therapy: Secondary | ICD-10-CM | POA: Diagnosis not present

## 2015-12-16 DIAGNOSIS — IMO0001 Reserved for inherently not codable concepts without codable children: Secondary | ICD-10-CM

## 2015-12-16 DIAGNOSIS — Z794 Long term (current) use of insulin: Secondary | ICD-10-CM | POA: Diagnosis not present

## 2015-12-16 DIAGNOSIS — E1165 Type 2 diabetes mellitus with hyperglycemia: Secondary | ICD-10-CM | POA: Diagnosis not present

## 2015-12-16 MED ORDER — INSULIN NPH (HUMAN) (ISOPHANE) 100 UNIT/ML ~~LOC~~ SUSP: 30.0000 [IU] | Freq: Two times a day (BID) | SUBCUTANEOUS | 11 refills | Status: DC

## 2015-12-16 MED ORDER — ATORVASTATIN CALCIUM 40 MG PO TABS: 40.0000 mg | ORAL_TABLET | Freq: Every day | ORAL | 3 refills | Status: DC

## 2015-12-16 NOTE — Progress Notes (Signed)
   Subjective:    Patient ID: Jordan Rodriguez, male    DOB: 04/28/1950, 65 y.o.   MRN: [id number]  HPI  Patient presents for DM f/u.  Type II DM Patient last seen about a month ago and is returning today for follow up. Has been without insulin for the past four days, as he has difficulty affording his medications. Has been taking Novolin 30U BID. Reports current amount prescribed only lasts him about two weeks. Has been recording blood sugars three times a day as encouraged. Denies any hypoglycemic symptoms. Thinks that he is not exercising enough, and that is why his blood sugar remains high at times. Is compliant with his medication except when he runs out due to financial issues.   Review of Systems See HPI.     Objective:   Physical Exam  Constitutional: He is oriented to person, place, and time. He appears well-developed and well-nourished. No distress.  HENT:  Head: Normocephalic and atraumatic.  Eyes: Conjunctivae and EOM are normal. Right eye exhibits no discharge. Left eye exhibits no discharge.  Pulmonary/Chest: Effort normal. No respiratory distress.  Neurological: He is alert and oriented to person, place, and time.  Psychiatric: He has a normal mood and affect. His behavior is normal.      Assessment & Plan:  Diabetes mellitus type 2, uncontrolled, without complications Currently taking Novolin 30U BID. Per patient's records (has been recording blood sugar three times a day as encouraged every single day since our last appointment), blood sugar fluctuates. Is frequently fairly low in AM (lowest around 60), but also low at bedtime (lowest 40). Usually elevated, often in 200s around lunch and dinner. Frequently has issues affording medication, even with Novolin. Currently reports that he will not be able to afford insulin again until next Thursday, as he has to pay his utilities bill today. Could likely benefit from increasing AM insulin higher than 30U as patient with  frequently elevated lunchtime blood sugars, however with such fluctuating readings, think patient more appropriate for follow-up with Dr. Raymondo BandKoval for insulin adjustment.  - Patient to schedule appt with Dr. Raymondo BandKoval before leaving today. Encouraged to continue recording blood sugars as he has been and to bring to appt with Dr. Raymondo BandKoval.  - As patient cannot afford more insulin for the next week, provided with two samples of Lantus today. Per NPH to Lantus conversion (80% of daily NPH), patient to begin 24U of Lantus BID. When out of samples, will pick up Novolin prescription and resume 30U BID, unless told otherwise by Dr. Raymondo BandKoval - Vitamin B12 checked today due to long-term metformin use. Will f/u.   Tarri AbernethyAbigail J [place], MD, MPH PGY-2 [facility] Pager [phone]

## 2015-12-16 NOTE — Patient Instructions (Signed)
It was nice seeing you again today [name]!  Please begin taking Lantus 24 units twice a day. Once your free sample runs out, you can refill your NPH (Novolin) and start taking 30 units twice a day again.   It is very important to keep your appointment with Dr. Raymondo BandKoval (the pharmacist). Please continue to write down your blood sugars as you have been and bring this to your appointment. This will help Dr. Raymondo BandKoval determine which dose of insulin you need to be on to keep your diabetes under control.   If you have any questions or concerns, please feel free to call the clinic.   Be well,  Dr. Natale MilchLancaster

## 2015-12-16 NOTE — Assessment & Plan Note (Signed)
Currently taking Novolin 30U BID. Per patient's records (has been recording blood sugar three times a day as encouraged every single day since our last appointment), blood sugar fluctuates. Is frequently fairly low in AM (lowest around 60), but also low at bedtime (lowest 40). Usually elevated, often in 200s around lunch and dinner. Frequently has issues affording medication, even with Novolin. Currently reports that he will not be able to afford insulin again until next Thursday, as he has to pay his utilities bill today. Could likely benefit from increasing AM insulin higher than 30U as patient with frequently elevated lunchtime blood sugars, however with such fluctuating readings, think patient more appropriate for follow-up with Dr. Raymondo BandKoval for insulin adjustment.  - Patient to schedule appt with Dr. Raymondo BandKoval before leaving today. Encouraged to continue recording blood sugars as he has been and to bring to appt with Dr. Raymondo BandKoval.  - As patient cannot afford more insulin for the next week, provided with two samples of Lantus today. Per NPH to Lantus conversion (80% of daily NPH), patient to begin 24U of Lantus BID. When out of samples, will pick up Novolin prescription and resume 30U BID, unless told otherwise by Dr. Raymondo BandKoval - Vitamin B12 checked today due to long-term metformin use. Will f/u.

## 2015-12-21 ENCOUNTER — Ambulatory Visit: Payer: BLUE CROSS/BLUE SHIELD | Admitting: Pharmacist

## 2015-12-24 ENCOUNTER — Encounter: Payer: Self-pay | Admitting: Pharmacist

## 2015-12-24 ENCOUNTER — Ambulatory Visit (INDEPENDENT_AMBULATORY_CARE_PROVIDER_SITE_OTHER): Payer: BLUE CROSS/BLUE SHIELD | Admitting: Pharmacist

## 2015-12-24 VITALS — BP 140/66 | HR 73 | Wt 140.8 lb

## 2015-12-24 DIAGNOSIS — E785 Hyperlipidemia, unspecified: Secondary | ICD-10-CM | POA: Insufficient documentation

## 2015-12-24 DIAGNOSIS — E7800 Pure hypercholesterolemia, unspecified: Secondary | ICD-10-CM | POA: Diagnosis not present

## 2015-12-24 DIAGNOSIS — IMO0001 Reserved for inherently not codable concepts without codable children: Secondary | ICD-10-CM

## 2015-12-24 DIAGNOSIS — Z79899 Other long term (current) drug therapy: Secondary | ICD-10-CM

## 2015-12-24 DIAGNOSIS — Z794 Long term (current) use of insulin: Secondary | ICD-10-CM | POA: Diagnosis not present

## 2015-12-24 DIAGNOSIS — E1165 Type 2 diabetes mellitus with hyperglycemia: Secondary | ICD-10-CM | POA: Diagnosis not present

## 2015-12-24 LAB — BASIC METABOLIC PANEL
BUN: 22 mg/dL (ref 7–25)
CHLORIDE: 103 mmol/L (ref 98–110)
CO2: 25 mmol/L (ref 20–31)
CREATININE: 1.21 mg/dL (ref 0.70–1.25)
Calcium: 9.7 mg/dL (ref 8.6–10.3)
Glucose, Bld: 52 mg/dL — ABNORMAL LOW (ref 65–99)
Potassium: 4.6 mmol/L (ref 3.5–5.3)
Sodium: 139 mmol/L (ref 135–146)

## 2015-12-24 LAB — LDL CHOLESTEROL, DIRECT: LDL DIRECT: 49 mg/dL (ref <130)

## 2015-12-24 LAB — VITAMIN B12: VITAMIN B 12: 358 pg/mL (ref 200–1100)

## 2015-12-24 MED ORDER — GLUCOSE BLOOD VI STRP: ORAL_STRIP | 3 refills | Status: DC

## 2015-12-24 MED ORDER — INSULIN LISPRO 100 UNIT/ML (KWIKPEN): PEN_INJECTOR | SUBCUTANEOUS | 11 refills | Status: DC

## 2015-12-24 MED ORDER — INSULIN ASPART 100 UNIT/ML FLEXPEN: PEN_INJECTOR | SUBCUTANEOUS | 0 refills | Status: DC

## 2015-12-24 MED ORDER — INSULIN GLARGINE 100 UNIT/ML SOLOSTAR PEN: 40.0000 [IU] | PEN_INJECTOR | Freq: Every morning | SUBCUTANEOUS | 0 refills | Status: DC

## 2015-12-24 MED ORDER — INSULIN GLARGINE 100 UNIT/ML SOLOSTAR PEN: 40.0000 [IU] | PEN_INJECTOR | Freq: Every morning | SUBCUTANEOUS | 11 refills | Status: DC

## 2015-12-24 NOTE — Progress Notes (Signed)
Patient ID: Jordan Rodriguez, male   DOB: 05/09/1950, 65 y.o.   MRN: [id number] Reviewed: Agree with Dr. Koval's documentation and management. 

## 2015-12-24 NOTE — Patient Instructions (Addendum)
It was nice to see you today!  Your blood sugar is running too low at times, therefore we will adjust your insulin.   Lantus - Decrease to 40 units once daily in the morning.   Humalog - start taking 8 units prior to your largest meal of the day (usually your evening meal).   Use the Novolog sample pen at the same dose.   Call the clinic if you have multiple low blood sugars. We will call you in 1 week to follow-up.   Please call the clinic to schedule an appointment with Dr. Raymondo BandKoval before Thanksgiving.

## 2015-12-24 NOTE — Progress Notes (Signed)
    S:    Patient arrives in good spirits without assistance complaining of nausea this morning.  Presents for diabetes evaluation, education, and management at the request of Dr. Natale MilchLancaster. Patient was referred on 12/24/2015.  Patient was last seen by Primary Care Provider on 12/16/2015.   Patient reports managing diabetes with diet alone in the past, but has been managing with insulin since 1999.   Patient reports adherence with medications when he can afford them. He has drug insurance coverage, however his co-pays can be too expensive for him at a time. He understands the importance of his medications.  Current diabetes medications include: Lantus 24 units bid, Metformin 1000 mg bid Current hypertension medications include: none  Patient reports hypoglycemic events. He has experienced 4-5 episodes since 12/18/2015.   Patient reported dietary habits: Eats 3 meals/day Breakfast: Frosted Flakes - medium to large bowl Lunch: Sandwich, applesauce, fruit cups (not SF) Dinner: rice (1/3 of plate), meat, vegetable (usually greens), piece of bread **Patient self-adjusts size of carbs for dinner when pre-meal sugar is low** Snacks: P&B sandwich, fruit cups (especially when sugar is low in middle of night) Drinks: water, SF drink mix   Patient reports nocturia. Twice a night. Patient works 3rd shift on weekends, and therefore is sometimes awake during the week at night from sleep adjustments.  Patient denies neuropathy. Except when sugars are low. Patient denies visual changes. Patient reports self foot exams.    O:  Lab Results  Component Value Date   HGBA1C 8.7 10/13/2015   Vitals:   12/24/15 1108  BP: 140/66  Pulse: 73   Home fasting CBG: 48-123 (pre-breakfast), 68-266 (pre-lunch), 59-103 (pre-dinner), 59-151 (bedtime)  10 year ASCVD risk: 16.3%.  A/P: Diabetes longstanding currently uncontrolled with A1c 8.7%, goal < 7%. Patient reports hypoglycemic events and is able to  verbalize appropriate hypoglycemia management plan. Patient reports adherence with medication. Control is suboptimal due to medication affordability issues, poor diet choices, and lack of exercise. Decreased dose of basal insulin Lantus (insulin glargine) to 40 units every morning. Started rapid insulin Humalog (insulin lispro) at 8 units daily with largest meal (typically supper). Samples of Lantus and Novolog (substituted for Humalog) were given today as patient reported he would be unable to afford his medications until 11/8 (payday). Next step would be to consider the addition of a GLP-1 agonist. Patient stated that his goals would be to increase his exercise and have better eating habits to improve his blood sugars. Will assess his progress with these goals at the next appointment. LDL, vitamin B-12, bmet, and urine microalbumin levels ordered today. Next A1C anticipated Nov 2017.    ASCVD risk greater than 7.5%. Continued Aspirin 81 mg and Continued atorvastatin 40 mg.   Blood pressure was elevated today at 140/66. Previously BP has been normal without any medications. Continue to monitor and consider addition of a BP agent if remains elevated at next visit.   Written patient instructions provided.  Total time in face to face counseling 45 minutes.  Follow-up phone call in 1 week; patient instructed to call the clinic with multiple symptomatic hypoglycemic episodes. Follow up in Pharmacist Clinic Visit in 3-4 weeks.   Patient seen with Dr. Raymondo BandKoval, Allie BossierApryl Anderson (PharmD, PGY1 pharmacy resident), and Alphonzo Severanceyan Ragan [facility](P3 pharmacy student).

## 2015-12-24 NOTE — Assessment & Plan Note (Signed)
Diabetes longstanding currently uncontrolled with A1c 8.7%, goal < 7%. Patient reports hypoglycemic events and is able to verbalize appropriate hypoglycemia management plan. Patient reports adherence with medication. Control is suboptimal due to medication affordability issues, poor diet choices, and lack of exercise. Decreased dose of basal insulin Lantus (insulin glargine) to 40 units every morning. Started rapid insulin Humalog (insulin lispro) at 8 units daily with largest meal (typically supper). Samples of Lantus and Novolog (substituted for Humalog) were given today as patient reported he would be unable to afford his medications until 11/8 (payday). Next step would be to consider the addition of a GLP-1 agonist. Patient stated that his goals would be to increase his exercise and have better eating habits to improve his blood sugars. Will assess his progress with these goals at the next appointment. LDL, vitamin B-12, bmet, and urine microalbumin levels ordered today. Next A1C anticipated Nov 2017.

## 2015-12-25 ENCOUNTER — Telehealth: Payer: Self-pay | Admitting: Pharmacist

## 2015-12-25 LAB — MICROALBUMIN, URINE: Microalb, Ur: 2 mg/dL

## 2015-12-25 NOTE — Telephone Encounter (Signed)
Left message of returning call to follow up on visit from 10/26 lab work and reassess how he was doing.  Informed I would attempt phone f/u again on Monday.  Plan follow up at that time.

## 2015-12-28 ENCOUNTER — Telehealth: Payer: Self-pay | Admitting: Pharmacist

## 2015-12-28 ENCOUNTER — Telehealth: Payer: Self-pay | Admitting: *Deleted

## 2015-12-28 MED ORDER — GLUCOSE BLOOD VI STRP: ORAL_STRIP | 3 refills | Status: DC

## 2015-12-28 NOTE — Telephone Encounter (Signed)
Attempted to reach patient to follow-up about low BG value from lab last week and home BG readings. Left message for patient to return call.   Call conducted by Allie BossierApryl Anderson, PharmD, PGY1 Pharmacy Resident

## 2015-12-28 NOTE — Telephone Encounter (Signed)
Received fax from [facility] stating patient's insurance does not cover Accu-chek aviva plus test strips.  Order given by Dr. McDiarmid to change to test strips covered by patient's insurance.  Patient's insurance will cover [employer] supplies.  Clovis PuMartin, Tamika L, RN

## 2015-12-29 ENCOUNTER — Ambulatory Visit (INDEPENDENT_AMBULATORY_CARE_PROVIDER_SITE_OTHER): Payer: BLUE CROSS/BLUE SHIELD | Admitting: Pharmacist

## 2015-12-29 DIAGNOSIS — E1165 Type 2 diabetes mellitus with hyperglycemia: Secondary | ICD-10-CM

## 2015-12-29 DIAGNOSIS — IMO0001 Reserved for inherently not codable concepts without codable children: Secondary | ICD-10-CM

## 2015-12-29 DIAGNOSIS — Z794 Long term (current) use of insulin: Secondary | ICD-10-CM

## 2015-12-29 LAB — GLUCOSE, CAPILLARY: GLUCOSE-CAPILLARY: 145 mg/dL — AB (ref 65–99)

## 2015-12-29 MED ORDER — INSULIN GLARGINE 100 UNIT/ML SOLOSTAR PEN: 30.0000 [IU] | PEN_INJECTOR | Freq: Every morning | SUBCUTANEOUS | Status: DC

## 2015-12-29 NOTE — Progress Notes (Signed)
    S:    Patient arrives in follow-up of phone message RE low blood sugar with lab test at last visit.  He states his phone is out of minutes and he is unable to receive phone calls.  He states he is able to receive text messages however on his phone.  He pesents for diabetes evaluation.    He reports he is out of food and will NOT get food stamps until the 15th of November.  He reports he continues to experience low blood sugar readings despite the recent lowering of his lantus dose from 24 units BID to 40 once daily.   Patient reports adherence with medications.  Current diabetes medications include: lantus and humalog.    Patient reports continued and multiple hypoglycemic events.  Patient reported dietary habits: Eats erratically quantity and number of meals/day   O:  Lab Results  Component Value Date   HGBA1C 8.7 10/13/2015    Home fasting CBG: Multiple Readings < 80 and symptoms with readings of 40-50.   CBG with home monitor was 40 within 30 minutes of arrival.   CBG in office after providing "protein bar supplement" : > 100   A/P: Diabetes longstanding who is currently experiencing multiple low readings and hypoglycemia with recent switch to Lantus. Patient is able to verbalize appropriate hypoglycemia management plan. However food insecurity is a major risk.  Patient reports adherence with medication. Control is suboptimal due to insulin regimen change AND food insecurity. Decreased dose of basal insulin Lantus (insulin glargine) from 40 to 30 units daily.  Continued rapid insulin Novolog (insulin aspart) at a dose of 8 units prior to largest meal of the day.  Provided ~ 1 week supply of food from food pantry.  Written patient instructions provided.  Total time in face to face counseling 20 minutes.  Follow up in Pharmacist Clinic Visit in 1 week (Thrusday).

## 2015-12-29 NOTE — Patient Instructions (Signed)
Decrease your lantus to 30 units once daily.   Continue your meal time insulin of 8 units once daily with your large meal at this time.   Next follow up in 1 week (Thursday in Rx Clinic).

## 2015-12-29 NOTE — Assessment & Plan Note (Signed)
Diabetes longstanding who is currently experiencing multiple low readings and hypoglycemia with recent switch to Lantus. Patient is able to verbalize appropriate hypoglycemia management plan. However food insecurity is a major risk.  Patient reports adherence with medication. Control is suboptimal due to insulin regimen change AND food insecurity. Decreased dose of basal insulin Lantus (insulin glargine) from 40 to 30 units daily.  Continued rapid insulin Novolog (insulin aspart) at a dose of 8 units prior to largest meal of the day.  Provided ~ 1 week supply of food from food pantry.  Written patient instructions provided.  Total time in face to face counseling 20 minutes.  Follow up in Pharmacist Clinic Visit in 1 week (Thrusday).

## 2015-12-30 ENCOUNTER — Encounter: Payer: Self-pay | Admitting: Pharmacist

## 2015-12-30 NOTE — Progress Notes (Signed)
Patient ID: Jordan Rodriguez, male   DOB: 12/07/1950, 65 y.o.   MRN: [id number] Reviewed: Agree with Dr. Koval's documentation and management. 

## 2016-01-07 ENCOUNTER — Ambulatory Visit (INDEPENDENT_AMBULATORY_CARE_PROVIDER_SITE_OTHER): Payer: BLUE CROSS/BLUE SHIELD | Admitting: Pharmacist

## 2016-01-07 ENCOUNTER — Encounter: Payer: Self-pay | Admitting: Pharmacist

## 2016-01-07 ENCOUNTER — Encounter: Payer: Self-pay | Admitting: Licensed Clinical Social Worker

## 2016-01-07 DIAGNOSIS — Z794 Long term (current) use of insulin: Secondary | ICD-10-CM

## 2016-01-07 DIAGNOSIS — E1165 Type 2 diabetes mellitus with hyperglycemia: Secondary | ICD-10-CM

## 2016-01-07 DIAGNOSIS — IMO0001 Reserved for inherently not codable concepts without codable children: Secondary | ICD-10-CM

## 2016-01-07 MED ORDER — INSULIN ASPART 100 UNIT/ML FLEXPEN: PEN_INJECTOR | SUBCUTANEOUS | 0 refills | Status: DC

## 2016-01-07 MED ORDER — INSULIN GLARGINE 100 UNIT/ML SOLOSTAR PEN: 30.0000 [IU] | PEN_INJECTOR | Freq: Every morning | SUBCUTANEOUS | 0 refills | Status: DC

## 2016-01-07 NOTE — Patient Instructions (Signed)
Continue Lantus 30 units daily in the morning Will change Novolog to 4 units before breakfast and 4 units before supper. Only take novolog if you are eating.    Followup with Dr Raymondo BandKoval in 2-3 weeks (Thursday)

## 2016-01-07 NOTE — Progress Notes (Signed)
Patient ID: Jordan GoltzJoseph E Rodriguez, male   DOB: 04/29/1950, 65 y.o.   MRN: [id number]   Social work consult from Dr. Raymondo Bandkoval due to food insecurity impacting patient's diabetes.    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  Per patient his food insecurity is due paying rent, purchasing medication, paying bills, gas etc and not having enough money to go around.   LCSW shared the following with patient: list of local food pantries, local places where he can obtain hot meals, and resource for free produce.  LCSW also provided patient [facility] food [facility]. Patient was appreciative of resources provided.  Jordan Hineseborah Florine Sprenkle, LCSW Licensed Clinical Social Worker [facility]   [phone] 11:55 AM

## 2016-01-07 NOTE — Assessment & Plan Note (Signed)
Diabetes longstanding diagnosed currently uncontrolled and patient is at high risk for hypoglycemia given food insecurities. Patient reports hypoglycemic events and is able to verbalize appropriate hypoglycemia management plan. Patient reports adherence with medication. Continued basal insulin Lantus (insulin glargine) to 30 units QAM. Due to elevated lunchtime readings and consistent breakfast meals we have Adjusted dose of rapid insulin Novolog (insulin aspart) to 4 units before breakfast and 4 units before supper.  Instructed patient to only take Novolog if he is eating a meal. Next A1C anticipated 01/2016.  Clinic social worker was consulted about food insecurities and she was able to provide him with food from clinic pantry and resources of where to obtain free food in the [place] area. Microalbumin wnl at last visit and patient is not candidate for ACE inhibitor at this time for renal protection without HTN diagnosis.

## 2016-01-07 NOTE — Progress Notes (Signed)
    S:    Patient arrives in good spirits, ambulating without assistance.Jordan Rodriguez.  Presents for diabetes followup and was last seen in pharmacy clinic on 12/29/15 following low blood glucose with lab test result.  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  Current diabetes medications include: Lantus 30 units QAM, Humalog 8 units before supper, metformin 1000 mg BID  Patient reports hypoglycemic events. Reports treatment with peppermints or peanut butter and jelly sandwich. Does report symptoms of blurred vision when he has hypoglycemia.   Patient reported dietary habits: Tries to eat 3 meals/day.  Used the food from the food pantry which we provided to him last time. Does state that he is eating rice, tomatoes, pinto beans, biscuits which he knows are higher in carbohydrates but these are what he has available.Jordan Rodriguez.  He cannot afford low carbohydrate foods at this time.  Cereal for breakfast and banana for breakfast or fruit cup.   Lunch:sandwich, leftovers Dinner: Rice & tomatoes, pizza, tube steak with vegetables for dinner.    Patient reported exercise habits: reports he hasnt been walking as much recently. States he needs to restart walking at home.    Patient reports nocturia 1-2x/night Patient denies neuropathy. Patient denies visual changes unless his blood glucose is low. Patient reports self foot exams. Denies changes with his feet.   O:  Lab Results  Component Value Date   HGBA1C 8.7 10/13/2015   Vitals:   01/07/16 1110  BP: (!) 138/54  Pulse: 67    Home fasting CBG: 156, 132, 90, 93, 95, 144, 154, 80, 83 Before Lunch: 214, 186, 222, 224, 89, 229, 136, 287  Before Supper (takes insulin before meal): 74, 223, 99, 51, 126, 94, 90, 118 Before Bedtime: 57, 112, 92, 91, 74, 63   A/P: Diabetes longstanding diagnosed currently  uncontrolled and patient is at high risk for hypoglycemia given food insecurities. Patient reports hypoglycemic events and is able to verbalize appropriate hypoglycemia management plan. Patient reports adherence with medication. Continued basal insulin Lantus (insulin glargine) to 30 units QAM. Due to elevated lunchtime readings and consistent breakfast meals we have Adjusted dose of rapid insulin Novolog (insulin aspart) to 4 units before breakfast and 4 units before supper.  Instructed patient to only take Novolog if he is eating a meal. Next A1C anticipated 01/2016.  Clinic social worker was consulted about food insecurities and she was able to provide him with food from clinic pantry and resources of where to obtain free food in the [place] area. Microalbumin wnl at last visit and patient is not candidate for ACE inhibitor at this time for renal protection without HTN diagnosis.   ASCVD risk greater than 7.5%. Continued Aspirin 81 mg and Continued atorvastatin 40 mg.   Written patient instructions provided.  Total time in face to face counseling 40 minutes.   Follow up in Pharmacist Clinic Visit in 1-2 weeks.   Patient seen with Alphonzo Severanceyan Ragan, PharmD Candidate and Hazle NordmannKelsy Combs, PharmD PGY2 Resident

## 2016-01-07 NOTE — Progress Notes (Signed)
Patient ID: Jordan GoltzJoseph E Dunkerson, male   DOB: 07/09/1950, 65 y.o.   MRN: [id number] Reviewed: Agree with Dr. Macky LowerKoval's documentation and management.

## 2016-01-18 ENCOUNTER — Encounter: Payer: Self-pay | Admitting: Pharmacist

## 2016-01-18 ENCOUNTER — Ambulatory Visit (INDEPENDENT_AMBULATORY_CARE_PROVIDER_SITE_OTHER): Payer: BLUE CROSS/BLUE SHIELD | Admitting: Pharmacist

## 2016-01-18 DIAGNOSIS — IMO0001 Reserved for inherently not codable concepts without codable children: Secondary | ICD-10-CM

## 2016-01-18 DIAGNOSIS — E1165 Type 2 diabetes mellitus with hyperglycemia: Secondary | ICD-10-CM

## 2016-01-18 DIAGNOSIS — Z794 Long term (current) use of insulin: Secondary | ICD-10-CM

## 2016-01-18 MED ORDER — INSULIN ASPART 100 UNIT/ML FLEXPEN: PEN_INJECTOR | SUBCUTANEOUS | 0 refills | Status: DC

## 2016-01-18 MED ORDER — INSULIN GLARGINE 100 UNIT/ML SOLOSTAR PEN: 25.0000 [IU] | PEN_INJECTOR | Freq: Every morning | SUBCUTANEOUS | 0 refills | Status: DC

## 2016-01-18 NOTE — Progress Notes (Signed)
Patient ID: Jordan Rodriguez, male   DOB: 09/05/1950, 65 y.o.   MRN: [id number] Reviewed: Agree with Dr. Koval's documentation and management. 

## 2016-01-18 NOTE — Assessment & Plan Note (Signed)
Diabetes longstanding currently uncontrolled due to continued events of hypoglycemia. Hypoglycemia occurring less frequently than before. Patient reports hypoglycemic events and is able to verbalize appropriate hypoglycemia management plan. Patient reports adherence with medication. Control is suboptimal due to inconsistent access to food. Patient was not interested in food from [facility] pant[facility] at this visit.  Decreased dose of basal insulin Lantus (insulin glargine) from 30 to 25 units in the morning. Increased dose of rapid insulin Novolog (insulin aspart) from 4 units with breakfast and dinner to 6 units with breakfast and 8 units with dinner.

## 2016-01-18 NOTE — Patient Instructions (Addendum)
It was nice to see you today!  We made some adjustments to your insulin today. For your Lantus, use 25 units every morning. For your Novolog, use 6 units with breakfast and 8 units with dinner. Only take your Novolog if you are eating a meal.   Follow up with Dr. Raymondo BandKoval in December before December 18th.

## 2016-01-18 NOTE — Progress Notes (Signed)
    S:    Patient arrives alone, ambulating independently, and in good spirits.  Presents for diabetes evaluation, education, and management. Patient was referred on 12/16/2015.  Patient was last seen by Primary Care Provider on 12/16/2015.   Patient reports Diabetes was diagnosed in May 2015.   Patient reports adherence with medications. No remaining doses of Lantus, last dose this AM. Reports difficulty affording medications at the moment due to bills that have piled up.  Current diabetes medications include: Lantus 30 units every AM. Novolog 4 units with breakfast and 4 units with dinner. Metformin 1000mg  PO two times a day.  Patient reports hypoglycemic events according to blood sugar logbook.  Patient reported dietary habits: Eats 3 meals/day Breakfast: Cereal Lunch: Sandwich with fruit cup/fresh fruit Dinner: Two servings of rice with stewed tomatoes and a slice of bread.  O:  Lab Results  Component Value Date   HGBA1C 8.7 10/13/2015   Vitals:   01/18/16 1107 01/18/16 1109  BP: (!) 151/66 139/60  Pulse: 79 75    Home fasting CBG: 90-130  Tests BG at home 3-4 times/day Logbook shows 4 low blood sugar readings since last visit on 01/07/2016. Range 50-70. All occur before lunch or before dinner.  A/P: Diabetes longstanding currently uncontrolled due to continued events of hypoglycemia. Hypoglycemia occurring less frequently than before. Patient reports hypoglycemic events and is able to verbalize appropriate hypoglycemia management plan. Patient reports adherence with medication. Control is suboptimal due to inconsistent access to food. Patient was not interested in food from [facility] pant[facility] at this visit.  Decreased dose of basal insulin Lantus (insulin glargine) from 30 to 25 units in the morning. Increased dose of rapid insulin Novolog (insulin aspart) from 4 units with breakfast and dinner to 6 units with breakfast and 8 units with dinner.   Next A1C anticipated December 2017.      ASCVD risk greater than 7.5%. Continued Aspirin 81 mg and Continued atorvastatin 40 mg.   Written patient instructions provided.  Total time in face to face counseling 30 minutes.   Follow up in Pharmacist Clinic Visit 02/11/2016.   Patient seen with Alphonzo Severanceyan Ragan, PharmD Candidate and Rosalita ChessmanKenny Kang, PharmD PGY1 Resident.

## 2016-01-28 ENCOUNTER — Ambulatory Visit: Payer: BLUE CROSS/BLUE SHIELD | Admitting: Pharmacist

## 2016-02-11 ENCOUNTER — Ambulatory Visit (INDEPENDENT_AMBULATORY_CARE_PROVIDER_SITE_OTHER): Payer: BLUE CROSS/BLUE SHIELD | Admitting: Pharmacist

## 2016-02-11 ENCOUNTER — Encounter: Payer: Self-pay | Admitting: Pharmacist

## 2016-02-11 DIAGNOSIS — Z794 Long term (current) use of insulin: Secondary | ICD-10-CM | POA: Diagnosis not present

## 2016-02-11 DIAGNOSIS — IMO0001 Reserved for inherently not codable concepts without codable children: Secondary | ICD-10-CM

## 2016-02-11 DIAGNOSIS — E1165 Type 2 diabetes mellitus with hyperglycemia: Secondary | ICD-10-CM | POA: Diagnosis not present

## 2016-02-11 MED ORDER — INSULIN ASPART 100 UNIT/ML FLEXPEN: 6.0000 [IU] | PEN_INJECTOR | Freq: Two times a day (BID) | SUBCUTANEOUS | 0 refills | Status: DC

## 2016-02-11 MED ORDER — INSULIN GLARGINE 100 UNIT/ML SOLOSTAR PEN: 20.0000 [IU] | PEN_INJECTOR | Freq: Every morning | SUBCUTANEOUS | 2 refills | Status: DC

## 2016-02-11 MED ORDER — INSULIN GLARGINE 100 UNIT/ML SOLOSTAR PEN: 20.0000 [IU] | PEN_INJECTOR | Freq: Every day | SUBCUTANEOUS | 0 refills | Status: DC

## 2016-02-11 MED ORDER — EMPAGLIFLOZIN 10 MG PO TABS: 10.0000 mg | ORAL_TABLET | Freq: Every day | ORAL | 1 refills | Status: DC

## 2016-02-11 NOTE — Patient Instructions (Addendum)
Decrease Lantus to 20 units daily Continue Novolog 6 units before breakfast and 8 units before dinner (only take if you are eating a meal)  Start Jardiance 10 mg daily if it is covered by the copay card.  Call clinic if you start seeing more low blood sugars.  Continue checking your blood sugar 3 times per day.   Followup with Dr Raymondo BandKoval in 3 weeks.

## 2016-02-11 NOTE — Progress Notes (Signed)
   S:    Patient arrives in fair spirits.  Presents for diabetes evaluation, education, and management at the request of Dr. Kennon RoundsHaney.   Patient reports adherence with medications.  Current diabetes medications include: Lantus 25 units daily and Novolog prior to two meals daily.   Patient reports hypoglycemic events.  Per his meter has had 14 readings <70 mg/dL since 16/11/9609/20/17  Patient reported dietary habits: Eats 2 meals/day Breakfast: Eggs, liver pudding, and toast or milk cereal and milk Lunch:Often skips Dinner:vegetables or leftovers Drinks:water or water with sugar free flavor packs.     Patient reports nocturia 1-2x/night  Patient denies neuropathy. Patient reports visual changes and states he has had some blurred vision with hypoglycemia episodes. Patient reports self foot exams.   O:  Lab Results  Component Value Date   HGBA1C 8.7 10/13/2015   Home fasting CBG: ~100 however multiple readings remain <80  2 hour post-prandial/random CBG: improved and continued readings < 80 at times.    A/P: Diabetes remains with poor control despite lowering basal insulin and increasing prandial dosing.   Patient reports hypoglycemic events and is able to verbalize appropriate hypoglycemia management plan. Patient reports adherence with medication. Control is suboptimal due to erratic meal schedule and meal availability. Decreased dose of basal insulin Lantus (insulin glargine) from 25 to 20 units once daily Enrolled patient in zero-dollar copay card for Lantus and provided single pen sample. Continued rapid insulin Novolog (insulin aspart) at 6-8 units prior to meals twice daily and provided single pen sample.  Initiated trial of Jardiance (empagliflozin) 10mg  through use of a zero-co-pay card.     Written patient instructions provided.  Total time in face to face counseling 25 minutes.   Follow up in Pharmacist Clinic Visit 1 month.   Patient seen with Hazle NordmannKelsy Combs, PharmD, BCPS PGY2  Resident.  .Marland Kitchen

## 2016-02-11 NOTE — Assessment & Plan Note (Signed)
Diabetes remains with poor control despite lowering basal insulin and increasing prandial dosing.   Patient reports hypoglycemic events and is able to verbalize appropriate hypoglycemia management plan. Patient reports adherence with medication. Control is suboptimal due to erratic meal schedule and meal availability. Decreased dose of basal insulin Lantus (insulin glargine) from 25 to 20 units once daily Enrolled patient in zero-dollar copay card for Lantus and provided single pen sample. Continued rapid insulin Novolog (insulin aspart) at 6-8 units prior to meals twice daily and provided single pen sample.  Initiated trial of Jardiance (empagliflozin) 10mg  through use of a zero-co-pay card.

## 2016-02-11 NOTE — Progress Notes (Signed)
Patient ID: Jordan Rodriguez, male   DOB: 05/24/1950, 65 y.o.   MRN: [id number] Reviewed: Agree with Dr. Koval's documentation and management. 

## 2016-03-03 ENCOUNTER — Ambulatory Visit: Payer: BLUE CROSS/BLUE SHIELD | Admitting: Pharmacist

## 2016-03-10 ENCOUNTER — Ambulatory Visit: Payer: BLUE CROSS/BLUE SHIELD | Admitting: Pharmacist

## 2016-03-31 ENCOUNTER — Ambulatory Visit (INDEPENDENT_AMBULATORY_CARE_PROVIDER_SITE_OTHER): Payer: BLUE CROSS/BLUE SHIELD | Admitting: Pharmacist

## 2016-03-31 ENCOUNTER — Encounter: Payer: Self-pay | Admitting: Pharmacist

## 2016-03-31 DIAGNOSIS — E7800 Pure hypercholesterolemia, unspecified: Secondary | ICD-10-CM

## 2016-03-31 DIAGNOSIS — IMO0001 Reserved for inherently not codable concepts without codable children: Secondary | ICD-10-CM

## 2016-03-31 DIAGNOSIS — E1165 Type 2 diabetes mellitus with hyperglycemia: Secondary | ICD-10-CM | POA: Diagnosis not present

## 2016-03-31 DIAGNOSIS — Z794 Long term (current) use of insulin: Secondary | ICD-10-CM | POA: Diagnosis not present

## 2016-03-31 MED ORDER — EMPAGLIFLOZIN 25 MG PO TABS: 25.0000 mg | ORAL_TABLET | Freq: Every day | ORAL | 3 refills | Status: DC

## 2016-03-31 MED ORDER — INSULIN GLARGINE 100 UNIT/ML SOLOSTAR PEN: 16.0000 [IU] | PEN_INJECTOR | Freq: Every day | SUBCUTANEOUS | 3 refills | Status: DC

## 2016-03-31 MED ORDER — INSULIN ASPART 100 UNIT/ML FLEXPEN: 6.0000 [IU] | PEN_INJECTOR | Freq: Two times a day (BID) | SUBCUTANEOUS | 0 refills | Status: DC

## 2016-03-31 NOTE — Assessment & Plan Note (Signed)
ASCVD risk greater than 7.5%. Continued Aspirin 81 mg and Continued atorvastatin 40 mg daily.  

## 2016-03-31 NOTE — Patient Instructions (Signed)
Decrease Lantus to 16 units once daily Restart Jardiance.  Will increase to 25 mg daily Continue Novolog 6 units before breakfast and 8 units before dinner.   Call clinic if you have more than 2 episodes of low blood sugars (<70 mg/dL) in 1 week  Followup in 3-4 weeks with Dr Raymondo BandKoval

## 2016-03-31 NOTE — Progress Notes (Signed)
    S:    Patient arrives in good spirits.  Presents for diabetes evaluation, education, and management.  He was last seen in pharmacy clinic on 02/11/16.   Patient reports adherence with medications.  Current diabetes medications include: Increased his lantus to 30 units daily after he ran out of Novolog a few days ago.  Novolog 6 units before breakfast 8 before dinner (ran out), Jardiance 10 mg daily (ran out 5 days ago) Current hypertension medications include:   Patient reports hypoglycemic events a couple of times per week.  Has 9 CBG values </= 70 mg/dL over the past month.   Patient reported dietary habits: Eats 2-3 meals/day.  Is trying to eat 3 meals.  Reports he sometimes runs out of meat as a main dish.   Patient reported exercise habits: limited but is walking a lot when working 2 days per week.    Patient reports nocturia 2x/night.  Patient denies pain/burning when urinating.   Patient denies neuropathy. Patient denies visual changes. Patient reports self foot exams. Denies changes.    O:  Lab Results  Component Value Date   HGBA1C 8.7 10/13/2015   Vitals:   03/31/16 1108  BP: 138/74  Pulse: 84    CBG over past 9 days Home fasting CBG: 98, 98, 138, 107, 124, 120, 85, 68, 76, 121  Before Lunch CBG: 106, 206, 211, 196, 152, 129, 130, 182, 208 Before supper CBG: 117, 97, 117, 104, 162, 105, 80, 121, 56.  A/P: Diabetes longstanding diagnosed currently uncontrolled but with decreased hypoglycemic episodes. Patient reports hypoglycemic events and is able to verbalize appropriate hypoglycemia management plan. Patient reports adherence with medication. Control is suboptimal due to risk of hypoglycemia and food insecurities.  Decreased dose of basal insulin Lantus (insulin glargine) to 16 units daily. Continued rapid insulin Novolog (insulin aspart) 6 units QAM and 8 units QPM.  Increased dose of Jardiance (empagliflozin) to 25 mg daily.  Patient may continue to need further  insulin dose reduction due to Jardiance increase.  Instructed patient to call clinic if he has trouble affording medications or if he has >2 hypoglycemic episodes in 1 week.  ASCVD risk greater than 7.5%. Continued Aspirin 81 mg and Continued atorvastatin 40 mg daily.   Written patient instructions provided.  Total time in face to face counseling 30 minutes.   Follow up in Pharmacist Clinic Visit in 4 weeks.   Patient seen with Hazle NordmannKelsy Combs, PharmD, BCPS

## 2016-03-31 NOTE — Progress Notes (Signed)
Patient ID: Jordan Rodriguez, male   DOB: 12/02/1950, 65 y.o.   MRN: [id number] Reviewed: Agree with Dr. Koval's documentation and management. 

## 2016-03-31 NOTE — Assessment & Plan Note (Signed)
Diabetes longstanding diagnosed currently uncontrolled but with decreased hypoglycemic episodes. Patient reports hypoglycemic events and is able to verbalize appropriate hypoglycemia management plan. Patient reports adherence with medication. Control is suboptimal due to risk of hypoglycemia and food insecurities.  Decreased dose of basal insulin Lantus (insulin glargine) to 16 units daily. Continued rapid insulin Novolog (insulin aspart) 6 units QAM and 8 units QPM.  Increased dose of Jardiance (empagliflozin) to 25 mg daily.  Patient may continue to need further insulin dose reduction due to Jardiance increase.  Instructed patient to call clinic if he has trouble affording medications or if he has >2 hypoglycemic episodes in 1 week.

## 2016-04-21 ENCOUNTER — Encounter: Payer: Self-pay | Admitting: Pharmacist

## 2016-04-21 ENCOUNTER — Encounter: Payer: Self-pay | Admitting: Licensed Clinical Social Worker

## 2016-04-21 ENCOUNTER — Ambulatory Visit (INDEPENDENT_AMBULATORY_CARE_PROVIDER_SITE_OTHER): Payer: BLUE CROSS/BLUE SHIELD | Admitting: Pharmacist

## 2016-04-21 DIAGNOSIS — IMO0001 Reserved for inherently not codable concepts without codable children: Secondary | ICD-10-CM

## 2016-04-21 DIAGNOSIS — E1165 Type 2 diabetes mellitus with hyperglycemia: Secondary | ICD-10-CM | POA: Diagnosis not present

## 2016-04-21 DIAGNOSIS — Z794 Long term (current) use of insulin: Secondary | ICD-10-CM | POA: Diagnosis not present

## 2016-04-21 LAB — POCT GLYCOSYLATED HEMOGLOBIN (HGB A1C): HEMOGLOBIN A1C: 7.1

## 2016-04-21 MED ORDER — INSULIN ASPART 100 UNIT/ML FLEXPEN: 6.0000 [IU] | PEN_INJECTOR | Freq: Two times a day (BID) | SUBCUTANEOUS | 0 refills | Status: DC

## 2016-04-21 MED ORDER — INSULIN GLARGINE 100 UNIT/ML SOLOSTAR PEN: 14.0000 [IU] | PEN_INJECTOR | Freq: Every day | SUBCUTANEOUS | 0 refills | Status: DC

## 2016-04-21 NOTE — Patient Instructions (Addendum)
Decrease Lantus to 14 units daily Decrease Novolog to 6 units before breakfast and 6 units before dinner  Please call clinic if you have frequent low blood sugars < 70  Followup with Dr Raymondo BandKoval in 1 month

## 2016-04-21 NOTE — Progress Notes (Addendum)
  Reason for follow-up: Continue brief intervention to address symptoms associated with uncontrolled type 2 diabetes and food insecurities .   Identified goals,  1. A1C numbers to look better  2. Eat healthier 3. Start exercising   Issues discussed:  1. Reviewed access to previous food resources provided 2. Daily activities 3. What does exercising and healthy eating look like?  4. What are barriers to the above goals?   Behavioral Health Intervention: Motivational Interviewing and Solution Focused [employer] and nutrition classes  Provided patient with food from the Food Box program  PLAN: 1. Patient will start walking 3 days a week for about 5 to 10 min (within his limits) 2. Patient will go to [employer] for Nutrition classes and will receive healthy food boxes monthly  3. Patient will obtain fresh fruit and veg. monthly from the free mobile food market    Sammuel Hineseborah Skye Plamondon, LCSW Licensed Clinical Social Worker [facility]   [phone] 2:04 PM

## 2016-04-21 NOTE — Assessment & Plan Note (Addendum)
Diabetes longstanding diagnosed currently controlled but at risk for hypoglycemia given food insecurities. Patient reports hypoglycemic events and is able to verbalize appropriate hypoglycemia management plan. Patient reports adherence with medication. Control is suboptimal due to food insecurities.  Decreased dose of basal insulin Lantus (insulin glargine) to 14 units daily.  Decreased dose of rapid insulin Novolog (insulin aspart) to 6 units before breakfast and dinner. Have decreased dose due to risk of hypoglycemia as patient has difficulty affording food.  Samples were provided. A1C completed today.  Patient was seen by clinic social worke, Sammuel HinesDeborah Moore,  today and provided with food box and community resources to obtain food.

## 2016-04-21 NOTE — Progress Notes (Signed)
    S:    Patient arrives in good spirits ambulating without assistance.  Presents for diabetes evaluation, education, and management at the request of Dr Natale MilchLancaster. Patient was last seen by Primary Care Provider on 10/13/15 and last seen in pharmacy clinic on 03/31/2016. Reports having cold/flu like symptoms 2/16-2/19 which have now resolved mostly but he still does have some weakness.  He denies fever,chills, cough, congestion or runny nose.  He does report that he is maintaining hydration.   Patient reports adherence with medications.  Current diabetes medications include: Lantus 16 units daily, Novolog 6 units QAM and 8 units QPM.   Patient reports hypoglycemic events with most hypoglycemic events occurring either when he was sick or at bedtime. States he usually checks his CBGs when he feels low and doesn't believe he missed any hypoglycemic events.   Patient reported dietary habits: Eats 2-3 meals/day.  States he may not be getting enough carbohydrates due to food insecurities. Mostly eats breakfast and dinner.    Patient reports nocturia 2x/night. Denies pain/burning upon urination.  Does report one occasion a few weeks ago with some burning when urinating.  Patient reports occasional neuropathy. Patient reports visual changes with occasional blurred vision usually with reading.  Patient reports self foot exams. Denies changes  He states he is getting low on CBG strips but has enough money to afford them.  O:  Lab Results  Component Value Date   HGBA1C 7.1 04/21/2016   Home fasting CBG: 83/ 111/ 106/ 84/ 100/ 78/ 104/ 80/ 119/ 88 / 116  Before lunch: 133 / 187 / 91 / 86 / 76 / 61 / 176 / 112 / 133 / 126 / 100 / 115 / 63 / 116 / 89  Before Dinner: 79 / 114 / 87 / 101 / 110 / 102 / 100 / 113 / 98 / 150 / 101 / 113 / 147 / 110 At Bedtime CBG: 135 / 73 / 114 / 107 / 96 / 54 / 48 / 66    A/P: Diabetes longstanding diagnosed currently controlled but at risk for hypoglycemia given food  insecurities. Patient reports hypoglycemic events and is able to verbalize appropriate hypoglycemia management plan. Patient reports adherence with medication. Control is suboptimal due to food insecurities.  Decreased dose of basal insulin Lantus (insulin glargine) to 14 units daily.  Decreased dose of rapid insulin Novolog (insulin aspart) to 6 units before breakfast and dinner. Have decreased dose due to risk of hypoglycemia as patient has difficulty affording food.  Samples were provided. A1C completed today.  Patient was seen by clinic social worke, Sammuel HinesDeborah Moore,  today and provided with food box and community resources to obtain food  ASCVD risk greater than 7.5%. Continued Aspirin 81 mg and Continued atorvastatin 40 mg daily.   Written patient instructions provided.  Total time in face to face counseling 40 minutes.   Follow up in Pharmacist Clinic Visit in 1 month.   Patient seen with Dietrich PatesKatie Cook, PharmD PGY1 Resident. and Hazle NordmannKelsy Combs, PharmD PGY2 Resident.

## 2016-04-25 NOTE — Progress Notes (Signed)
Patient ID: Jordan Rodriguez, male   DOB: 06/23/1950, 65 y.o.   MRN: [id number] Reviewed: Agree with Dr. Koval's documentation and management. 

## 2016-05-19 ENCOUNTER — Encounter (INDEPENDENT_AMBULATORY_CARE_PROVIDER_SITE_OTHER): Payer: BLUE CROSS/BLUE SHIELD | Admitting: Licensed Clinical Social Worker

## 2016-05-19 ENCOUNTER — Ambulatory Visit (INDEPENDENT_AMBULATORY_CARE_PROVIDER_SITE_OTHER): Payer: BLUE CROSS/BLUE SHIELD | Admitting: Pharmacist

## 2016-05-19 ENCOUNTER — Encounter: Payer: Self-pay | Admitting: Pharmacist

## 2016-05-19 DIAGNOSIS — Z794 Long term (current) use of insulin: Secondary | ICD-10-CM

## 2016-05-19 DIAGNOSIS — E1165 Type 2 diabetes mellitus with hyperglycemia: Secondary | ICD-10-CM

## 2016-05-19 DIAGNOSIS — IMO0001 Reserved for inherently not codable concepts without codable children: Secondary | ICD-10-CM

## 2016-05-19 MED ORDER — INSULIN GLARGINE 100 UNIT/ML SOLOSTAR PEN: 12.0000 [IU] | PEN_INJECTOR | Freq: Every day | SUBCUTANEOUS | 0 refills | Status: DC

## 2016-05-19 NOTE — Patient Instructions (Addendum)
Thank you for coming to see us today! Decrease your daily Lantus dose to 12 units daily. Continue taking Novolog 6 units twice a day before breakfast and dinner, but you should take less than 6 units if you have a small meal. If you could do your best to write down what you eat for each meal that will help us adjust your insulin dosing in the future. Continue to do a great job keeping track of your sugars every day!  Plan on coming back to see us in about a month.

## 2016-05-19 NOTE — Progress Notes (Signed)
Patient ID: Constance GoltzJoseph E Wisenbaker, male   DOB: 05/19/1950, 66 y.o.   MRN: [id number]  Reason for follow-up: Continue brief intervention to address symptoms associated with uncontrolled type 2 diabetes and food insecurities .   Identified goals,  1. A1C numbers to look better  2. Eat healthier 3. Start exercising   Issues discussed:  1.Patient attending food nutrition classes 2. Getting food boxes monthly from food program 2. Not doing well on exercising (Walking)  3. Overall is eating better   Behavioral Health Intervention: Motivational Interviewing and Solution Focused   PLAN: 1. Patient will get up from his work area every 30 min. and walk around the office   2. Patient will continue with [employer] Nutrition classes 3. Patient will continue to get fresh fruit and veg. from the free mobile food market   Sammuel Hineseborah Moore, LCSW Licensed Clinical Social Worker [facility]   [phone] 12:07 PM

## 2016-05-19 NOTE — Progress Notes (Signed)
    S:     Chief Complaint  Patient presents with  . Medication Management    Diabetes    Patient arrives in good spirits.  Presents for diabetes evaluation, education, and management. Patient was last seen by his Primary Care Provider Dr. Lancaster on 12/16/2015. Patient told us he cannot afford his empagliflozin for another week or so, last dose was 3 days ago. Patient reports he sometimes cannot check CBGs as often as he likes since he runs out of strips. Patient sometime has trouble securing food and social work is working with him to help with this.   Family/Social History: non-contributory  Patient reports adherence with medications.  Current diabetes medications include: Metformin 1000 mg BID, Lantus 14 units daily, Novolog 6 units BIC, Empagliflozin 25 mg daily (Last dose 3 days ago, says he can't pick it up again for around a week) Current hypertension medications include: N/A  Patient reports occasional hypoglycemic events.  Patient reported dietary habits: Eats 2-3 meals/day Breakfast: Cereal and banana 5 days a week; bacon and eggs for a bigger breakfast  Lunch: It varies. Doesn't take insulin with this meal.  Dinner: Fish sandwich, fries Snacks: N/A  Patient reported exercise habits:    Patient reports nocturia one to two times a night Patient reports neuropathy. May be due to coffee Patient denies visual changes. Patient reports self foot exams.    O:  Physical Exam   ROS   Lab Results  Component Value Date   HGBA1C 7.1 04/21/2016   Vitals:   05/19/16 1208  BP: 138/78    Home fasting CBG: 80-100 2 hour post-prandial/random CBG: mid 100s  A/P: Diabetes longstanding currently well controlled with A1c of 7.1 one month ago. Patient reports occasional hypoglycemic events and is able to verbalize appropriate hypoglycemia management plan. Patient reports adherence with medication with the exception of empagliflozin which he stopped taking three days ago  because he could not afford it. Plans to restart when he has money in the next week.  Decreased dose of basal insulin Lantus (insulin glargine) from 14 to 12 units daily. Continued rapid insulin Novolog (insulin aspart) 6 units BID. Advised patient to take less than 6 units if he has a small breakfast or to skip taking it until he eats. Encouraged patient to continue to take regular CBGs and asked if he could write more detail about what he eats to help us understand how his meals and insulin dosing affects his CBGs.   Briefly met with Deborah Moore, CSW RE food security.   Next A1C anticipated May 2018.    ASCVD risk greater than 7.5%. Continued Aspirin 81 mg and Continued atorvastatin 40 mg.    Written patient instructions provided. Total time in face to face counseling 30 minutes.   Follow up in Pharmacist Clinic Visit in one month.  Patient seen with Justin Crowder, PharmD Candidate, Sarah Mislan, PharmD Candidate, and Kelsy Combs, PharmD, BCPS, PGY2 Resident.    

## 2016-05-19 NOTE — Assessment & Plan Note (Signed)
Diabetes longstanding currently well controlled with A1c of 7.1 one month ago. Patient reports occasional hypoglycemic events and is able to verbalize appropriate hypoglycemia management plan. Patient reports adherence with medication with the exception of empagliflozin which he stopped taking three days ago because he could not afford it. Plans to restart when he has money in the next week.  Decreased dose of basal insulin Lantus (insulin glargine) from 14 to 12 units daily. Continued rapid insulin Novolog (insulin aspart) 6 units BID. Advised patient to take less than 6 units if he has a small breakfast or to skip taking it until he eats. Encouraged patient to continue to take regular CBGs and asked if he could write more detail about what he eats to help us understand how his meals and insulin dosing affects his CBGs.

## 2016-05-19 NOTE — Progress Notes (Signed)
Patient ID: Jordan Rodriguez, male   DOB: 02/19/1951, 65 y.o.   MRN: [id number] Reviewed: Agree with Dr. Koval's documentation and management. 

## 2016-05-20 MED ORDER — INSULIN ASPART 100 UNIT/ML FLEXPEN: 6.0000 [IU] | PEN_INJECTOR | Freq: Two times a day (BID) | SUBCUTANEOUS | 0 refills | Status: DC

## 2016-05-20 NOTE — Addendum Note (Signed)
Addended by: Kathrin RuddyKOVAL, PETER G on: 05/20/2016 10:42 AM   Modules accepted: Orders

## 2016-06-23 ENCOUNTER — Encounter: Payer: Self-pay | Admitting: Pharmacist

## 2016-06-23 ENCOUNTER — Ambulatory Visit (INDEPENDENT_AMBULATORY_CARE_PROVIDER_SITE_OTHER): Payer: BLUE CROSS/BLUE SHIELD | Admitting: Pharmacist

## 2016-06-23 DIAGNOSIS — E1165 Type 2 diabetes mellitus with hyperglycemia: Secondary | ICD-10-CM | POA: Diagnosis not present

## 2016-06-23 DIAGNOSIS — Z794 Long term (current) use of insulin: Secondary | ICD-10-CM

## 2016-06-23 DIAGNOSIS — IMO0001 Reserved for inherently not codable concepts without codable children: Secondary | ICD-10-CM

## 2016-06-23 MED ORDER — INSULIN ASPART 100 UNIT/ML FLEXPEN: 4.0000 [IU] | PEN_INJECTOR | Freq: Two times a day (BID) | SUBCUTANEOUS | Status: DC

## 2016-06-23 MED ORDER — METFORMIN HCL 1000 MG PO TABS: ORAL_TABLET | ORAL | 5 refills | Status: DC

## 2016-06-23 NOTE — Progress Notes (Signed)
    S:     Chief Complaint  Patient presents with  . Medication Management    Diabetes    Patient arrives in good spirits ambulating without assistance.  Presents for diabetes evaluation, education, and management.  Patient was last seen by Primary Care Provider on 12/16/15 and last seen in pharmacy clinic on 05/19/16.    Patient reports adherence with medications but states he ran out of Jardiance and atorvastatin this past Sunday. He states he can afford to pick these up next week.  Current diabetes medications include: Novolog 6 units with breakfast and dinner, Lantus 12 units daily, Jardiance 25 mg daily   Patient reports hypoglycemic events (6  CBG <70 over past month - all occurring prior to lunch)  Patient reported dietary habits: Reports breakfast is his biggest meal.  He is still getting boxes of food and attending nutrition classes.  He did miss one class but has food currently.    O:  Physical Exam  Vitals reviewed.  Review of Systems  Unable to perform ROS: Other   Lab Results  Component Value Date   HGBA1C 7.1 04/21/2016   Lipid Panel     Component Value Date/Time   CHOL 141 07/19/2013 0913   TRIG 62 07/19/2013 0913   HDL 39 (L) 07/19/2013 0913   CHOLHDL 3.6 07/19/2013 0913   VLDL 12 07/19/2013 0913   LDLCALC 90 07/19/2013 0913   LDLDIRECT 49 12/24/2015 1154   Vitals:   06/23/16 1524  BP: (!) 120/58  Pulse: 76    Home fasting CBG: Range 84-122 Pre Lunch CBG: Range 51-198 (does have 6 values <70 mg/dL over past month)  Pre Dinner CBG: Range 95-294  (most in low 100s).  A/P: Diabetes longstanding diagnosed currently controlled but wit increased hypoglycemic episodes before lunch. Patient reports hypoglycemic events and is able to verbalize appropriate hypoglycemia management plan. Patient reports adherence with medication. Control is suboptimal due to food insecurities.  Continue same dose of Lantus 12 units each morning.  Decrease Novolog to 4 units  prior to Breakfast AND continue 6 units prior to evening meal. Patient will restart Jardiance 25 mg in 1 week once he gets paid. (copay $20 per [facility] pharmacy). Discussed food insecurities and treatment of hypoglycemia.   Next A1C anticipated 07/19/16.    ASCVD risk greater than 7.5%. Continued Aspirin 81 mg and Continued atorvastatin 40 mg daily.     Written patient instructions provided.  Total time in face to face counseling 40 minutes.   Follow up with Dr Natale Milch in 4 weeks then followup in pharmacy clinic in June.   Patient seen with Coolidge Breeze, PharmD Candidate and Hazle Nordmann, PharmD, BCPS, PGY2 Resident.

## 2016-06-23 NOTE — Patient Instructions (Signed)
Restart Jardiance as soon as you can pick it up.   Continue same dose of Lantus 12 units each morning.   Change Novolog to 4 units prior to Breakfast AND continue 6 units prior to evening meal.   Next visit with Dr. Natale Milch in 4 weeks.  Please bring your blood sugar readings.   Then plan to follow up with pharmacist visit (Dr. Raymondo Band) in June.

## 2016-06-23 NOTE — Assessment & Plan Note (Signed)
Diabetes longstanding diagnosed currently controlled but wit increased hypoglycemic episodes before lunch. Patient reports hypoglycemic events and is able to verbalize appropriate hypoglycemia management plan. Patient reports adherence with medication. Control is suboptimal due to food insecurities.  Continue same dose of Lantus 12 units each morning.  Decrease Novolog to 4 units prior to Breakfast AND continue 6 units prior to evening meal. Patient will restart Jardiance 25 mg in 1 week once he gets paid. (copay $20 per [facility] pharmacy). Discussed food insecurities and treatment of hypoglycemia.

## 2016-06-27 NOTE — Progress Notes (Signed)
Patient ID: Jordan Rodriguez, male   DOB: 02/06/1951, 65 y.o.   MRN: [id number] Reviewed: Agree with Dr. Koval's documentation and management. 

## 2016-07-18 ENCOUNTER — Ambulatory Visit: Payer: BLUE CROSS/BLUE SHIELD | Admitting: Internal Medicine

## 2016-08-08 ENCOUNTER — Ambulatory Visit: Payer: BLUE CROSS/BLUE SHIELD | Admitting: Internal Medicine

## 2016-08-08 ENCOUNTER — Telehealth: Payer: Self-pay | Admitting: Internal Medicine

## 2016-08-08 NOTE — Telephone Encounter (Signed)
Pt has run out of his novolog and test strips.  He wanted to see if dr Raymondo Bandkoval has any samples. Please advise

## 2016-08-09 ENCOUNTER — Ambulatory Visit: Payer: BLUE CROSS/BLUE SHIELD | Admitting: Pharmacist

## 2016-08-12 NOTE — Telephone Encounter (Signed)
Attempted to call RE insulin.  Appears he received Novolog yesterday - unsure if he needs lantus.  We have NONE at this time but should get some soon.  let me know if he returnes.

## 2016-08-23 ENCOUNTER — Ambulatory Visit: Payer: BLUE CROSS/BLUE SHIELD | Admitting: Pharmacist

## 2016-09-12 ENCOUNTER — Ambulatory Visit: Payer: BLUE CROSS/BLUE SHIELD | Admitting: Internal Medicine

## 2016-11-02 ENCOUNTER — Telehealth: Payer: Self-pay | Admitting: Pharmacist

## 2016-11-02 NOTE — Telephone Encounter (Signed)
Patient has been gambling per his report and lost his money.  He requests insulin samples.  Home CBGs reported high 200s -300s.   He states he last took insulin 3 days ago.   Asked if he coudl come in today - he did not have a ride.    Patient willing to come to our office in the AM for help with insulin.   An appointment in my clinic was provided at 10:30 AM - he was asked to drink water (avoiding juice and regular soda) tonight to stay hydrated.   Patient verbalized appreciation for support/help.

## 2016-11-03 ENCOUNTER — Ambulatory Visit (INDEPENDENT_AMBULATORY_CARE_PROVIDER_SITE_OTHER): Payer: BLUE CROSS/BLUE SHIELD | Admitting: Pharmacist

## 2016-11-03 ENCOUNTER — Encounter: Payer: Self-pay | Admitting: Licensed Clinical Social Worker

## 2016-11-03 ENCOUNTER — Encounter: Payer: Self-pay | Admitting: Pharmacist

## 2016-11-03 DIAGNOSIS — E1165 Type 2 diabetes mellitus with hyperglycemia: Secondary | ICD-10-CM

## 2016-11-03 DIAGNOSIS — Z794 Long term (current) use of insulin: Secondary | ICD-10-CM | POA: Diagnosis not present

## 2016-11-03 DIAGNOSIS — IMO0001 Reserved for inherently not codable concepts without codable children: Secondary | ICD-10-CM

## 2016-11-03 MED ORDER — INSULIN ASPART 100 UNIT/ML FLEXPEN: 4.0000 [IU] | PEN_INJECTOR | Freq: Two times a day (BID) | SUBCUTANEOUS | 0 refills | Status: DC

## 2016-11-03 MED ORDER — INSULIN DEGLUDEC 200 UNIT/ML ~~LOC~~ SOPN: 12.0000 [IU] | PEN_INJECTOR | Freq: Every day | SUBCUTANEOUS | 0 refills | Status: DC

## 2016-11-03 NOTE — Progress Notes (Signed)
Total time:15 minutes Type of Service: Integrated Behavioral Health warm handoff  Interpreter:No.     SUBJECTIVE: Jordan Rodriguez is a 66 y.o. male referred by Dr. Raymondo BandKoval for: psychosocial concern.  Patient reports he has been gambling and making poor decisions.  Duration of problem:  Gambling on and off since 2007.  Has been to Gamblers Anonymous and therapy for his behavior several years ago.   Impact on function: does not have money to pay bills, purchase medication or food.  LIFE CONTEXT:  Family & Social: lives alone, denies drinking alcohol or using drugs.  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   GOALS:  Patient will reduce symptoms of :, gambling obsessions and stress, increase :coping skills and self-management skills, Increase healthy adjustment to current life circumstances and Increase adequate support systems for patient/family.  Intervention:  Motivational Interviewing, Reflective listening, discussed [employer], Referral to Counselor/Psychotherapist and Patient declines   Provided patient with food from [facility] food panty.   Issues discussed: Current support system, what worked in the past for gambling behavior, why he wants to stop, community resources available,       ASSESSMENT:Patient currently experiencing financial stressors, started gambling in an attempt to bring in more money. However this has created more stress and hardship.  Patient may benefit from further assessment and ongoing therapeutic interventions to assist with managing his addictive behavior, however patient declined assistance.  States he will utilize his pastor and faith as a support system.  PLAN:  1. Patient will meet with his pastor   2. Patient will go to food program Monday to pick up a food box   3. Patient will contact his gambling therapist if needed after he meets with his pastor  Warm Hand Off Completed.     Sammuel Hineseborah  Genova Kiner, LCSW Licensed Clinical Social Worker [facility]   [phone] 12:09 PM

## 2016-11-03 NOTE — Progress Notes (Signed)
Patient ID: Jordan GoltzJoseph E Rodriguez, male   DOB: 04/06/1950, 66 y.o.   MRN: [id number] Reviewed: Agree with Dr. Macky LowerKoval's documentation and management.

## 2016-11-03 NOTE — Progress Notes (Signed)
    S:     Chief Complaint  Patient presents with  . Medication Management    Diabetes    Patient arrives in fair spirits, ambulating without assistance.  Presents for diabetes evaluation, education, and management at the request of Dr. Natale MilchLancaster. Last seen in pharmacy clinic on 06/23/2016.   Today, patient reports he has been out of meds x several weeks, has been trying to make insulins last and taking 4 units of novolog with breakfast and supper. Has been feeling a bit "weak" since out of meds. Endorses financial hardship 2/2 gambling. Gets paid next Thursday. That money will be going to catch up on bills. Hopes to have enough for medications.   Family/Social History: lost money gambling, long term issue  Patient denies adherence with medications.  Current diabetes medications include: Lantus 12 units daily, novolog 4 units BID (is supposed to be doing 4 units with breakfast, 6 units with dinner), jardiance 25 mg daily, metfomrin 1000 mg BID Current hypertension medications include: none  Patient denies hypoglycemic events.  Patient reported dietary habits: Eats 3 meals/day usually but "hasn't been eating like i've supposed to" since his sugars have been high/been out of meds Snacks: fruit (watermelon, peaches, pears, canteloupe) Endorses food insecurity.  Patient reported exercise habits: walking to store once/week   Patient reports nocturia 1-2 x/night Patient denies neuropathy. Patient reports visual changes. Patient reports self foot exams.   O:  Physical Exam  Vitals reviewed.    Review of Systems  All other systems reviewed and are negative.    Lab Results  Component Value Date   HGBA1C 7.1 04/21/2016   Vitals:   11/03/16 1041  BP: 128/68  Pulse: 83    Home fasting CBG: 200-270 after being out of meds for several weeks; usually runs in  80s-120s 2 hour post-prandial/random CBG: 200-300; usually runs 100s-low 200s  A/P: Diabetes longstanding currently  uncontrolled as evidenced by CBGs. Patient denies hypoglycemic events and is able to verbalize appropriate hypoglycemia management plan. Patient denies adherence with medication. Control is suboptimal due to lack of medication 2/2 financial hardship. -Provided patient with samples of tresiba, converted from lantus at equivalent dose - 12 units once daily -Provided patient with samples of novolog, changed dose to 4 units BID given food insecurity and decreased PO intake -Continued all other medications. Hopefully, patient will be able to purchase medications after next pay check -Referred patient to Gavin PoundDeborah, CSW for help with food insecurity  Next A1C anticipated in the next few months, limited by financial hardship. Do not think that getting an A1C today will add to the management of his T2DM given situation.    ASCVD risk greater than 7.5%. Continued Aspirin 81 mg and Continued atorvastatin 40 mg.   Written patient instructions provided.  Total time in face to face counseling 25 minutes.   Follow up with PCP in 1 month.   Patient seen with Devota Pacearoline Welles, PharmD PGY-2 Resident.

## 2016-11-03 NOTE — Patient Instructions (Addendum)
Thanks for coming to see us today!   1. Restart your lantus 12 units once a day  2. Restart your novolog 4 units with breakfast and 4 units with supper  Come back and see Dr. Natale MilchLancaster in ~ 1 month

## 2016-11-03 NOTE — Assessment & Plan Note (Signed)
Diabetes longstanding currently uncontrolled as evidenced by CBGs. Patient denies hypoglycemic events and is able to verbalize appropriate hypoglycemia management plan. Patient denies adherence with medication. Control is suboptimal due to lack of medication 2/2 financial hardship. -Provided patient with samples of tresiba, converted from lantus at equivalent dose - 12 units once daily -Provided patient with samples of novolog, changed dose to 4 units BID given food insecurity and decreased PO intake -Continued all other medications. Hopefully, patient will be able to purchase medications after next pay check -Referred patient to Gavin PoundDeborah, CSW for help with food insecurity

## 2016-12-08 ENCOUNTER — Ambulatory Visit: Payer: BLUE CROSS/BLUE SHIELD | Admitting: Internal Medicine

## 2016-12-15 ENCOUNTER — Encounter: Payer: Self-pay | Admitting: Pharmacist

## 2016-12-15 ENCOUNTER — Ambulatory Visit: Payer: BLUE CROSS/BLUE SHIELD | Admitting: Internal Medicine

## 2016-12-15 NOTE — Patient Instructions (Addendum)
Continue taking Lantus 12 units once daily with sample provided. Reviewed hypoglycemia management.   Medication Samples have been provided to the patient.  Drug name: Lantus (insulin glargine)  Qty: 2  LOT: [id number]  Exp.Date: 05/07/2018  The patient has been instructed regarding the correct time, dose, and frequency of taking this medication, including desired effects and most common side effects.   Jordan Rodriguez 11:59 AM 12/15/2016  Next Follow up with PCP:   12/29/2016

## 2016-12-29 ENCOUNTER — Encounter: Payer: Self-pay | Admitting: Internal Medicine

## 2016-12-29 ENCOUNTER — Ambulatory Visit (INDEPENDENT_AMBULATORY_CARE_PROVIDER_SITE_OTHER): Payer: BLUE CROSS/BLUE SHIELD | Admitting: Internal Medicine

## 2016-12-29 VITALS — BP 126/62 | HR 77 | Temp 98.3°F | Ht 66.0 in | Wt 132.2 lb

## 2016-12-29 DIAGNOSIS — IMO0001 Reserved for inherently not codable concepts without codable children: Secondary | ICD-10-CM

## 2016-12-29 DIAGNOSIS — E1165 Type 2 diabetes mellitus with hyperglycemia: Secondary | ICD-10-CM | POA: Diagnosis not present

## 2016-12-29 LAB — POCT GLYCOSYLATED HEMOGLOBIN (HGB A1C): Hemoglobin A1C: 8

## 2016-12-29 NOTE — Progress Notes (Signed)
Subjective:   Patient: Jordan Rodriguez       Birthdate: 03/25/1950       MRN: [id number]      HPI  Jordan Rodriguez is a 66 y.o. male presenting for DM f/u.   Type II DM Patient last seen by Dr. Raymondo BandKoval on 09/06. At that time, medication changes were made due to sub-optimal glycemic control as well as medication non-compliance due at least in part to financial instability. Patient was given samples of Tresiba (12U qd in place of Lantus) and Novolog (4U BID given food insecurity and decreased PO intake).  Since that time, patient has still been having trouble affording his medications. He only has enough Lantus and Novolog to last until Sunday. He has been able to afford his other medications typically, however does have some trouble affording Jardiance from time to time. Did meet with Sammuel Hineseborah Moore CSW and now does not have as much difficulty obtaining food. Is not able to measure blood sugar at home as frequently as he would like as he does not want to run out of test strips, but normally gets measurements around 150 post-prandial when he is taking his insulin, and around 200 when not taking insulin. Lowest measurement he can remember is in 50s-60s. This happened only once. He said he feels weak when his blood sugar is low. Denies any numbness or tingling other than occasional tingling at night that he attributes to sleeping on his arm or leg in an awkward position. Is unsure when he last saw ophthalmology due to cost issues, but says he thinks he needs reading glasses because he has difficulty seeing objects close up.   Smoking status reviewed. Patient is former smoker.   Review of Systems See HPI.     Objective:  Physical Exam  Constitutional: He is oriented to person, place, and time and well-developed, well-nourished, and in no distress.  HENT:  Head: Normocephalic and atraumatic.  Nose: Nose normal.  Mouth/Throat: Oropharynx is clear and moist. No oropharyngeal exudate.  Arcus  senilis present bilaterally   Eyes: Pupils are equal, round, and reactive to light. Conjunctivae and EOM are normal. Right eye exhibits no discharge. Left eye exhibits no discharge.  Cardiovascular: Normal rate, regular rhythm and normal heart sounds.   No murmur heard. Pulmonary/Chest: Effort normal and breath sounds normal. No respiratory distress. He has no wheezes.  Neurological: He is alert and oriented to person, place, and time.  Skin: Skin is warm and dry.  Psychiatric: Affect and judgment normal.     Assessment & Plan:  Diabetes mellitus type 2, uncontrolled, without complications Still poorly controlled. A1C increased to 8 today, from 7.1 in 03/2016. Likely 2/2 to medication non-adherence due to financial instability. [facility]. Is able to afford most other medications however. Only one episode of hypoglycemia with blood sugar in 50s, otherwise blood sugars between 150-200. No reports of or signs of neuropathy. Is due for ophtho appt, however patient cannot afford. Discussed with Dr. Raymondo BandKoval, who said unfortunately there are no Novlog samples today, but as patient should have enough to last through Sunday, he can call on Monday to see if any samples have come in. Dr. Raymondo BandKoval also willing to see patient again, so discussed scheduling another appt with Dr. Raymondo BandKoval with patient, who was amenable to this.  - Patient to call back Monday to inquire about Novolog samples and to schedule appt with Dr. Raymondo BandKoval  Precepted with Dr. Raymondo BandKoval.  Adin Hector, MD, MPH PGY-3 [facility] Pager [phone]

## 2016-12-29 NOTE — Patient Instructions (Addendum)
It was nice seeing you again today [name]!  Please continue taking your medications as you have been.   Unfortunately we do not have any Novolog samples today, but please call our office on Monday to ask Dr. Raymondo BandKoval if we have gotten any samples in. You can call our general office number [phone]. It would also be helpful to schedule another appointment with Dr. Raymondo BandKoval within the next few weeks.   If your blood sugar becomes very low, please do not take insulin, then call our office or go to the emergency room.   If you have any questions or concerns, please feel free to call the clinic.   Be well,  Dr. Natale MilchLancaster

## 2016-12-29 NOTE — Assessment & Plan Note (Signed)
Still poorly controlled. A1C increased to 8 today, from 7.1 in 03/2016. Likely 2/2 to medication non-adherence due to financial instability. [facility] still primary [facility] insulin. Is able to afford most other medications however. Only one episode of hypoglycemia with blood sugar in 50s, otherwise blood sugars between 150-200. No reports of or signs of neuropathy. Is due for ophtho appt, however patient cannot afford. Discussed with Dr. Raymondo BandKoval, who said unfortunately there are no Novlog samples today, but as patient should have enough to last through Sunday, he can call on Monday to see if any samples have come in. Dr. Raymondo BandKoval also willing to see patient again, so discussed scheduling another appt with Dr. Raymondo BandKoval with patient, who was amenable to this.  - Patient to call back Monday to inquire about Novolog samples and to schedule appt with Dr. Raymondo BandKoval

## 2017-01-03 ENCOUNTER — Telehealth: Payer: Self-pay | Admitting: Internal Medicine

## 2017-01-03 NOTE — Telephone Encounter (Signed)
He wants someone to look at how much is left of his Novolog and Lantus so he knows whether he has enough to last to his appt on 15th.  He was here but had to leave.

## 2017-01-05 NOTE — Telephone Encounter (Signed)
Unsure how much insulin patient has left as he has been receiving samples from Dr. Raymondo BandKoval. At our appt last week, he said he had at least enough insulin to last through the beginning of this week. He was instructed to call Dr. Raymondo BandKoval this past Monday to inquire about further samples, and to schedule an appt at his earliest convenience. Will let patient know of this.   Tarri AbernethyAbigail J Aleyssa Pike, MD, MPH PGY-3 [facility] Pager [phone]

## 2017-01-12 ENCOUNTER — Ambulatory Visit (INDEPENDENT_AMBULATORY_CARE_PROVIDER_SITE_OTHER): Payer: BLUE CROSS/BLUE SHIELD | Admitting: Pharmacist

## 2017-01-12 ENCOUNTER — Encounter: Payer: Self-pay | Admitting: Pharmacist

## 2017-01-12 DIAGNOSIS — Z794 Long term (current) use of insulin: Secondary | ICD-10-CM | POA: Diagnosis not present

## 2017-01-12 DIAGNOSIS — IMO0001 Reserved for inherently not codable concepts without codable children: Secondary | ICD-10-CM

## 2017-01-12 DIAGNOSIS — E1165 Type 2 diabetes mellitus with hyperglycemia: Secondary | ICD-10-CM

## 2017-01-12 MED ORDER — INSULIN GLARGINE 100 UNIT/ML SOLOSTAR PEN: 12.0000 [IU] | PEN_INJECTOR | Freq: Every day | SUBCUTANEOUS | 0 refills | Status: DC

## 2017-01-12 MED ORDER — INSULIN ASPART 100 UNIT/ML FLEXPEN: 4.0000 [IU] | PEN_INJECTOR | Freq: Two times a day (BID) | SUBCUTANEOUS | 0 refills | Status: DC

## 2017-01-12 MED ORDER — EMPAGLIFLOZIN 25 MG PO TABS: 25.0000 mg | ORAL_TABLET | Freq: Every day | ORAL | 3 refills | Status: DC

## 2017-01-12 NOTE — Assessment & Plan Note (Signed)
Diabetes longstanding currently controlled when on medications with elevated FBG off insulin. Patient reports hypoglycemic events and is able to verbalize appropriate hypoglycemia management plan. Patient denies adherence with medication. Control is suboptimal due to financial hardship causing medication noncompliance, dietary indiscretion/food insecurity - though food insecurity has improved this month. - Refilled basal insulin Lantus (insulin glargine) at 12 units daily; gave patient 2 pen samples at today's visit. - Continued rapid insulin Novolog (insulin aspart) at 4 units BID with meals; gave patient 1 pen at today's visit.  -Refilled Jardiance (empagliflozin) 25 mg once daily. Counseled on sick day rules

## 2017-01-12 NOTE — Patient Instructions (Signed)
Thanks for coming in to see us today!  We made no changes to your medications. We gave you some samples of your insulins that should get you through the end of the year. We also sent a prescription for your Jardiance to your pharmacy. Start taking all of your medications again.  Work on [employer] Holdingsyour diet and exercise to help control the high sugars. Try to replace your unhealthy snacks with something like peanut butter crackers.  Call us with any concerns.  Come back to see us in the Rx Clinic in January.

## 2017-01-12 NOTE — Progress Notes (Signed)
S:     Chief Complaint  Patient presents with  . Medication Management    diabetes    Patient arrives in good spirits, ambulating without assistance. Presents for diabetes evaluation, education, and management. Long-standing patient of Rx Clinic.  Patient was last seen by Primary Care Provider on 12/29/16.   Ran out of Lantus on Sunday. Took one leftover dose of Tresiba Monday or Tuesday. Now out of all long acting insulins. Has small amount of novolog left. Requests samples. Ran out of Jardiance 2-3 days ago but has the money to get more, plans to get it either today or tomorrow, requests refill of Jardiance. States he has been able to get food for the last month. Denies need for social work involvement today. Reports some hypoglycemia on weekends before lunch time because he eats earlier in the morning than usual. Wants to exercise more.   Patient reports Diabetes was diagnosed in 1998. Has been on insulin for most of his DM history.   Family/Social History: historically has issues with gambling, long term issue. 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   Patient denies adherence with medications.  Current diabetes medications include: Lantus 12 units daily, novolog 4 units BID with meal, jardiance 25 mg daily (out of this)  Patient denies hypoglycemic events since being out of insulin. Previously has had some in the 60s-70s in the afternoons before lunch at work. Treated low with regular lunchtime meal. States he eats peppermints if it is not mealtime to treat hypoglycemia.   Patient reported dietary habits: Eats 3 meals/day  Breakfast: frosted flakes, banana, milk Lunch: sandwich +/- chips Dinner: veggies x2 and a meat Snacks: oatmeal cakes ("a couple"), raisin cakes Drinks: water, coffee+splenda  Patient reported exercise habits: walking some, estimated at ~1 hour/week   Patient reports nocturia. 2x/night.  Patient denies pain/burning on urination. Patient denies  neuropathy. Patient reports visual changes when waking up. Patient reports self foot exams. No issues.  O:  Physical Exam  Constitutional: He appears well-developed and well-nourished.   Review of Systems  All other systems reviewed and are negative.  Lab Results  Component Value Date   HGBA1C 8.0 12/29/2016   Vitals:   01/12/17 1514  BP: 138/64   Patient-Reported CBG's: Home fasting: 162 this AM, previously 100s-120s on insulin 2 hour post-prandial/random: high 100s when he has insulin, as high as 364 when he was out of insulin and had significant dietary indiscretion.  A/P: Diabetes longstanding currently controlled when on medications with elevated FBG off insulin. Patient reports hypoglycemic events and is able to verbalize appropriate hypoglycemia management plan. Patient denies adherence with medication. Control is suboptimal due to financial hardship causing medication noncompliance, dietary indiscretion/food insecurity - though food insecurity has improved this month. - Refilled basal insulin Lantus (insulin glargine) at 12 units daily; gave patient 2 pen samples at today's visit. - Continued rapid insulin Novolog (insulin aspart) at 4 units BID with meals; gave patient 1 pen at today's visit.  -Refilled Jardiance (empagliflozin) 25 mg once daily. Counseled on sick day rules  Next A1C anticipated February 2019.    ASCVD risk greater than 7.5%. Continued aspirin 81 mg and Continued atorvastatin 40 mg.   Written patient instructions provided.  Total time in face to face counseling 20 minutes.   Follow up in Pharmacist Clinic Visit January, 2019.   Patient seen with Rip HarbourLiza Schimmelfing, PharmD Candidate, Verlan FriendsErin Deja, PharmD PGY-1 Resident, and Devota Pacearoline Welles, PharmD, PGY2 Resident.

## 2017-01-13 NOTE — Progress Notes (Signed)
Patient ID: Jordan Rodriguez, male   DOB: 10/30/1950, 66 y.o.   MRN: [id number] Reviewed: Agree with Dr. Koval's documentation and management.   

## 2017-02-09 ENCOUNTER — Other Ambulatory Visit: Payer: Self-pay | Admitting: Internal Medicine

## 2017-02-09 DIAGNOSIS — IMO0001 Reserved for inherently not codable concepts without codable children: Secondary | ICD-10-CM

## 2017-02-09 DIAGNOSIS — E1165 Type 2 diabetes mellitus with hyperglycemia: Principal | ICD-10-CM

## 2017-02-09 DIAGNOSIS — Z794 Long term (current) use of insulin: Principal | ICD-10-CM

## 2017-03-07 ENCOUNTER — Telehealth: Payer: Self-pay | Admitting: Internal Medicine

## 2017-03-07 DIAGNOSIS — IMO0001 Reserved for inherently not codable concepts without codable children: Secondary | ICD-10-CM

## 2017-03-07 DIAGNOSIS — E1165 Type 2 diabetes mellitus with hyperglycemia: Principal | ICD-10-CM

## 2017-03-07 DIAGNOSIS — Z794 Long term (current) use of insulin: Principal | ICD-10-CM

## 2017-03-07 MED ORDER — INSULIN GLARGINE 100 UNIT/ML SOLOSTAR PEN: 12.0000 [IU] | PEN_INJECTOR | Freq: Every day | SUBCUTANEOUS | 3 refills | Status: DC

## 2017-03-07 MED ORDER — INSULIN ASPART 100 UNIT/ML FLEXPEN: 4.0000 [IU] | PEN_INJECTOR | Freq: Two times a day (BID) | SUBCUTANEOUS | 3 refills | Status: DC

## 2017-03-07 NOTE — Telephone Encounter (Signed)
Pt is out of his insulin. It is the second day he hasnt had any. Please advise

## 2017-03-07 NOTE — Telephone Encounter (Signed)
Insulin refills sent in for both Novolog and Lantus. If patient calls back, please advise him in the future to ask his pharmacy to send refill request, and to send request at least 2 days prior to running out of prescription.   Tarri AbernethyAbigail J Tameisha Covell, MD, MPH PGY-3 [facility] Pager [phone]

## 2017-03-09 ENCOUNTER — Encounter: Payer: Self-pay | Admitting: Pharmacist

## 2017-03-09 ENCOUNTER — Ambulatory Visit (INDEPENDENT_AMBULATORY_CARE_PROVIDER_SITE_OTHER): Payer: Medicare PPO | Admitting: Pharmacist

## 2017-03-09 DIAGNOSIS — E1165 Type 2 diabetes mellitus with hyperglycemia: Secondary | ICD-10-CM | POA: Diagnosis not present

## 2017-03-09 DIAGNOSIS — IMO0001 Reserved for inherently not codable concepts without codable children: Secondary | ICD-10-CM

## 2017-03-09 DIAGNOSIS — Z794 Long term (current) use of insulin: Secondary | ICD-10-CM

## 2017-03-09 MED ORDER — INSULIN GLARGINE 100 UNIT/ML SOLOSTAR PEN: 12.0000 [IU] | PEN_INJECTOR | Freq: Every day | SUBCUTANEOUS | 6 refills | Status: DC

## 2017-03-09 MED ORDER — INSULIN ASPART 100 UNIT/ML FLEXPEN
4.0000 [IU] | PEN_INJECTOR | Freq: Two times a day (BID) | SUBCUTANEOUS | 0 refills | Status: DC
Start: 2017-03-09 — End: 2017-03-09

## 2017-03-09 MED ORDER — INSULIN ASPART 100 UNIT/ML FLEXPEN: 4.0000 [IU] | PEN_INJECTOR | Freq: Two times a day (BID) | SUBCUTANEOUS | 5 refills | Status: DC

## 2017-03-09 MED ORDER — ATORVASTATIN CALCIUM 40 MG PO TABS: 40.0000 mg | ORAL_TABLET | Freq: Every day | ORAL | 3 refills | Status: DC

## 2017-03-09 MED ORDER — EMPAGLIFLOZIN 25 MG PO TABS: 25.0000 mg | ORAL_TABLET | Freq: Every day | ORAL | 6 refills | Status: DC

## 2017-03-09 MED ORDER — INSULIN ASPART 100 UNIT/ML FLEXPEN: 4.0000 [IU] | PEN_INJECTOR | Freq: Two times a day (BID) | SUBCUTANEOUS | 6 refills | Status: DC

## 2017-03-09 NOTE — Patient Instructions (Addendum)
Thanks for coming to see us!   No changes to your medicines, we gave you samples today of your insulins.   Go to the pharmacy and see how much your insulins and jardiance will cost. Make sure you pick up your test strips as well  Know that the first fill will be the most expensive until you meet your deductible, then it should be more affordable.   Come back to see us in 3 weeks

## 2017-03-09 NOTE — Progress Notes (Signed)
Patient ID: Jordan Rodriguez, male   DOB: 04/28/1950, 66 y.o.   MRN: [id number] Reviewed: Agree with Dr. Koval's documentation and management.   

## 2017-03-09 NOTE — Progress Notes (Signed)
    S:     Chief Complaint  Patient presents with  . Medication Management    Diabetes    Patient arrives in good spirits and ambulating without assistance.  Presents for diabetes evaluation, education, and management. Long-standing patient of Rx Clinic.   Patient was last seen by Primary Care Provider on 12/29/16.   Today, patient reports he is doing well. Continues to gamble but reports he is doing this less. States he is out of some meds and test strips x several weeks. Patient reports [facility] has [facility] for drug coverage now however he is not sure what his copayments are.   Patient reports Diabetes was diagnosed in 1998. Has been on insulin for most of DM history.   Family/Social History: Patient attends community class providing food assistance, but has had trouble with transportation to meetings so he has missed a few.  Patient denies adherence with medications. Patient has not had atorvastatin for 2 weeks nor Lantus since 03/05/17, but has been using Novolog. Current diabetes medications include: insulin glargine 12 units, insulin aspart 4 units twice daily with meals, jardiance 25mg  daily, metformin 1000 twice daily  Patient denies hypoglycemic events.  Patient reported dietary habits: Eats 2-3 meals/day Breakfast:cinnamon frosted flakes, 2% milk, banana Lunch: peanut butter and jelly Dinner: hamburger, fried okra, corn on the cob Drinks:decaf coffee  Patient reports self foot exams. Denies issues.  O:  Physical Exam  Constitutional: He appears well-developed and well-nourished.   Review of Systems  All other systems reviewed and are negative.   Lab Results  Component Value Date   HGBA1C 8.0 12/29/2016   Vitals:   03/09/17 1532  BP: 138/78  Pulse: 63  SpO2: 98%   Home blood glucose meter shows fasting BG 90-130, postprandial 180-190. Of note, these readings are from mid-December   A/P: Diabetes longstanding with unknown control however  previously well controlled per CBG readings from meter . Patient denies hypoglycemic events and is able to verbalize appropriate hypoglycemia management plan. Patient denies adherence with medication. Control is suboptimal due to medication nonadherence due to financial hardship, food insecurity. -Refilled Lantus - continued at 12 units daily - provided with sample pen  -Refilled Novolog - continued at 4 units twice daily with meals - provided with sample pen -Refilled Jardiance 25 mg daily -Next A1C anticipated February 2019.  Medication Samples have been provided to the patient.  Drug name: Lantus Solostar       Strength: U100        Qty: 1pen  LOT: [id number]: [id number]  Exp.Date: 07/29/2019  Dosing instructions: Inject 12 units daily  Drug name: Novolog Flexpen       Strength: U100        Qty: 1pen  LOT: [id number]: [id number] Exp.Date: 09/27/2017  Dosing instructions: Inject 4 units with breakfast and supper  The patient has been instructed regarding the correct time, dose, and frequency of taking this medication, including desired effects and most common side effects.   ASCVD risk greater than 7.5%. Continued Aspirin 81 mg and Continued atorvastatin 80 mg.   Written patient instructions provided.  Total time in face to face counseling 20 minutes.   Follow up in Pharmacist Clinic Visit 3 weeks.   Patient seen with Remus LofflerGloria Adedoyin, PharmD Candidate and Devota Pacearoline Welles, PGY2 Pharmacy Resident, PharmD, BCPS.

## 2017-03-09 NOTE — Assessment & Plan Note (Signed)
Diabetes longstanding with unknown control however previously well controlled per CBG readings from meter . Patient denies hypoglycemic events and is able to verbalize appropriate hypoglycemia management plan. Patient denies adherence with medication. Control is suboptimal due to medication nonadherence due to financial hardship, food insecurity. -Refilled Lantus - continued at 12 units daily - provided with sample pen  -Refilled Novolog - continued at 4 units twice daily with meals - provided with sample pen -Refilled Jardiance 25 mg daily -Next A1C anticipated February 2019.  Medication Samples have been provided to the patient.  Drug name: Lantus Solostar       Strength: U100        Qty: 1pen  LOT: [id number]: [id number]  Exp.Date: 07/29/2019  Dosing instructions: Inject 12 units daily  Drug name: Novolog Flexpen       Strength: U100        Qty: 1pen  LOT: [id number]: [id number] Exp.Date: 09/27/2017  Dosing instructions: Inject 4 units with breakfast and supper  The patient has been instructed regarding the correct time, dose, and frequency of taking this medication, including desired effects and most common side effects.

## 2017-04-06 ENCOUNTER — Encounter: Payer: Self-pay | Admitting: Pharmacist

## 2017-04-06 ENCOUNTER — Ambulatory Visit (INDEPENDENT_AMBULATORY_CARE_PROVIDER_SITE_OTHER): Payer: Medicare PPO | Admitting: Pharmacist

## 2017-04-06 VITALS — BP 128/68 | HR 78 | Ht 66.0 in | Wt 133.6 lb

## 2017-04-06 DIAGNOSIS — E1165 Type 2 diabetes mellitus with hyperglycemia: Secondary | ICD-10-CM | POA: Diagnosis not present

## 2017-04-06 DIAGNOSIS — Z794 Long term (current) use of insulin: Secondary | ICD-10-CM | POA: Diagnosis not present

## 2017-04-06 DIAGNOSIS — IMO0001 Reserved for inherently not codable concepts without codable children: Secondary | ICD-10-CM

## 2017-04-06 LAB — POCT GLYCOSYLATED HEMOGLOBIN (HGB A1C): HEMOGLOBIN A1C: 8

## 2017-04-06 MED ORDER — INSULIN GLARGINE 100 UNIT/ML SOLOSTAR PEN: 12.0000 [IU] | PEN_INJECTOR | Freq: Every day | SUBCUTANEOUS | 0 refills | Status: DC

## 2017-04-06 NOTE — Progress Notes (Signed)
Patient ID: Jordan Rodriguez, male   DOB: 06/22/1950, 66 y.o.   MRN: [id number] Reviewed: Agree with Dr. Koval's documentation and management.   

## 2017-04-06 NOTE — Progress Notes (Signed)
    S:     Chief Complaint  Patient presents with  . Medication Management    Diabetes    Patient arrives in good spirits, ambulating without assistance.  Presents for diabetes evaluation, education, and management. Patient is well known to pharmacy clinic. Last seen by PCP on 12/29/2016.   Today, patient reports that [facility] suggesting that he may be eligible for LIS/extra help. Patient grades his medication adherence at a "C" when prompted. He reports he has all his oral medications and novolog but he ran out of Lantus 2-3 days ago. He denies frequent hypoglycemia (states "I've had maybe 1?") and food insecurity at present.   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   Patient denies adherence with Lantus. He reports adherence with all other medications.   Current diabetes medications include: Lantus 12 units daily, novolog 4 units BID, metformin 1000 mg BID, Jardiance 25 mg daily  O:  Physical Exam  Constitutional: He appears well-developed and well-nourished.     Review of Systems  All other systems reviewed and are negative.    Lab Results  Component Value Date   HGBA1C 8.0 04/06/2017   Vitals:   04/06/17 1113  BP: 128/68  Pulse: 78  SpO2: 97%   Per meter download: 14 day avg = 140 mg/dL, 30 day avg = 161128 mg/dL  A/P: Diabetes longstanding currently reasonably controlled in a patient with frequent food insecurity and history of hypoglycemia. A1C unchanged from previous. Patient largely denies hypoglycemic events and is able to verbalize appropriate hypoglycemia management plan. Patient denies adherence with Lantus, otherwise adherent to all other medication. Control is suboptimal due to financial limitation, food insecurity.  -No changes to medications today: continue Lantus 12 units daily, novolog 4 units BID, metformin 1000 mg BID, Jardiance 25 mg daily. -Filled out  application for low income subsidy/extra help. Patient will receive communication from CMS in the mail in 2-3 weeks.  -Provided sample for Lantus pen. - Next A1C anticipated 07/04/2017 or later  ASCVD risk greater than 7.5%. Continued Aspirin 81 mg and Continued atorvastatin 40 mg.   Written patient instructions provided.  Total time in face to face counseling 25 minutes.   Follow up in Pharmacist Clinic Visit 6-8 weeks.   Patient seen with Remus LofflerGloria Adedoyin, PharmD Candidate, Adline PotterSabrina Dunham, PharmD, and Devota Pacearoline Welles, PGY2 Pharmacy Resident, PharmD, BCPS.

## 2017-04-06 NOTE — Assessment & Plan Note (Signed)
Diabetes longstanding currently reasonably controlled in a patient with frequent food insecurity and history of hypoglycemia. A1C unchanged from previous. Patient largely denies hypoglycemic events and is able to verbalize appropriate hypoglycemia management plan. Patient denies adherence with Lantus, otherwise adherent to all other medication. Control is suboptimal due to financial limitation, food insecurity.  -No changes to medications today: continue Lantus 12 units daily, novolog 4 units BID, metformin 1000 mg BID, Jardiance 25 mg daily. -Filled out application for low income subsidy/extra help. Patient will receive communication from CMS in the mail in 2-3 weeks.  -Provided sample for Lantus pen. - Next A1C anticipated 07/04/2017 or later

## 2017-04-06 NOTE — Patient Instructions (Signed)
Thanks for coming to see us today!  Youre doing pretty well when you have your medicine!   We gave you a sample of Lantus today and filled out an application for Low Income Subsidy/Extra help. You will get a letter in the mail from Medicare that tells you if you qualified or not.   No changes to your medicine doses, keep checking your blood sugar like you have been.   Come back to see us in 6-8 weeks.

## 2017-04-25 ENCOUNTER — Telehealth: Payer: Self-pay | Admitting: Internal Medicine

## 2017-04-25 NOTE — Telephone Encounter (Signed)
Pt wants to talk to dr Raymondo Bandkoval about his medicines. The lady was doing some paperwork to get his prescription prices lowered. He wants to find out about this

## 2017-04-27 NOTE — Telephone Encounter (Signed)
Returned call to patient to review his "approval for Low Income Subsidy" - (LIS).  He was told that his approval means that his next prescription costs.   Patient has scheduled follow-up on 3/21 in RX clinic.   Patient was excited to hear news of lower co-payments AND waived deductible with his [employer] [place].

## 2017-04-27 NOTE — Telephone Encounter (Signed)
Patient has not received a call back yet to speak about his medications.  Please call him at [phone].

## 2017-05-18 ENCOUNTER — Encounter: Payer: Self-pay | Admitting: Pharmacist

## 2017-05-18 ENCOUNTER — Ambulatory Visit (INDEPENDENT_AMBULATORY_CARE_PROVIDER_SITE_OTHER): Payer: BLUE CROSS/BLUE SHIELD | Admitting: Pharmacist

## 2017-05-18 DIAGNOSIS — Z794 Long term (current) use of insulin: Secondary | ICD-10-CM | POA: Diagnosis not present

## 2017-05-18 DIAGNOSIS — IMO0001 Reserved for inherently not codable concepts without codable children: Secondary | ICD-10-CM

## 2017-05-18 DIAGNOSIS — E1165 Type 2 diabetes mellitus with hyperglycemia: Secondary | ICD-10-CM | POA: Diagnosis not present

## 2017-05-18 MED ORDER — INSULIN PEN NEEDLE 32G X 4 MM MISC: 1.0000 | Freq: Three times a day (TID) | 6 refills | Status: DC

## 2017-05-18 MED ORDER — EMPAGLIFLOZIN 25 MG PO TABS: 25.0000 mg | ORAL_TABLET | Freq: Every day | ORAL | 2 refills | Status: DC

## 2017-05-18 MED ORDER — EMPAGLIFLOZIN 25 MG PO TABS: 25.0000 mg | ORAL_TABLET | Freq: Every day | ORAL | 6 refills | Status: DC

## 2017-05-18 NOTE — Patient Instructions (Addendum)
It was great to see you today. I am really pleased with how well you are doing!   1. I sent in a refill for your Jardiance and pen needles (for the BD brand of pen needles).  2. Keep up the good work. No changes with your medications today!  3. I would like to check your hemoglobin A1c in May with Dr. Natale MilchLancaster.   Follow-up appointment in early May with Dr. Natale MilchLancaster. I will see you back if you have any concerns. Call me if there are any problems with obtaining your medications.

## 2017-05-18 NOTE — Assessment & Plan Note (Signed)
Diabetes longstanding/newly diagnosed currently under better control with ability to obtain medications and steady supply of food recently. Patient reports hypoglycemic events and is able to verbalize appropriate hypoglycemia management plan. Patient reports adherence with medication with exception of recently running out of supply of Jardiance. A 90-day supply of this medication was sent to his pharmacy. Control is much better at this time, will continue current regimen of medications and patient verbalized understanding. Continued basal insulin Lantus (insulin glargine) 12 units daily. Continued rapid insulin Novolog (insulin aspart) a 4 units twice a day with meals.  Continued Jardiance (empagliflozin) at 25mg  by mouth daily.

## 2017-05-18 NOTE — Progress Notes (Signed)
    S:  Patient arrives in a pleasant mood, self-ambulating.   Presents for diabetes evaluation, education, and management. Patient well known to Pharmacy Clinic. Patient reports he has been able to obtain a 3 month supply of his novolog and Lantus insulins for $20 each, he was quite happy about this. Patient endorses running out of his Jardiance about 3 days ago. Patient has glucometer with him at appointment. Patient reports he usually only eats 2 meals a day, if he eats a third meal he does not take any insulin with that meal.  Patient reports he mostly drinks water and decaf coffee with splenda and 2% milk, is not currently drinking soda. Patient denies food insecurity at this time, has 2 days supply of food on hand.   Patient reports adherence with medications with exception that he ran out of Jardiance a few days ago.  Current diabetes medications include: Jardiance 25mg  daily, Lantus 12 units daily, Novolog 4 units twice daily, metformin 1000mg  twice daily.  Current hypertension medications include: none   Patient reports hypoglycemic events. When he had this event he took his insulin and then did not eat his meal immediately after.    Patient reports nocturia. --waking up about once or twice a night Patient denies neuropathy. Patient denies visual changes. Patient reports self foot exams. --no noticeable changes to the feet per patient   O:    Lab Results  Component Value Date   HGBA1C 8.0 04/06/2017   Per meter review blood glucose mostly in the 100s, some outliers in the 300s and a few lows. Average blood glucose around 150.   A/P: Diabetes longstanding/newly diagnosed currently under better control with ability to obtain medications and steady supply of food recently. Patient reports hypoglycemic events and is able to verbalize appropriate hypoglycemia management plan. Patient reports adherence with medication with exception of recently running out of supply of Jardiance. A 90-day  supply of this medication was sent to his pharmacy. Control is much better at this time, will continue current regimen of medications and patient verbalized understanding. Continued basal insulin Lantus (insulin glargine) 12 units daily. Continued rapid insulin Novolog (insulin aspart) a 4 units twice a day with meals.  Continued Jardiance (empagliflozin) at 25mg  by mouth daily.  Next A1C anticipated early May 2019.  ASCVD risk greater than 7.5%. Continued Aspirin 81 mg and Continued atorvastatin 40 mg.    Written patient instructions provided.  Total time in face to face counseling 15 minutes.   Follow up in Pharmacist Clinic Visit as needed, will follow up with PCP Dr. Natale MilchLancaster in early May.   Patient seen with Rodolph Bonghris Wang, PharmD Candidate and Blake DivineShannon Parkey, PharmD, PGY1 Resident.

## 2017-07-03 ENCOUNTER — Encounter: Payer: Self-pay | Admitting: Internal Medicine

## 2017-07-03 ENCOUNTER — Other Ambulatory Visit: Payer: Self-pay

## 2017-07-03 ENCOUNTER — Ambulatory Visit (INDEPENDENT_AMBULATORY_CARE_PROVIDER_SITE_OTHER): Payer: Medicare PPO | Admitting: Internal Medicine

## 2017-07-03 VITALS — BP 116/60 | HR 84 | Temp 98.3°F | Wt 128.0 lb

## 2017-07-03 DIAGNOSIS — E1165 Type 2 diabetes mellitus with hyperglycemia: Secondary | ICD-10-CM

## 2017-07-03 DIAGNOSIS — IMO0001 Reserved for inherently not codable concepts without codable children: Secondary | ICD-10-CM

## 2017-07-03 LAB — POCT GLYCOSYLATED HEMOGLOBIN (HGB A1C): Hemoglobin A1C: 8.2

## 2017-07-03 MED ORDER — INSULIN PEN NEEDLE 32G X 4 MM MISC: 1.0000 | Freq: Three times a day (TID) | 6 refills | Status: DC

## 2017-07-03 NOTE — Progress Notes (Signed)
   Subjective:   Patient: Jordan Rodriguez       Birthdate: May 14, 1950       MRN: [id number]      HPI  ALDAIR RICKEL is a 67 y.o. male presenting for DM f/u.   Type II DM Currently taking Lantus 12U, Novolog 4U BID (with breakfast and dinner), and Jardiance  qd. Skips second dose of Novolog if he doesn't eat dinner (sometimes only eats two meals per day). Lowest blood sugar 75 (fasting). Highest in 200s. Average fasting around 110. Average post-prandial also in low 100s, sometimes in 90s. Denies vision changes. Endorses infrequent tingling in L leg at nighttime but no tingling or numbness in feet or hands.   Smoking status reviewed. Patient is former smoker.   Review of Systems See HPI.     Objective:  Physical Exam  Constitutional: He is oriented to person, place, and time. He appears well-developed and well-nourished. No distress.  HENT:  Head: Normocephalic and atraumatic.  Pulmonary/Chest: Effort normal. No respiratory distress.  Neurological: He is alert and oriented to person, place, and time.  Skin: Skin is warm and dry.  Psychiatric: He has a normal mood and affect. His behavior is normal.      Assessment & Plan:  Diabetes mellitus type 2, uncontrolled, without complications A1C slightly elevated today at 8.2 compared to 8 at last visit 68mo ago. Reported blood sugars at home with good glycemic control. Given some blood sugars on lower side, will continue with current med regimen. If A1C continues to increase at next visit, could consider increasing Novolog to 5 or 6 units BID. Discussed with Dr. Raymondo Band who is in agreement with this plan. Regarding leg tingling, as present only in L leg (not foot), only at night, and occurring infrequently, less concern for diabetic neuropathy.  - F/u in 3 mo for A1C - Continue current regimen of Lantus 12U, Novolog 4U BID, Jardiance  qd - Continue to monitor for development of neuropathy   Tarri Abernethy, MD,  MP[facility] PGY-3 [facility] Pager [phone]

## 2017-07-03 NOTE — Patient Instructions (Signed)
It was nice seeing you again today [name]!  Please continue taking your medication as you have been (Lantus 12 units a day, Novolog 4 units with breakfast and dinner, Jardiance 25 mg a day). If you notice that you are frequently having low blood sugars (less than 80), please call to let us know.   I will see you back in 3 months to recheck your A1C.   If you have any questions or concerns, please feel free to call the clinic.   Be well,  Dr. Natale Milch

## 2017-07-03 NOTE — Assessment & Plan Note (Signed)
A1C slightly elevated today at 8.2 compared to 8 at last visit 33mo ago. Reported blood sugars at home with good glycemic control. Given some blood sugars on lower side, will continue with current med regimen. If A1C continues to increase at next visit, could consider increasing Novolog to 5 or 6 units BID. Discussed with Dr. Raymondo Band who is in agreement with this plan. Regarding leg tingling, as present only in L leg (not foot), only at night, and occurring infrequently, less concern for diabetic neuropathy.  - F/u in 3 mo for A1C - Continue current regimen of Lantus 12U, Novolog 4U BID, Jardiance  qd - Continue to monitor for development of neuropathy

## 2017-09-12 ENCOUNTER — Other Ambulatory Visit: Payer: Self-pay | Admitting: Family Medicine

## 2017-09-12 DIAGNOSIS — Z794 Long term (current) use of insulin: Principal | ICD-10-CM

## 2017-09-12 DIAGNOSIS — E1165 Type 2 diabetes mellitus with hyperglycemia: Principal | ICD-10-CM

## 2017-09-12 DIAGNOSIS — IMO0001 Reserved for inherently not codable concepts without codable children: Secondary | ICD-10-CM

## 2017-10-16 ENCOUNTER — Other Ambulatory Visit: Payer: Self-pay | Admitting: Family Medicine

## 2017-10-16 ENCOUNTER — Encounter: Payer: Self-pay | Admitting: Pharmacist

## 2017-10-16 ENCOUNTER — Ambulatory Visit (INDEPENDENT_AMBULATORY_CARE_PROVIDER_SITE_OTHER): Payer: Medicare PPO | Admitting: Pharmacist

## 2017-10-16 DIAGNOSIS — IMO0001 Reserved for inherently not codable concepts without codable children: Secondary | ICD-10-CM

## 2017-10-16 DIAGNOSIS — E7800 Pure hypercholesterolemia, unspecified: Secondary | ICD-10-CM

## 2017-10-16 DIAGNOSIS — E1165 Type 2 diabetes mellitus with hyperglycemia: Secondary | ICD-10-CM | POA: Diagnosis not present

## 2017-10-16 LAB — POCT GLYCOSYLATED HEMOGLOBIN (HGB A1C): HBA1C, POC (CONTROLLED DIABETIC RANGE): 7.6 % — AB (ref 0.0–7.0)

## 2017-10-16 NOTE — Patient Instructions (Addendum)
It was great to see you today!  1. You are doing great with managing your blood sugar! Please call us if you see sugars in the 70s more often than usual. 2. Continue current medications as you have before. 3. Check in with Dr. Darin EngelsAbraham about your tingling in your arms and legs, and about waking up to go to the bathroom more than usual.

## 2017-10-16 NOTE — Addendum Note (Signed)
Addended by: Jennette BillBUSICK, ROBERT L on: 10/16/2017 01:35 PM   Modules accepted: Orders

## 2017-10-16 NOTE — Addendum Note (Signed)
Addended by: Jennette BillBUSICK, ROBERT L on: 10/16/2017 01:47 PM   Modules accepted: Orders

## 2017-10-16 NOTE — Assessment & Plan Note (Signed)
Diabetes longstanding currently overall better controlled. Patient is able to verbalize appropriate hypoglycemia management plan. Patient is adherent with medication and is able to maintain an overall steady supply of food with occasionally missing one meal a day. Will continue current diabetic regimen today.  A1C measured today was at goal.  -Continue Lantus (insulin glargine) 12 units daily -Continue Novolog (insulin aspart) 4 units with breakfast and dinner -Continue Jardiance (empagliflozine) 25 mg daily.  -Extensively discussed pathophysiology of DM, recommended lifestyle interventions, dietary effects on glycemic control -Counseled on s/sx of and management of hypoglycemia -Next A1C anticipated in 6 months.

## 2017-10-16 NOTE — Progress Notes (Signed)
S:     Chief Complaint  Patient presents with  . Medication Management    Diabetes    Patient arrives ambulating unassisted and in good spirits.  Presents for diabetes evaluation, education, and management at the request of Dr. Natale MilchLancaster. Patient was last seen by Primary Care Provider and referred on 07/03/17.   Patient reports adherence with medications.  Current diabetes medications include: Novolog (insulin aspart) 4 Units with breakfast and dinner, Lantus (insulin glargine) 12 units daily, Jardiance (empagliflozin) 25mg  daily   Patient denies hypoglycemic events. Blood glucose meter does show some values in the low 70s - patient denies any symptoms/signs of hypoglycemia during those times.   Also reports worsening upper and lower extremity tingling that is bothersome and usually occurs while he is laying down. He also has noticed increased night time awakening for urination which he reports occurring about 2 times per night on most nights of the week.   Patient reported dietary habits: Usually eats 3 meals/day. Patient states he never goes a day without eating, but will occasionally skip a meal.  Breakfast: cereal, banana, bacon, eggs, pancakes  Lunch: bbq sandwich, french fries, hot dog Dinner: veggies, meat, potatoes, white rice    Patient-reported exercise habits: reports not exercising much    Patient reports nocturia. - waking up about 2 times on most nights of the week  Patient denies visual changes. Patient reports occasional foot exams, reports no sores or cuts. Diabetic Foot Exam conducted at today's visit; nothing abnormal to note.   O:  Physical Exam  Constitutional: He appears well-developed and well-nourished.  Cardiovascular:  Pulses:      Dorsalis pedis pulses are 1+ on the right side, and 1+ on the left side.       Posterior tibial pulses are 1+ on the right side, and 1+ on the left side.  Musculoskeletal: He exhibits no edema.  Feet:  Right Foot:    Protective Sensation: 5 sites tested. 5 sites sensed.  Skin Integrity: Negative for ulcer, blister or skin breakdown.  Left Foot:  Protective Sensation: 5 sites tested. 5 sites sensed.  Skin Integrity: Negative for ulcer, blister or skin breakdown.  Vitals reviewed.    Review of Systems  All other systems reviewed and are negative.    Lab Results  Component Value Date   HGBA1C 8.2 07/03/2017   There were no vitals filed for this visit.  Lipid Panel     Component Value Date/Time   CHOL 141 07/19/2013 0913   TRIG 62 07/19/2013 0913   HDL 39 (L) 07/19/2013 0913   CHOLHDL 3.6 07/19/2013 0913   VLDL 12 07/19/2013 0913   LDLCALC 90 07/19/2013 0913   LDLDIRECT 49 12/24/2015 1154    14-day average: 127 30-day average: 134 Fasting: 75-low 100s Random: 80-200 (with most values being in the 100s)  Hemoglobin A1c today: 7.6% (improvement from 8.2%)   A/P: Diabetes longstanding currently overall better controlled. Patient is able to verbalize appropriate hypoglycemia management plan. Patient is adherent with medication and is able to maintain an overall steady supply of food with occasionally missing one meal a day. Will continue current diabetic regimen today.  A1C measured today was at goal.  -Continue Lantus (insulin glargine) 12 units daily -Continue Novolog (insulin aspart) 4 units with breakfast and dinner -Continue Jardiance (empagliflozine) 25 mg daily.  -Extensively discussed pathophysiology of DM, recommended lifestyle interventions, dietary effects on glycemic control -Counseled on s/sx of and management of hypoglycemia -Next A1C anticipated  in 6 months.   ASCVD risk - primary prevention in patient with DM. Last LDL is controlled. ASCVD risk score is not >20%   -Continued aspirin 81 mg daily -Continued Atorvastatin 40 mg.  Lipid Panel ordered today.   Lower Urinary Tract Symptoms -Referred to PCP for follow-up  Bilateral Extremity Tingling -Referred to PCP for  follow-up  Written patient instructions provided.  Total time in face to face counseling 40 minutes.   Follow up with Dr. Darin EngelsAbraham in clinic to discuss LUTS and bilateral extremity tingling. Patient seen with Caffie PintoAkshara Kumar, PharmD Candidate, Gwynneth AlbrightSara Nimer, PharmD, PGY1 Pharmacy Resident.

## 2017-10-16 NOTE — Assessment & Plan Note (Signed)
ASCVD risk - primary prevention in patient with DM. Last LDL is controlled. ASCVD risk score is not >20%   -Continued aspirin 81 mg daily -Continued Atorvastatin 40 mg.  Lipid Panel ordered today.

## 2017-10-17 LAB — LIPID PANEL
CHOL/HDL RATIO: 2.8 ratio (ref 0.0–5.0)
Cholesterol, Total: 67 mg/dL — ABNORMAL LOW (ref 100–199)
HDL: 24 mg/dL — ABNORMAL LOW (ref >39)
LDL Calculated: 31 mg/dL (ref 0–99)
Triglycerides: 59 mg/dL (ref 0–149)
VLDL Cholesterol Cal: 12 mg/dL (ref 5–40)

## 2017-10-17 LAB — COMPREHENSIVE METABOLIC PANEL
A/G RATIO: 1.6 (ref 1.2–2.2)
ALT: 33 IU/L (ref 0–44)
AST: 39 IU/L (ref 0–40)
Albumin: 4.4 g/dL (ref 3.6–4.8)
Alkaline Phosphatase: 110 IU/L (ref 39–117)
BUN/Creatinine Ratio: 14 (ref 10–24)
BUN: 19 mg/dL (ref 8–27)
Bilirubin Total: 0.3 mg/dL (ref 0.0–1.2)
CALCIUM: 9.1 mg/dL (ref 8.6–10.2)
CO2: 22 mmol/L (ref 20–29)
Chloride: 101 mmol/L (ref 96–106)
Creatinine, Ser: 1.35 mg/dL — ABNORMAL HIGH (ref 0.76–1.27)
GFR calc Af Amer: 63 mL/min/{1.73_m2} (ref >59)
GFR, EST NON AFRICAN AMERICAN: 54 mL/min/{1.73_m2} — AB (ref >59)
GLOBULIN, TOTAL: 2.8 g/dL (ref 1.5–4.5)
Glucose: 150 mg/dL — ABNORMAL HIGH (ref 65–99)
POTASSIUM: 5.1 mmol/L (ref 3.5–5.2)
SODIUM: 137 mmol/L (ref 134–144)
TOTAL PROTEIN: 7.2 g/dL (ref 6.0–8.5)

## 2017-11-24 ENCOUNTER — Other Ambulatory Visit: Payer: Self-pay | Admitting: Family Medicine

## 2017-11-24 DIAGNOSIS — E1165 Type 2 diabetes mellitus with hyperglycemia: Principal | ICD-10-CM

## 2017-11-24 DIAGNOSIS — Z794 Long term (current) use of insulin: Principal | ICD-10-CM

## 2017-11-24 DIAGNOSIS — IMO0001 Reserved for inherently not codable concepts without codable children: Secondary | ICD-10-CM

## 2017-11-27 ENCOUNTER — Other Ambulatory Visit: Payer: Self-pay

## 2017-11-27 ENCOUNTER — Encounter: Payer: Self-pay | Admitting: Family Medicine

## 2017-11-27 ENCOUNTER — Ambulatory Visit (INDEPENDENT_AMBULATORY_CARE_PROVIDER_SITE_OTHER): Payer: Medicare PPO | Admitting: Family Medicine

## 2017-11-27 VITALS — BP 124/62 | HR 75 | Temp 97.6°F | Wt 128.4 lb

## 2017-11-27 DIAGNOSIS — E1165 Type 2 diabetes mellitus with hyperglycemia: Secondary | ICD-10-CM | POA: Diagnosis not present

## 2017-11-27 DIAGNOSIS — E119 Type 2 diabetes mellitus without complications: Secondary | ICD-10-CM | POA: Diagnosis not present

## 2017-11-27 DIAGNOSIS — Z0100 Encounter for examination of eyes and vision without abnormal findings: Principal | ICD-10-CM

## 2017-11-27 DIAGNOSIS — Z0000 Encounter for general adult medical examination without abnormal findings: Secondary | ICD-10-CM | POA: Diagnosis not present

## 2017-11-27 DIAGNOSIS — IMO0001 Reserved for inherently not codable concepts without codable children: Secondary | ICD-10-CM

## 2017-11-27 DIAGNOSIS — R200 Anesthesia of skin: Secondary | ICD-10-CM | POA: Diagnosis not present

## 2017-11-27 DIAGNOSIS — Z23 Encounter for immunization: Secondary | ICD-10-CM

## 2017-11-27 DIAGNOSIS — R202 Paresthesia of skin: Secondary | ICD-10-CM | POA: Diagnosis not present

## 2017-11-27 NOTE — Assessment & Plan Note (Signed)
Flu shot given today

## 2017-11-27 NOTE — Assessment & Plan Note (Signed)
Likely secondary to position changes patient notes resolution of symptoms were not laying on a particular side.  Could also consider component of hypoglycemia as symptoms have resolved after having medication changes by Dr. Raymondo Band.  Advised patient to keep a diary and record blood sugars when these events happen.  Patient has normal strength and sensation on physical exam which is reassuring.  Do not think further testing is required at this time.  Advised patient to follow-up in 1 month if no improvement.  Patient also follow-up closely with Dr. Raymondo Band for diabetes management.

## 2017-11-27 NOTE — Progress Notes (Signed)
   Subjective:    Patient ID: Jordan Rodriguez, male    DOB: May 28, 1950, 67 y.o.   MRN: [id number]   CC: tingling in arms and legs  HPI: Tingling Patient presenting with bilateral tingling and upper and lower extremities.  Patient is unsure when the symptoms began but were occurring 1-2 times a week.  Symptoms have stopped since seeing Dr. Raymondo Band in August.  States that symptoms are mainly in his upper extremities.  Patient states that symptoms occur mainly when he is laying on a certain side.  States that symptoms resolve after he rolls over but then start on the neck side.  States that symptoms also occur when his sugars are low, but his sugars have been well controlled since starting new medications per Dr. Raymondo Band in August.  Patient also notes that sometimes his hands get shaky after he drinks coffee despite changing to decaf coffee as well.  The symptoms have also improved after having diabetic medications changed.   Objective:  BP 124/62   Pulse 75   Temp 97.6 F (36.4 C) (Oral)   Wt 128 lb 6.4 oz (58.2 kg)   SpO2 99%   BMI 20.72 kg/m  Vitals and nursing note reviewed  General: well nourished, in no acute distress HEENT: normocephalic, PERRL, no scleral icterus or conjunctival pallor, no nasal discharge, moist mucous membranes  Cardiac: RRR, clear S1 and S2, no murmurs, rubs, or gallops Respiratory: clear to auscultation bilaterally, no increased work of breathing Abdomen: soft, nontender, nondistended, no masses or organomegaly. Bowel sounds present Extremities: no edema or cyanosis. Warm, well perfused. 2+ radial pulses bilaterally, 5/5 muscle strength bilaterally, normal grip strength, full ROM, normal sensation bilaterally  Skin: warm and dry, no rashes noted Neuro: alert and oriented, no focal deficits   Assessment & Plan:    Bilateral arm numbness and tingling while sleeping Likely secondary to position changes patient notes resolution of symptoms were not laying on a  particular side.  Could also consider component of hypoglycemia as symptoms have resolved after having medication changes by Dr. Raymondo Band.  Advised patient to keep a diary and record blood sugars when these events happen.  Patient has normal strength and sensation on physical exam which is reassuring.  Do not think further testing is required at this time.  Advised patient to follow-up in 1 month if no improvement.  Patient also follow-up closely with Dr. Raymondo Band for diabetes management.  Diabetes mellitus type 2, uncontrolled, without complications Patient due for diabetic eye exam.  Patient does not have an ophthalmologist.  Referral placed for ophthalmology  Routine health maintenance Flu shot given today    Return in about 4 weeks (around 12/25/2017) for Diabetes Raymondo Band or PCP).   Oralia Manis, DO, PGY-2

## 2017-11-27 NOTE — Assessment & Plan Note (Signed)
Patient due for diabetic eye exam.  Patient does not have an ophthalmologist.  Referral placed for ophthalmology

## 2017-11-27 NOTE — Patient Instructions (Signed)
It was a pleasure seeing you today.   Today we discussed your numbness/tingling  For your numbness/tingling: it is likely related to positional changes. Please keep a diary of blood sugar readings and make sure to check your sugars when you feel tingling/shaking   I have referred you to an eye doctor. If you do not hear back in 2 weeks please call and speak to our referral coordinator.   Please follow up in 1 month or sooner if symptoms persist or worsen. Please call the clinic immediately if you have any concerns.   Our clinic's number is [phone]. Please call with questions or concerns.   Please go to the emergency room if you have weakness, facial droop, chest pain, or shortness of breath   Thank you,  Oralia Manis, DO

## 2018-01-11 ENCOUNTER — Ambulatory Visit (INDEPENDENT_AMBULATORY_CARE_PROVIDER_SITE_OTHER): Payer: Medicare PPO | Admitting: Pharmacist

## 2018-01-11 ENCOUNTER — Encounter: Payer: Self-pay | Admitting: Pharmacist

## 2018-01-11 DIAGNOSIS — E1165 Type 2 diabetes mellitus with hyperglycemia: Secondary | ICD-10-CM

## 2018-01-11 DIAGNOSIS — IMO0001 Reserved for inherently not codable concepts without codable children: Secondary | ICD-10-CM

## 2018-01-11 NOTE — Progress Notes (Signed)
Patient ID: Jordan Rodriguez, male   DOB: 01/14/1951, 67 y.o.   MRN: [id number] Reviewed: Agree with Dr. Macky LowerKoval's documentation and management.

## 2018-01-11 NOTE — Assessment & Plan Note (Signed)
Diabetes longstanding since 2015 currently controlled. Patient is able to verbalize appropriate hypoglycemia management plan. Patient is adherent with medication.  -Continued basal insulin Lantus (insulin glargine) at 4 units 2x/day. -Continued  rapid insulin Novolog (insulin aspart) at 12 units/day with breakfast.  -Continued SGLT2-I Jardiance (empagliflozin) at 25mg . Counseled on sick day rules to skip dose. -Extensively discussed pathophysiology of DM, recommended lifestyle interventions, dietary effects on glycemic control -Counseled on s/sx and management of hypoglycemia -Next A1C anticipated 03/09/2018.

## 2018-01-11 NOTE — Progress Notes (Signed)
    S:     Chief Complaint  Patient presents with  . Medication Management    Diabetes    Patient arrives in good spirits and appearing well.  Presents for diabetes evaluation, education, and management at the request of Dr. Darin EngelsAbraham. Patient was referred on 11/27/2017.   Patient reports Diabetes was diagnosed in 2015.  Insurance coverage/medication affordability: [employer]  Patient reports adherence with medications.  Current diabetes medications include: Jardiance 25mg , metformin 1000mg  BID, Novolog 4U 2x/day with meals, and Lantus 12U with breakfast Current hypertension medications include: aspirin 81mg , atorvastatin 40mg   Patient denies hypoglycemic events.   Patient denies nocturia.  Patient denies neuropathy. Patient denies visual changes. Did recently see eye doctor.  Patient denies self foot exams.   O:  Physical Exam  Constitutional: He appears well-developed and well-nourished.  Vitals reviewed.  Review of Systems  All other systems reviewed and are negative.   Lab Results  Component Value Date   HGBA1C 7.6 (A) 10/16/2017   Vitals:   01/11/18 1354  BP: 120/64  Pulse: 65  SpO2: 98%    Lipid Panel     Component Value Date/Time   CHOL 67 (L) 10/16/2017 1358   TRIG 59 10/16/2017 1358   HDL 24 (L) 10/16/2017 1358   CHOLHDL 2.8 10/16/2017 1358   CHOLHDL 3.6 07/19/2013 0913   VLDL 12 07/19/2013 0913   LDLCALC 31 10/16/2017 1358   LDLDIRECT 49 12/24/2015 1154    Home fasting CBG: 120s  2 hour post-prandial/random CBG: 170s.  A/P: Diabetes longstanding since 2015 currently controlled. Patient is able to verbalize appropriate hypoglycemia management plan. Patient is adherent with medication.  -Continued basal insulin Lantus (insulin glargine) at 4 units 2x/day. -Continued  rapid insulin Novolog (insulin aspart) at 12 units/day with breakfast.  -Continued SGLT2-I Jardiance (empagliflozin) at 25mg . Counseled on sick day rules to skip dose. -Extensively  discussed pathophysiology of DM, recommended lifestyle interventions, dietary effects on glycemic control -Counseled on s/sx and management of hypoglycemia -Next A1C anticipated 03/09/2018.   ASCVD risk - primary prevention in patient with DM. Last LDL is controlled.  -Continued aspirin 81 mg  -Continued atorvastatin 40 mg.   Written patient instructions provided.  Total time in face to face counseling 25 minutes.   Follow up PCP Dr. Darin EngelsAbraham Clinic Visit in 02/2018. Belva Ageeolin Smith, PharmD Candidate and Catie Feliz Beamravis, PharmD,  PGY2 Pharmacy Resident.

## 2018-01-11 NOTE — Patient Instructions (Addendum)
It was great to see you today!   We are going to continue your current medications: metformin 1000 mg twice daily, Lantus 12 units daily, Novolog 4 units with meals, Jardiance 25 mg daily.   If you start to experience more low blood sugars, feel free to reach out to clinic.    Follow up with Dr. Darin EngelsAbraham in about 2 months.

## 2018-03-12 ENCOUNTER — Telehealth: Payer: Self-pay | Admitting: Family Medicine

## 2018-03-12 NOTE — Telephone Encounter (Signed)
Pt would like for Dr. Raymondo Band to call him to discuss some of his medications.

## 2018-03-14 NOTE — Telephone Encounter (Signed)
Patient reports being out of Jaridance X3 weeks.  He has run out of Novolog 2 days ago (take 4 units prior to meals - 2x/day).   Continues on Lantus 12 units daily (has 2 pens remaining).   Patient requested assistance.   Agreed to support him with samples - has appt with PCP 03/29/2018 Medication Samples have been provided to the patient. Provided: Drug name: Novolog       Strength: 100units/ml        Qty: 1  LOT: [id number]  Exp.Date: 08/28/2018  Dosing instructions: 4 units twice daily with meals (2x daily)  The patient has been instructed regarding the correct time, dose, and frequency of taking this medication, including desired effects and most common side effects.   Madelon Lips 9:09 AM 03/14/2018   Agreed that patient would restart Jardiance when he is paid next ~ 10 days from now.  Patient verbalized understanding to come into the office and pick up sample and restart Jardiance when able to afford.

## 2018-03-24 NOTE — Progress Notes (Signed)
   Subjective:    Patient ID: Jordan Rodriguez, male    DOB: 27-Dec-1950, 68 y.o.   MRN: [id number]   CC: Diabetes  HPI: Diabetes Fasting checks: has been out of test strips, when taking meds was 100s. When non compliant is 150s  Post prandial when not taking meds was 270s  Compliance: has been out of medications for 2-3 weeks  Diet: is watching carbs, trying not to eat starches   Exercise: walking every other day  Eye exam: had eye exam 12/2017, will send records  Foot exam: UTD, last exam 10/2017 A1C: 8.5, worsened from 7.6 on 09/2017 Symptoms: no symptoms of hypoglycemia. no symptoms of  polyuria, polydipsia. no numbness in extremities, and no foot ulcers/trauma Meds: lantus 4U bid, novolog 12U with breakfast, Jiardiance 25mg    Hyperlipidemia Meds: atorvastatin 40mg , ASA 81mg   Diet: see above Exercise: see above   Objective:  BP 120/68   Pulse 67   Temp (!) 97.4 F (36.3 C) (Oral)   Ht 5' 6.5" (1.689 m)   Wt 131 lb 3.2 oz (59.5 kg)   SpO2 98%   BMI 20.86 kg/m  Vitals and nursing note reviewed  General: well nourished, in no acute distress HEENT: normocephalic, PERRL, no scleral icterus or conjunctival pallor, no nasal discharge, moist mucous membranes Cardiac: RRR, clear S1 and S2, no murmurs, rubs, or gallops Respiratory: clear to auscultation bilaterally, no increased work of breathing Abdomen: soft, nontender, nondistended, no masses or organomegaly. Bowel sounds present Extremities: no edema or cyanosis. Warm, well perfused. Skin: warm and dry, no rashes noted Neuro: alert and oriented, no focal deficits   Assessment & Plan:    Diabetes mellitus type 2, uncontrolled, without complications Worsening glycemic control secondary to noncompliance.  Stressed importance of taking medications regularly.  Patient states that he was out of medications and did not realize he could call for refills.  Instructed patient that he should either call our office or pharmacy to  get refills when he is almost out.  Patient also expressed that previously he was having some financial trouble affording medication.  Informed patient that if this occurs he should contact us as well so that we can work on financial assistance. -continue  lantus 4U bid, novolog 12U with breakfast, Jiardiance 25mg   -Continue lifestyle modifications of healthy diet and daily exercise -Patient is sent I examined. -Refills for all medications and glucometer strips sent to pharmacy. -Follow-up in 3 months  Hyperlipidemia Recent lab work obtained.  ASCVD risk is not greater than 20% per last assessment.  We will plan to continue current regimen of aspirin and atorvastatin.  Follow-up in 1 year    Return in about 3 months (around 06/28/2018).   Oralia Manis, DO, PGY-2

## 2018-03-29 ENCOUNTER — Ambulatory Visit (INDEPENDENT_AMBULATORY_CARE_PROVIDER_SITE_OTHER): Payer: Medicare PPO | Admitting: Family Medicine

## 2018-03-29 ENCOUNTER — Other Ambulatory Visit: Payer: Self-pay

## 2018-03-29 ENCOUNTER — Encounter: Payer: Self-pay | Admitting: Family Medicine

## 2018-03-29 VITALS — BP 120/68 | HR 67 | Temp 97.4°F | Ht 66.5 in | Wt 131.2 lb

## 2018-03-29 DIAGNOSIS — E1165 Type 2 diabetes mellitus with hyperglycemia: Secondary | ICD-10-CM

## 2018-03-29 DIAGNOSIS — E7849 Other hyperlipidemia: Secondary | ICD-10-CM

## 2018-03-29 DIAGNOSIS — IMO0001 Reserved for inherently not codable concepts without codable children: Secondary | ICD-10-CM

## 2018-03-29 DIAGNOSIS — Z794 Long term (current) use of insulin: Secondary | ICD-10-CM | POA: Diagnosis not present

## 2018-03-29 LAB — POCT GLYCOSYLATED HEMOGLOBIN (HGB A1C): HBA1C, POC (CONTROLLED DIABETIC RANGE): 8.5 % — AB (ref 0.0–7.0)

## 2018-03-29 MED ORDER — ATORVASTATIN CALCIUM 40 MG PO TABS: 40.0000 mg | ORAL_TABLET | Freq: Every day | ORAL | 3 refills | Status: DC

## 2018-03-29 MED ORDER — METFORMIN HCL 1000 MG PO TABS: ORAL_TABLET | ORAL | 5 refills | Status: DC

## 2018-03-29 MED ORDER — GLUCOSE BLOOD VI STRP: ORAL_STRIP | 3 refills | Status: DC

## 2018-03-29 MED ORDER — EMPAGLIFLOZIN 25 MG PO TABS: 25.0000 mg | ORAL_TABLET | Freq: Every day | ORAL | 2 refills | Status: DC

## 2018-03-29 MED ORDER — INSULIN GLARGINE 100 UNIT/ML SOLOSTAR PEN: 12.0000 [IU] | PEN_INJECTOR | Freq: Every day | SUBCUTANEOUS | 0 refills | Status: DC

## 2018-03-29 MED ORDER — INSULIN ASPART 100 UNIT/ML FLEXPEN: 4.0000 [IU] | PEN_INJECTOR | Freq: Two times a day (BID) | SUBCUTANEOUS | 5 refills | Status: DC

## 2018-03-29 NOTE — Patient Instructions (Signed)
It was a pleasure seeing you today.   Today we discussed your diabetes  For your diabetes: please continue your current medications. Please check your sugars daily.   Please follow up in 3 months or sooner if symptoms persist or worsen. Please call the clinic immediately if you have any concerns.   Our clinic's number is [phone]. Please call with questions or concerns.   Please go to the emergency room if if your sugars are very low or very high  Thank you,  Oralia Manis, DO

## 2018-03-30 NOTE — Assessment & Plan Note (Signed)
Worsening glycemic control secondary to noncompliance.  Stressed importance of taking medications regularly.  Patient states that he was out of medications and did not realize he could call for refills.  Instructed patient that he should either call our office or pharmacy to get refills when he is almost out.  Patient also expressed that previously he was having some financial trouble affording medication.  Informed patient that if this occurs he should contact us as well so that we can work on financial assistance. -continue  lantus 4U bid, novolog 12U with breakfast, Jiardiance 25mg   -Continue lifestyle modifications of healthy diet and daily exercise -Patient is sent I examined. -Refills for all medications and glucometer strips sent to pharmacy. -Follow-up in 3 months

## 2018-03-30 NOTE — Assessment & Plan Note (Signed)
Recent lab work obtained.  ASCVD risk is not greater than 20% per last assessment.  We will plan to continue current regimen of aspirin and atorvastatin.  Follow-up in 1 year

## 2018-04-02 ENCOUNTER — Telehealth: Payer: Self-pay

## 2018-04-02 ENCOUNTER — Other Ambulatory Visit: Payer: Self-pay | Admitting: Family Medicine

## 2018-04-02 MED ORDER — GLUCOSE BLOOD VI STRP: ORAL_STRIP | 12 refills | Status: DC

## 2018-04-02 NOTE — Telephone Encounter (Signed)
Fax from pharmacy that [employer] test strips not covered.  According to our DM sheet Accu-Chek Aviva is preferred.  Ples Specter, RN [facility] RN)

## 2018-04-02 NOTE — Telephone Encounter (Signed)
RX for accu chek aviva strips sent to pharmacy  Oralia Manis, DO, PGY-2 [facility] 04/02/2018 2:11 PM

## 2018-04-03 ENCOUNTER — Telehealth: Payer: Self-pay

## 2018-04-03 NOTE — Telephone Encounter (Signed)
Please send Accu-Chek aviva test strips with specific testing directions. Can not use "use as instructed" for billing purposes.  Ples Specter, RN [facility] RN)

## 2018-04-04 ENCOUNTER — Other Ambulatory Visit: Payer: Self-pay | Admitting: Family Medicine

## 2018-04-04 MED ORDER — GLUCOSE BLOOD VI STRP: ORAL_STRIP | 12 refills | Status: DC

## 2018-04-04 NOTE — Telephone Encounter (Signed)
RX corrected and sent to pharmacy  Oralia Manis, DO, PGY-2 [facility] 04/04/2018 8:47 AM

## 2018-05-11 ENCOUNTER — Other Ambulatory Visit: Payer: Self-pay | Admitting: Family Medicine

## 2018-05-11 DIAGNOSIS — E1165 Type 2 diabetes mellitus with hyperglycemia: Principal | ICD-10-CM

## 2018-05-11 DIAGNOSIS — IMO0001 Reserved for inherently not codable concepts without codable children: Secondary | ICD-10-CM

## 2018-05-11 DIAGNOSIS — Z794 Long term (current) use of insulin: Principal | ICD-10-CM

## 2018-08-30 ENCOUNTER — Other Ambulatory Visit: Payer: Self-pay | Admitting: Family Medicine

## 2018-08-30 DIAGNOSIS — IMO0001 Reserved for inherently not codable concepts without codable children: Secondary | ICD-10-CM

## 2018-09-03 ENCOUNTER — Ambulatory Visit (INDEPENDENT_AMBULATORY_CARE_PROVIDER_SITE_OTHER): Payer: Medicare PPO

## 2018-09-03 ENCOUNTER — Other Ambulatory Visit: Payer: Self-pay

## 2018-09-03 VITALS — BP 108/58 | HR 85 | Temp 98.5°F | Ht 66.0 in | Wt 126.0 lb

## 2018-09-03 DIAGNOSIS — Z0000 Encounter for general adult medical examination without abnormal findings: Secondary | ICD-10-CM | POA: Diagnosis not present

## 2018-09-03 NOTE — Progress Notes (Addendum)
Subjective:   Jordan Rodriguez is a 68 y.o. male who presents for initial Medicare Annual preventive examination.  The patient consented to a virtual visit.   Review of Systems: Defer to PCP.  Cardiac Risk Factors include: advanced age (>62men, >4 women);diabetes mellitus     Objective:    Vitals: BP (!) 108/58   Pulse 85   Temp 98.5 F (36.9 C) (Oral)   Ht 5\' 6"  (1.676 m)   Wt 126 lb (57.2 kg)   SpO2 96%   BMI 20.34 kg/m   Body mass index is 20.34 kg/m.  Advanced Directives 09/03/2018 03/29/2018 07/03/2017 12/29/2016 12/16/2015 10/13/2015 05/21/2015  Does Patient Have a Medical Advance Directive? No No No No No No No  Would patient like information on creating a medical advance directive? Yes (MAU/Ambulatory/Procedural Areas - Information given) No - Patient declined No - Patient declined No - Patient declined No - patient declined information No - patient declined information No - patient declined information  Pre-existing out of facility DNR order (yellow form or pink MOST form) - - - - - - -    Tobacco Social History   Tobacco Use  Smoking Status Former Smoker  . Types: Cigarettes  . Quit date: 02/28/1993  . Years since quitting: 25.5  Smokeless Tobacco Never Used     Counseling given: No   Clinical Intake:  Pre-visit preparation completed: Yes  Pain Score: 0-No pain   How often do you need to have someone help you when you read instructions, pamphlets, or other written materials from your doctor or pharmacy?: 2 - Rarely What is the last grade level you completed in school?: high school  Interpreter Needed?: No   Past Medical History:  Diagnosis Date  . Diabetes mellitus without complication ([place])   . Substance abuse ([place])    hx alcohol (quit 1982) and tobacco (quit 1995)   Past Surgical History:  Procedure Laterality Date  . COLONOSCOPY  2015  . NASAL FRACTURE SURGERY     Family History  Problem Relation Age of Onset  . Diabetes Mother   .  Hypertension Mother   . Stroke Mother   . Early death Father   . Alcohol abuse Father   . Diabetes Father   . Drug abuse Sister   . Cancer Paternal Grandfather   . Diabetes Sister   . Kidney disease Sister   . Kidney disease Sister   . Thyroid disease Sister   . Hypertension Sister   . Colon cancer Neg Hx    Social History   Socioeconomic History  . Marital status: Single    Spouse name: Not on file  . Number of children: 3  . Years of education: 33  . Highest education level: High school graduate  Occupational History    Comment: Scientist, research (physical sciences)   Social Needs  . Financial resource strain: Not hard at all  . Food insecurity    Worry: Never true    Inability: Never true  . Transportation needs    Medical: No    Non-medical: No  Tobacco Use  . Smoking status: Former Smoker    Types: Cigarettes    Quit date: 02/28/1993    Years since quitting: 25.5  . Smokeless tobacco: Never Used  Substance and Sexual Activity  . Alcohol use: No    Comment: quit 1982  . Drug use: Yes    Types: Marijuana    Comment: quit 1995  . Sexual activity: Not  Currently  Lifestyle  . Physical activity    Days per week: 0 days    Minutes per session: 0 min  . Stress: Not at all  Relationships  . Social connections    Talks on phone: More than three times a week    Gets together: Three times a week    Attends religious service: 1 to 4 times per year    Active member of club or organization: No    Attends meetings of clubs or organizations: Never    Relationship status: Never married  Other Topics Concern  . Not on file  Social History Narrative   Patient lives alone. He does drive himself. He does still work at [employer] [place]   He does have close relationships with his sister, who he drives around to run errands and medical apts. Patient is close with his children, he sees them often. However, one of his sons is in prison.        Outpatient Encounter Medications  as of 09/03/2018  Medication Sig  . aspirin EC 81 MG tablet Take 1 tablet (81 mg total) by mouth daily.  Marland Kitchen. atorvastatin (LIPITOR) 40 MG tablet Take 1 tablet (40 mg total) by mouth daily.  . empagliflozin (JARDIANCE) 25 MG TABS tablet Take 25 mg by mouth daily.  Marland Kitchen. glucose blood (ACCU-CHEK AVIVA) test strip Use one strip twice daily (AM fasting and Post prandial)  . glucose blood (BAYER CONTOUR TEST) test strip Use to test blood sugar up to 4 times a day.  ICD-10 code E11.65.  Marland Kitchen. insulin aspart (NOVOLOG FLEXPEN) 100 UNIT/ML FlexPen Inject 4 Units into the skin 2 (two) times daily with a meal. Quantity sufficient for 30 days  . Insulin Pen Needle 32G X 4 MM MISC 1 Container by Does not apply route 3 (three) times daily.  Demetra Shiner. Lancet Devices (ACCU-CHEK SOFTCLIX) lancets Check blood glucose up to 3 times daily.  Marland Kitchen. LANTUS SOLOSTAR 100 UNIT/ML Solostar Pen INJECT 12 UNITS SUBCUTANEOUSLY ONCE DAILY  . metFORMIN (GLUCOPHAGE) 1000 MG tablet TAKE 1 TABLET BY MOUTH TWICE DAILY WITH MEALS   No facility-administered encounter medications on file as of 09/03/2018.     Activities of Daily Living In your present state of health, do you have any difficulty performing the following activities: 09/03/2018 09/03/2018  Hearing? N N  Vision? Malvin JohnsY Y  Comment wears reading glasses wears glasses  Difficulty concentrating or making decisions? N N  Walking or climbing stairs? N N  Dressing or bathing? N N  Doing errands, shopping? N N  Preparing Food and eating ? N N  Using the Toilet? N N  In the past six months, have you accidently leaked urine? N N  Do you have problems with loss of bowel control? N N  Managing your Medications? N N  Managing your Finances? N N  Housekeeping or managing your Housekeeping? N -  Some recent data might be hidden    Patient Care Team: Oralia ManisAbraham, Sherin, DO as PCP - General (Family Medicine)   Assessment:   This is a routine wellness examination for Jordan Rodriguez.  Exercise Activities and Dietary  recommendations Current Exercise Habits: The patient does not participate in regular exercise at present  Goals    . Exercise 3-4x per week (30 min per time)    . HEMOGLOBIN A1C < 7.0       Fall Risk Fall Risk  09/03/2018 03/29/2018 07/03/2017 12/29/2016 08/14/2013  Falls in the past year? 0  0 No No No   Is the patient's home free of loose throw rugs in walkways, pet beds, electrical cords, etc?   yes      Grab bars in the bathroom? no      Handrails on the stairs?   yes      Adequate lighting?   yes  Patient rating of health (0-10): 8  Depression Screen PHQ 2/9 Scores 09/03/2018 03/29/2018 07/03/2017 12/29/2016  PHQ - 2 Score 0 0 0 0    Cognitive Function   6CIT Screen 09/03/2018  What Year? 0 points  What month? 0 points  What time? 0 points  Count back from 20 0 points  Months in reverse 0 points  Repeat phrase 0 points  Total Score 0    Immunization History  Administered Date(s) Administered  . Influenza,inj,Quad PF,6+ Mos 02/17/2015, 11/27/2017    Screening Tests Health Maintenance  Topic Date Due  . OPHTHALMOLOGY EXAM  09/28/1961  . TETANUS/TDAP  09/29/1970  . PNA vac Low Risk Adult (1 of 2 - PCV13) 09/28/2016  . HEMOGLOBIN A1C  09/27/2018  . INFLUENZA VACCINE  09/29/2018  . FOOT EXAM  10/17/2018  . COLONOSCOPY  10/15/2023  . Hepatitis C Screening  Completed   Cancer Screenings: Lung: Low Dose CT Chest recommended if Age 32-80 years, 30 pack-year currently smoking OR have quit w/in 15years. Patient does not qualify. Colorectal: 2015, due in 2025  Additional Screenings: Hepatitis C: 2016   Plan:  Look over the Advanced Directive packet with your family. Once filled out you can bring back to our office!    I have personally reviewed and noted the following in the patient's chart:   . Medical and social history . Use of alcohol, tobacco or illicit drugs  . Current medications and supplements . Functional ability and status . Nutritional status . Physical  activity . Advanced directives . List of other physicians . Hospitalizations, surgeries, and ER visits in previous 12 months . Vitals . Screenings to include cognitive, depression, and falls . Referrals and appointments  In addition, I have reviewed and discussed with patient certain preventive protocols, quality metrics, and best practice recommendations. A written personalized care plan for preventive services as well as general preventive health recommendations were provided to patient.    Steva Coldermily P Attila Mccarthy, CMA  09/03/2018    I have reviewed this visit and agree with the documentation.   Orpah ClintonSherin Abraham, DO, PGY-3 [facility] 09/03/2018 1:29 PM

## 2018-09-03 NOTE — Patient Instructions (Addendum)
We discussed goals: Goals    . HEMOGLOBIN A1C < 7.0      We also discussed recommended health maintenance. Please call our office and schedule a visit. As discussed, you are due for your annual flu shot in August, you can get your PNA vaccine then as well.   Health Maintenance  Topic Date Due  . OPHTHALMOLOGY EXAM  09/28/1961  . TETANUS/TDAP  09/29/1970  . PNA vac Low Risk Adult (1 of 2 - PCV13) 09/28/2016  . HEMOGLOBIN A1C  09/27/2018  . INFLUENZA VACCINE  09/29/2018  . FOOT EXAM  10/17/2018  . COLONOSCOPY  10/15/2023  . Hepatitis C Screening  Completed   The schedule is not out for August, please remember to call sometime in July to schedule your diabetes follow-up with your PCP.  We also discussed lowering your A1C. Please look over the literature I have provided for you. Consider starting to walk up to 30 minutes 3-4 times a week. This will help lower your A1C.   Bring the completed advanced directive packet with you to your next office visit.  Take Care!     Diet Recommendations for Diabetes   1. Eat at least 3 meals and 1-2 snacks per day. Never go more than 4-5 hours while awake without eating. Eat breakfast within the first hour of getting up.   2. Limit starchy foods to TWO per meal and ONE per snack. ONE portion of a starchy  food is equal to the following:   - ONE slice of bread (or its equivalent, such as half of a hamburger bun).   - 1/2 cup of a "scoopable" starchy food such as potatoes or rice.   - 15 grams of Total Carbohydrate as shown on food label.  3. Include at every meal: a protein food, a carb food, and vegetables and/or fruit.   - Obtain twice the volume of vegetables as protein or carbohydrate foods for both lunch and dinner.   - Fresh or frozen vegetables are best.   - Keep frozen vegetables on hand for a quick vegetable serving.       Starchy (carb) foods: Bread, rice, pasta, potatoes, corn, cereal, grits, crackers, bagels, muffins, all baked  goods.  (Fruits, milk, and yogurt also have carbohydrate, but most of these foods will not spike your blood sugar as most starchy foods will.)  A few fruits do cause high blood sugars; use small portions of bananas (limit to 1/2 at a time), grapes, watermelon, oranges, and most tropical fruits.    Protein foods: Meat, fish, poultry, eggs, dairy foods, and beans such as pinto and kidney beans (beans also provide carbohydrate).     WALKING  Walking is a great form of exercise to increase your strength, endurance and overall fitness.  A walking program can help you start slowly and gradually build endurance as you go.  Everyone's ability is different, so each person's starting point will be different.  You do not have to follow them exactly.  The are just samples. You should simply find out what's right for you and stick to that program.   In the beginning, you'll start off walking 2-3 times a day for short distances.  As you get stronger, you'll be walking further at just 1-2 times per day.  A. You Can Walk For A Certain Length Of Time Each Day    Walk 5 minutes 3 times per day.  Increase 2 minutes every 2 days (3 times per day).  Work up to 25-30 minutes (1-2 times per day).   Example:   Day 1-2 5 minutes 3 times per day   Day 7-8 12 minutes 2-3 times per day   Day 13-14 25 minutes 1-2 times per day  B. You Can Walk For a Certain Distance Each Day     Distance can be substituted for time.    Example:   3 trips to mailbox (at road)   3 trips to corner of block   3 trips around the block   Preventive Care 65 Years and Older, Male Preventive care refers to lifestyle choices and visits with your health care provider that can promote health and wellness. This includes:  A yearly physical exam. This is also called an annual well check.  Regular dental and eye exams.  Immunizations.  Screening for certain conditions.  Healthy lifestyle choices, such as diet and exercise. What can I  expect for my preventive care visit? Physical exam Your health care provider will check:  Height and weight. These may be used to calculate body mass index (BMI), which is a measurement that tells if you are at a healthy weight.  Heart rate and blood pressure.  Your skin for abnormal spots. Counseling Your health care provider may ask you questions about:  Alcohol, tobacco, and drug use.  Emotional well-being.  Home and relationship well-being.  Sexual activity.  Eating habits.  History of falls.  Memory and ability to understand (cognition).  Work and work Statistician. What immunizations do I need?  Influenza (flu) vaccine  This is recommended every year. Tetanus, diphtheria, and pertussis (Tdap) vaccine  You may need a Td booster every 10 years. Varicella (chickenpox) vaccine  You may need this vaccine if you have not already been vaccinated. Zoster (shingles) vaccine  You may need this after age 44. Pneumococcal conjugate (PCV13) vaccine  One dose is recommended after age 26. Pneumococcal polysaccharide (PPSV23) vaccine  One dose is recommended after age 80. Measles, mumps, and rubella (MMR) vaccine  You may need at least one dose of MMR if you were born in 1957 or later. You may also need a second dose. Meningococcal conjugate (MenACWY) vaccine  You may need this if you have certain conditions. Hepatitis A vaccine  You may need this if you have certain conditions or if you travel or work in places where you may be exposed to hepatitis A. Hepatitis B vaccine  You may need this if you have certain conditions or if you travel or work in places where you may be exposed to hepatitis B. Haemophilus influenzae type b (Hib) vaccine  You may need this if you have certain conditions. You may receive vaccines as individual doses or as more than one vaccine together in one shot (combination vaccines). Talk with your health care provider about the risks and  benefits of combination vaccines. What tests do I need? Blood tests  Lipid and cholesterol levels. These may be checked every 5 years, or more frequently depending on your overall health.  Hepatitis C test.  Hepatitis B test. Screening  Lung cancer screening. You may have this screening every year starting at age 40 if you have a 30-pack-year history of smoking and currently smoke or have quit within the past 15 years.  Colorectal cancer screening. All adults should have this screening starting at age 62 and continuing until age 86. Your health care provider may recommend screening at age 6 if you are at increased risk. You will  have tests every 1-10 years, depending on your results and the type of screening test.  Prostate cancer screening. Recommendations will vary depending on your family history and other risks.  Diabetes screening. This is done by checking your blood sugar (glucose) after you have not eaten for a while (fasting). You may have this done every 1-3 years.  Abdominal aortic aneurysm (AAA) screening. You may need this if you are a current or former smoker.  Sexually transmitted disease (STD) testing. Follow these instructions at home: Eating and drinking  Eat a diet that includes fresh fruits and vegetables, whole grains, lean protein, and low-fat dairy products. Limit your intake of foods with high amounts of sugar, saturated fats, and salt.  Take vitamin and mineral supplements as recommended by your health care provider.  Do not drink alcohol if your health care provider tells you not to drink.  If you drink alcohol: ? Limit how much you have to 0-2 drinks a day. ? Be aware of how much alcohol is in your drink. In the [place] one drink equals one 12 oz bottle of beer (355 mL), one 5 oz glass of wine (148 mL), or one 1 oz glass of hard liquor (44 mL). Lifestyle  Take daily care of your teeth and gums.  Stay active. Exercise for at least 30 minutes on 5 or more  days each week.  Do not use any products that contain nicotine or tobacco, such as cigarettes, e-cigarettes, and chewing tobacco. If you need help quitting, ask your health care provider.  If you are sexually active, practice safe sex. Use a condom or other form of protection to prevent STIs (sexually transmitted infections).  Talk with your health care provider about taking a low-dose aspirin or statin. What's next?  Visit your health care provider once a year for a well check visit.  Ask your health care provider how often you should have your eyes and teeth checked.  Stay up to date on all vaccines. This information is not intended to replace advice given to you by your health care provider. Make sure you discuss any questions you have with your health care provider. Document Released: 03/13/2015 Document Revised: 02/08/2018 Document Reviewed: 02/08/2018 Elsevier Patient Education  2020 [facility]'s number is [phone]. Please call with questions or concerns about what we discussed today.

## 2018-10-09 NOTE — Progress Notes (Signed)
Subjective:    Patient ID: Jordan Rodriguez, male    DOB: 1950/11/04, 68 y.o.   MRN: [id number]   CC: Diabetes   HPI: Diabetes Fasting checks: 100s Post prandial 116, can be lower if he doesn't eat  Compliance: good  Diet: tries to eat right, but does skip meals if he isn't home. Can eat bigger portions on occasion   Exercise: not much  Eye exam: last November  Foot exam: due   A1C: 7.8 Symptoms: yes symptoms of hypoglycemia. no symptoms of  polyuria, polydipsia. no numbness in extremities, and no foot ulcers/trauma Meds: lantus 12U, novolog 4U bid, metformin 1000mg  bid, Jiardiance 25mg   Pneumonia vaccine: due, patient refused   Urine micro albumin:creatine ratio: will obtain today   Monitoring Labs and Parameters Last A1C:  Lab Results  Component Value Date   HGBA1C 7.8 (A) 10/10/2018   Last Lipid:     Component Value Date/Time   CHOL 67 (L) 10/16/2017 1358   HDL 24 (L) 10/16/2017 1358   LDLDIRECT 49 12/24/2015 1154   Last Bmet  Potassium  Date Value Ref Range Status  10/16/2017 5.1 3.5 - 5.2 mmol/L Final   Sodium  Date Value Ref Range Status  10/16/2017 137 134 - 144 mmol/L Final   Creat  Date Value Ref Range Status  12/24/2015 1.21 0.70 - 1.25 mg/dL Final    Comment:      For patients > or = 67 years of age: The upper reference limit for Creatinine is approximately 13% higher for people identified as African-American.      Creatinine, Ser  Date Value Ref Range Status  10/16/2017 1.35 (H) 0.76 - 1.27 mg/dL Final       Ref. Range 10/10/2018 14:28  Albumin/Creatinine Ratio, Urine, POC Unknown 30-300 MG     Objective:  BP 132/64   Pulse 88   SpO2 98%  Vitals and nursing note reviewed  General: well nourished, in no acute distress HEENT: normocephalic, no scleral icterus or conjunctival pallor Cardiac: RRR, clear S1 and S2, no murmurs, rubs, or gallops Respiratory: clear to auscultation bilaterally, no increased work of breathing Abdomen:  soft, nontender, nondistended, no masses or organomegaly. Bowel sounds present Extremities: no edema or cyanosis. Warm, well perfused. Skin: warm and dry, no rashes noted Neuro: alert and oriented, no focal deficits   Assessment & Plan:    Diabetes mellitus type 2, uncontrolled, without complications Improved glycemic control with A1c 7.8, down from 8.5.  Patient does describe symptoms of hypoglycemia.  Given that we will decrease medications.  Will discontinue NovoLog 4 units with meals.  Advised to continue Lantus 12 units daily in the morning.  If patient continues to have low sugars will plan to discontinue Lantus.  Patient stated that he would like to keep Lantus going for now.  We will continue metformin and Jardiance.  Continue lifestyle modifications of healthy diet and daily exercise.  Patient to send ophthalmology record so we may have his diabetic eye exam.  Will perform foot exam at 1 month follow-up.  Follow-up in 1 month.  Stressed importance of routine vaccinations such as pneumonia vaccine and tetanus, patient refusing at this time but states that he is willing to rediscuss them at his follow-up visit next month.  Strict return precautions given.  Advised to call with any low sugars so that we may decrease his medications if needed.  Follow-up in 1 month.    Return in about 4 weeks (around 11/07/2018).  Caroline More, DO, PGY-3

## 2018-10-10 ENCOUNTER — Other Ambulatory Visit: Payer: Self-pay

## 2018-10-10 ENCOUNTER — Ambulatory Visit (INDEPENDENT_AMBULATORY_CARE_PROVIDER_SITE_OTHER): Payer: Medicare PPO | Admitting: Family Medicine

## 2018-10-10 ENCOUNTER — Encounter: Payer: Self-pay | Admitting: Family Medicine

## 2018-10-10 VITALS — BP 132/64 | HR 88

## 2018-10-10 DIAGNOSIS — IMO0001 Reserved for inherently not codable concepts without codable children: Secondary | ICD-10-CM

## 2018-10-10 DIAGNOSIS — E1165 Type 2 diabetes mellitus with hyperglycemia: Secondary | ICD-10-CM | POA: Diagnosis not present

## 2018-10-10 LAB — POCT GLYCOSYLATED HEMOGLOBIN (HGB A1C): HbA1c, POC (prediabetic range): 7.8 % — AB (ref 5.7–6.4)

## 2018-10-10 LAB — MICROALBUMIN / CREATININE URINE RATIO
Albumin: 80
Creatinine, Ur: 100 mg/dL

## 2018-10-10 NOTE — Patient Instructions (Signed)
  Diet Recommendations for Diabetes   Starchy (carb) foods: Bread, rice, pasta, potatoes, corn, cereal, grits, crackers, bagels, muffins, all baked goods.  (Fruits, milk, and yogurt also have carbohydrate, but most of these foods will not spike your blood sugar as the starchy foods will.)  A few fruits do cause high blood sugars; use small portions of bananas (limit to 1/2 at a time), grapes, watermelon, oranges, and most tropical fruits.    Protein foods: Meat, fish, poultry, eggs, dairy foods, and beans such as pinto and kidney beans (beans also provide carbohydrate).   1. Eat at least 3 meals and 1-2 snacks per day. Never go more than 4-5 hours while awake without eating. Eat breakfast within the first hour of getting up.   2. Limit starchy foods to TWO per meal and ONE per snack. ONE portion of a starchy  food is equal to the following:   - ONE slice of bread (or its equivalent, such as half of a hamburger bun).   - 1/2 cup of a "scoopable" starchy food such as potatoes or rice.   - 15 grams of carbohydrate as shown on food label.  3. Include at every meal: a protein food, a carb food, and vegetables and/or fruit.   - Obtain twice the volume of veg's as protein or carbohydrate foods for both lunch and dinner.   - Fresh or frozen veg's are best.   - Keep frozen veg's on hand for a quick vegetable serving.        For your diabetes: STOP NOVOLOG. You can continue metformin, Jiardiance, and lantus 12U daily (in the morning). If your sugars are low please give [place] a call as you may need to stop lantus. Please follow up in 1 month.   You are due for your pneumonia and tetanus vaccines. Please consider getting them at your next visit.   Please have your eye doctor send [place] your records for your eye exam.   Thank you,  Dr. Tammi Klippel

## 2018-10-10 NOTE — Assessment & Plan Note (Signed)
Improved glycemic control with A1c 7.8, down from 8.5.  Patient does describe symptoms of hypoglycemia.  Given that we will decrease medications.  Will discontinue NovoLog 4 units with meals.  Advised to continue Lantus 12 units daily in the morning.  If patient continues to have low sugars will plan to discontinue Lantus.  Patient stated that he would like to keep Lantus going for now.  We will continue metformin and Jardiance.  Continue lifestyle modifications of healthy diet and daily exercise.  Patient to send ophthalmology record so we may have his diabetic eye exam.  Will perform foot exam at 1 month follow-up.  Follow-up in 1 month.  Stressed importance of routine vaccinations such as pneumonia vaccine and tetanus, patient refusing at this time but states that he is willing to rediscuss them at his follow-up visit next month.  Strict return precautions given.  Advised to call with any low sugars so that we may decrease his medications if needed.  Follow-up in 1 month.

## 2018-11-12 NOTE — Progress Notes (Signed)
   Subjective:    Patient ID: Jordan Rodriguez, male    DOB: 05/17/1950, 68 y.o.   MRN: [id number]   CC: Diabetes f/u   HPI: Diabetes Fasting checks: 70-90 Post prandial 80-90  Compliance: good  Diet: sometimes misses meals or waits to eat till later   Exercise: not much  Eye exam: scheduled for Nov  Foot exam: will perform today  A1C: 7.8 Symptoms: no symptoms of hypoglycemia. no symptoms of  polyuria, polydipsia. no numbness in extremities, and no foot ulcers/trauma Meds: lantus 12U, metformin 1000mg  bid, Jiardiance 25mg   Pneumonia vaccine: will obtain today  Urine micro albumin:creatine ratio: completed on 10/10/18  Monitoring Labs and Parameters Last A1C:  Lab Results  Component Value Date   HGBA1C 7.8 (A) 10/10/2018   Last Lipid:     Component Value Date/Time   CHOL 67 (L) 10/16/2017 1358   HDL 24 (L) 10/16/2017 1358   LDLDIRECT 49 12/24/2015 1154   Last Bmet  Potassium  Date Value Ref Range Status  10/16/2017 5.1 3.5 - 5.2 mmol/L Final   Sodium  Date Value Ref Range Status  10/16/2017 137 134 - 144 mmol/L Final   Creat  Date Value Ref Range Status  12/24/2015 1.21 0.70 - 1.25 mg/dL Final    Comment:      For patients > or = 67 years of age: The upper reference limit for Creatinine is approximately 13% higher for people identified as African-American.      Creatinine, Ser  Date Value Ref Range Status  10/16/2017 1.35 (H) 0.76 - 1.27 mg/dL Final       Objective:  BP 102/62   Pulse 78   SpO2 98%  Vitals and nursing note reviewed  General: well nourished, in no acute distress HEENT: normocephalic, no scleral icterus Neck: supple  Cardiac: RRR, clear S1 and S2, no murmurs, rubs, or gallops Respiratory: clear to auscultation bilaterally, no increased work of breathing Abdomen: soft, nontender, nondistended, no masses or organomegaly. Bowel sounds present Extremities: no edema or cyanosis. Warm, well perfused Foot: No deformities, no  ulcerations, no other skin breakdown bilaterally.  Intact to touch and monofilament testing bilaterally.  PT and DP pulses intact bilaterally. Skin: warm and dry, no rashes noted Neuro: alert and oriented, no focal deficits   Assessment & Plan:    Diabetes mellitus type 2, uncontrolled, without complications Patient appears to have low sugars between 70-90, however is asymptomatic.  This is reassuring.  Given that patient has a history of skipping meals with low sugar readings will decrease Lantus to 10 units daily.  Advised to continue taking oral medications.  Patient should continue healthy diet and daily exercise.  Stressed importance of regular meals throughout the day to decrease likelihood of hypoglycemia.  Strict return precautions given.  Follow-up in 1 month to ensure adequate glycemic control.  If continues to be low can consider discontinuing Lantus altogether.  Patient received pneumonia vaccine as well as flu vaccine today.  States that he did not want tetanus vaccine due to having too many vaccines in 1 day, will obtain at next visit.  Strict return precautions given.  Follow-up in 1 month.    Return in about 4 weeks (around 12/11/2018).   Caroline More, DO, PGY-3

## 2018-11-13 ENCOUNTER — Other Ambulatory Visit: Payer: Self-pay

## 2018-11-13 ENCOUNTER — Encounter: Payer: Self-pay | Admitting: Family Medicine

## 2018-11-13 ENCOUNTER — Ambulatory Visit (INDEPENDENT_AMBULATORY_CARE_PROVIDER_SITE_OTHER): Payer: Medicare PPO | Admitting: Family Medicine

## 2018-11-13 DIAGNOSIS — Z794 Long term (current) use of insulin: Secondary | ICD-10-CM | POA: Diagnosis not present

## 2018-11-13 DIAGNOSIS — Z23 Encounter for immunization: Secondary | ICD-10-CM | POA: Diagnosis not present

## 2018-11-13 DIAGNOSIS — E1165 Type 2 diabetes mellitus with hyperglycemia: Secondary | ICD-10-CM

## 2018-11-13 DIAGNOSIS — IMO0001 Reserved for inherently not codable concepts without codable children: Secondary | ICD-10-CM

## 2018-11-13 MED ORDER — LANTUS SOLOSTAR 100 UNIT/ML ~~LOC~~ SOPN: PEN_INJECTOR | SUBCUTANEOUS | 0 refills | Status: DC

## 2018-11-13 NOTE — Patient Instructions (Signed)
It was a pleasure seeing you today.   Today we discussed your diabetes  For your diabetes: I have decreased your lantus to 10 units. Please follow up in 1 month.   If you start having dizziness or passing out come in immediatly or go to the ER.   Please follow up in 1 month or sooner if symptoms persist or worsen. Please call the clinic immediately if you have any concerns.   Our clinic's number is [phone]. Please call with questions or concerns.   Please go to the emergency room if your sugars are low or high or you have chest pain, SOB  Thank you,  Caroline More, DO

## 2018-11-14 NOTE — Assessment & Plan Note (Signed)
Patient appears to have low sugars between 70-90, however is asymptomatic.  This is reassuring.  Given that patient has a history of skipping meals with low sugar readings will decrease Lantus to 10 units daily.  Advised to continue taking oral medications.  Patient should continue healthy diet and daily exercise.  Stressed importance of regular meals throughout the day to decrease likelihood of hypoglycemia.  Strict return precautions given.  Follow-up in 1 month to ensure adequate glycemic control.  If continues to be low can consider discontinuing Lantus altogether.  Patient received pneumonia vaccine as well as flu vaccine today.  States that he did not want tetanus vaccine due to having too many vaccines in 1 day, will obtain at next visit.  Strict return precautions given.  Follow-up in 1 month.

## 2018-12-17 NOTE — Progress Notes (Addendum)
Subjective:    Patient ID: Jordan Rodriguez, male    DOB: [birth year], 68 y.o.   MRN: [id number]   CC: Diabetes f/u   HPI: Diabetes Patient releases being seen on 9/15 for diabetes.  At that time patient was having low sugars but was asymptomatic.  Lantus was decreased to 10 units daily especially given patient's history of skipping meals.  Was discussed that if patient continued to have low sugars that we would discontinue Lantus altogether.  Fasting checks: 120s  Post prandial can range from 80s-200s.  Usually in 100s  Compliance: good  Diet: has eaten more cereal and bananas for breakfast, snacks on raisins (this is when is sugar is high). When he eats healthy his sugars are high, but once in a while will eat carbs and his sugars are high then  Exercise: walks some but "not a whole lot". Trying to motivate himself back into walking  Eye exam: has an appointment in November Foot exam: UTD (10/2018) A1C: 7.8 in August 2020 Symptoms: No symptoms of hypoglycemia. No symptoms of  polyuria, polydipsia. No numbness in extremities, and No foot ulcers/trauma Meds: lantus 10U, metformin 1000mg  bid,Jiardiance 25mg  Pneumonia vaccine: UTD per HM tab  Urine micro albumin:creatine ratio: UTD, completed 09/2018  Monitoring Labs and Parameters Last A1C:  Lab Results  Component Value Date   HGBA1C 7.8 (A) 10/10/2018   Last Lipid:     Component Value Date/Time   CHOL 67 (L) 10/16/2017 1358   HDL 24 (L) 10/16/2017 1358   LDLDIRECT 49 12/24/2015 1154   Last Bmet  Potassium  Date Value Ref Range Status  10/16/2017 5.1 3.5 - 5.2 mmol/L Final   Sodium  Date Value Ref Range Status  10/16/2017 137 134 - 144 mmol/L Final   Creat  Date Value Ref Range Status  12/24/2015 1.21 0.70 - 1.25 mg/dL Final    Comment:      For patients > or = 67 years of age: The upper reference limit for Creatinine is approximately 13% higher for people identified as African-American.      Creatinine, Ser   Date Value Ref Range Status  10/16/2017 1.35 (H) 0.76 - 1.27 mg/dL Final       Objective:  BP (!) 118/58   Pulse 83   Ht 5\' 6"  (1.676 m)   Wt 121 lb 12.8 oz (55.2 kg)   SpO2 99%   BMI 19.66 kg/m  Vitals and nursing note reviewed  General: well nourished, in no acute distress HEENT: normocephalic, no scleral icterus or conjunctival pallor, no nasal discharge, moist mucous membranes, good dentition without erythema or discharge noted in posterior oropharynx Neck: supple  Cardiac: RRR, clear S1 and S2, no murmurs, rubs, or gallops Respiratory: clear to auscultation bilaterally, no increased work of breathing Abdomen: soft, nontender, nondistended, no masses or organomegaly. Bowel sounds present Extremities: no edema or cyanosis. Warm, well perfused.   Skin: warm and dry, no rashes noted Neuro: alert and oriented, no focal deficits   Assessment & Plan:    Diabetes mellitus type 2, uncontrolled, without complications (HCC) Peers well controlled on his current regimen.  Patient is currently taking Lantus 10 units, Metformin 1000 twice daily, Jardiance 25 mg.  No further hypoglycemic episodes.  High sugar readings in the 200s only when patient is having a poor diet such as eating raisins, 44 fries, lots of bread.  Advised healthy diet and daily exercise.  Patient will get Tdap vaccine today.  Advised yearly blood work  however patient said since he is getting vaccine today he would like to wait as he is afraid of needles.  Follow-up in 2 months for diabetes checkup as well as A1c.  Strict return precautions given.  RX for TDAP given to patient to get at pharmacy   Return in about 2 months (around 02/17/2019) for Diabetes f/u .   Caroline More, DO, PGY-3

## 2018-12-18 ENCOUNTER — Other Ambulatory Visit: Payer: Self-pay

## 2018-12-18 ENCOUNTER — Encounter: Payer: Self-pay | Admitting: Family Medicine

## 2018-12-18 ENCOUNTER — Ambulatory Visit (INDEPENDENT_AMBULATORY_CARE_PROVIDER_SITE_OTHER): Payer: Medicare PPO | Admitting: Family Medicine

## 2018-12-18 VITALS — BP 118/58 | HR 83 | Ht 66.0 in | Wt 121.8 lb

## 2018-12-18 DIAGNOSIS — E119 Type 2 diabetes mellitus without complications: Secondary | ICD-10-CM | POA: Diagnosis not present

## 2018-12-18 MED ORDER — TETANUS-DIPHTH-ACELL PERTUSSIS 5-2.5-18.5 LF-MCG/0.5 IM SUSP: 0.5000 mL | Freq: Once | INTRAMUSCULAR | 0 refills | Status: AC

## 2018-12-18 NOTE — Patient Instructions (Signed)
  Diet Recommendations for Diabetes   Starchy (carb) foods: Bread, rice, pasta, potatoes, corn, cereal, grits, crackers, bagels, muffins, all baked goods.  (Fruits, milk, and yogurt also have carbohydrate, but most of these foods will not spike your blood sugar as the starchy foods will.)  A few fruits do cause high blood sugars; use small portions of bananas (limit to 1/2 at a time), grapes, watermelon, oranges, and most tropical fruits.    Protein foods: Meat, fish, poultry, eggs, dairy foods, and beans such as pinto and kidney beans (beans also provide carbohydrate).   1. Eat at least 3 meals and 1-2 snacks per day. Never go more than 4-5 hours while awake without eating. Eat breakfast within the first hour of getting up.   2. Limit starchy foods to TWO per meal and ONE per snack. ONE portion of a starchy  food is equal to the following:   - ONE slice of bread (or its equivalent, such as half of a hamburger bun).   - 1/2 cup of a "scoopable" starchy food such as potatoes or rice.   - 15 grams of carbohydrate as shown on food label.  3. Include at every meal: a protein food, a carb food, and vegetables and/or fruit.   - Obtain twice the volume of veg's as protein or carbohydrate foods for both lunch and dinner.   - Fresh or frozen veg's are best.   - Keep frozen veg's on hand for a quick vegetable serving.      

## 2018-12-18 NOTE — Assessment & Plan Note (Signed)
Peers well controlled on his current regimen.  Patient is currently taking Lantus 10 units, Metformin 1000 twice daily, Jardiance 25 mg.  No further hypoglycemic episodes.  High sugar readings in the 200s only when patient is having a poor diet such as eating raisins, [place] fries, lots of bread.  Advised healthy diet and daily exercise.  Patient will get Tdap vaccine today.  Advised yearly blood work however patient said since he is getting vaccine today he would like to wait as he is afraid of needles.  Follow-up in 2 months for diabetes checkup as well as A1c.  Strict return precautions given.

## 2018-12-18 NOTE — Addendum Note (Signed)
Addended by: Eppie Gibson on: 12/18/2018 02:22 PM   Modules accepted: Orders

## 2018-12-27 ENCOUNTER — Other Ambulatory Visit: Payer: Self-pay | Admitting: Family Medicine

## 2019-02-15 NOTE — Progress Notes (Signed)
   Subjective:    Patient ID: Jordan Rodriguez, male    DOB: 02-15-1951, 68 y.o.   MRN: [id number]   CC:  HPI: Diabetes Fasting checks: 80s-100s, sometimes 130s when he eats late  Post prandial 150-170, 221 when he had pancakes and bananas   Compliance: taking as prescribed  Diet: recently eating more carbs   Exercise: going to start daily exercise, insurance will give him a free gym membership  Eye exam: was told he had to pay $60 so deferred exam Foot exam: UTD A1C: 7.7 Symptoms: no symptoms of hypoglycemia. no symptoms of  polyuria, polydipsia. no numbness in extremities, and no foot ulcers/trauma Meds: lantus 10U, metformin 1000mg  bid,Jiardiance 25mg  Pneumonia vaccine: UTD per HM tab   Monitoring Labs and Parameters Last A1C:  Lab Results  Component Value Date   HGBA1C 7.7 (A) 02/18/2019   Last Lipid:     Component Value Date/Time   CHOL 67 (L) 10/16/2017 1358   HDL 24 (L) 10/16/2017 1358   LDLDIRECT 49 12/24/2015 1154   Last Bmet  Potassium  Date Value Ref Range Status  10/16/2017 5.1 3.5 - 5.2 mmol/L Final   Sodium  Date Value Ref Range Status  10/16/2017 137 134 - 144 mmol/L Final   Creat  Date Value Ref Range Status  12/24/2015 1.21 0.70 - 1.25 mg/dL Final    Comment:      For patients > or = 67 years of age: The upper reference limit for Creatinine is approximately 13% higher for people identified as African-American.      Creatinine, Ser  Date Value Ref Range Status  10/16/2017 1.35 (H) 0.76 - 1.27 mg/dL Final        Objective:  BP 101/75   Pulse 74   Wt 125 lb 6.4 oz (56.9 kg)   SpO2 99%   BMI 20.24 kg/m  Vitals and nursing note reviewed  General: well nourished, in no acute distress HEENT: normocephalic Neck: supple  Cardiac: RRR, clear S1 and S2, no murmurs, rubs, or gallops Respiratory: clear to auscultation bilaterally, no increased work of breathing Abdomen: soft, nontender, nondistended, no masses or organomegaly. Bowel  sounds present Extremities: no edema or cyanosis. Warm, well perfused.   Skin: warm and dry, no rashes noted Neuro: alert and oriented, no focal deficits   Assessment & Plan:    Diabetes mellitus type 2, uncontrolled, without complications ([place]) Patient with improved A1c.  Does report worsening diet, increase carbohydrates recently.  We discussed diabetic diet as well as daily exercise.  Patient's new insurance will be paying for gym membership so he plans on exercising daily.  I congratulated him on this decision and encouraged healthy diet and daily exercise.  We will continue current regimen for diabetes at this time.  Patient to check his sugars regularly and to report any hypo or hyperglycemic episodes.  Unable to get eye exam at this time as he states that his insurance will not pay for it.  He is getting a new health insurance so will go to the ophthalmologist after his insurance updates.  Follow-up in 3 months.  Healthcare maintenance Patient is unable to afford his Tdap vaccination at this time as his insurance informed her it will be $60.  Reports that he is getting new insurance soon so he plans to get this vaccination after his insurance changes.    Return in about 3 months (around 05/19/2019).   Caroline More, DO, PGY-3

## 2019-02-18 ENCOUNTER — Other Ambulatory Visit: Payer: Self-pay

## 2019-02-18 ENCOUNTER — Ambulatory Visit (INDEPENDENT_AMBULATORY_CARE_PROVIDER_SITE_OTHER): Payer: Medicare PPO | Admitting: Family Medicine

## 2019-02-18 VITALS — BP 101/75 | HR 74 | Wt 125.4 lb

## 2019-02-18 DIAGNOSIS — E119 Type 2 diabetes mellitus without complications: Secondary | ICD-10-CM | POA: Diagnosis not present

## 2019-02-18 DIAGNOSIS — Z0000 Encounter for general adult medical examination without abnormal findings: Secondary | ICD-10-CM | POA: Diagnosis not present

## 2019-02-18 LAB — POCT GLYCOSYLATED HEMOGLOBIN (HGB A1C): HbA1c, POC (controlled diabetic range): 7.7 % — AB (ref 0.0–7.0)

## 2019-02-18 NOTE — Patient Instructions (Signed)
  Diet Recommendations for Diabetes   Starchy (carb) foods: Bread, rice, pasta, potatoes, corn, cereal, grits, crackers, bagels, muffins, all baked goods.  (Fruits, milk, and yogurt also have carbohydrate, but most of these foods will not spike your blood sugar as the starchy foods will.)  A few fruits do cause high blood sugars; use small portions of bananas (limit to 1/2 at a time), grapes, watermelon, oranges, and most tropical fruits.    Protein foods: Meat, fish, poultry, eggs, dairy foods, and beans such as pinto and kidney beans (beans also provide carbohydrate).   1. Eat at least 3 meals and 1-2 snacks per day. Never go more than 4-5 hours while awake without eating. Eat breakfast within the first hour of getting up.   2. Limit starchy foods to TWO per meal and ONE per snack. ONE portion of a starchy  food is equal to the following:   - ONE slice of bread (or its equivalent, such as half of a hamburger bun).   - 1/2 cup of a "scoopable" starchy food such as potatoes or rice.   - 15 grams of carbohydrate as shown on food label.  3. Include at every meal: a protein food, a carb food, and vegetables and/or fruit.   - Obtain twice the volume of veg's as protein or carbohydrate foods for both lunch and dinner.   - Fresh or frozen veg's are best.   - Keep frozen veg's on hand for a quick vegetable serving.      

## 2019-02-20 NOTE — Assessment & Plan Note (Addendum)
Patient with improved A1c.  Does report worsening diet, increase carbohydrates recently.  We discussed diabetic diet as well as daily exercise.  Patient's new insurance will be paying for gym membership so he plans on exercising daily.  I congratulated him on this decision and encouraged healthy diet and daily exercise.  We will continue current regimen for diabetes at this time.  Patient to check his sugars regularly and to report any hypo or hyperglycemic episodes.  Unable to get eye exam at this time as he states that his insurance will not pay for it.  He is getting a new health insurance so will go to the ophthalmologist after his insurance updates.  Follow-up in 3 months.

## 2019-02-20 NOTE — Assessment & Plan Note (Signed)
Patient is unable to afford his Tdap vaccination at this time as his insurance informed her it will be $60.  Reports that he is getting new insurance soon so he plans to get this vaccination after his insurance changes.

## 2019-03-28 ENCOUNTER — Other Ambulatory Visit: Payer: Self-pay | Admitting: Family Medicine

## 2019-04-08 ENCOUNTER — Other Ambulatory Visit: Payer: Self-pay | Admitting: Family Medicine

## 2019-05-13 NOTE — Progress Notes (Signed)
    SUBJECTIVE:   CHIEF COMPLAINT / HPI:   Diabetes Fasting checks: 97 Post prandial 132, 117, 142, 239 one afternoon when he ate a lot of bananas  Compliance: good  Diet: not eating a healthy breakfast some days. Sometimes cereal and a banana, sometimes sausage and eggs, recently bagels with cream cheese  Exercise: not a whole lot; did walk 3 times Eye exam: has an appointment in April Foot exam: UTD A1C: 8.5, goal 7.5-8 Symptoms: no symptoms of hypoglycemia. no symptoms of  polyuria, polydipsia. no numbness in extremities, and no foot ulcers/trauma Meds: lantus 10U,metformin 1000mg  bid,Jiardiance 25mg  Pneumonia vaccine: UTD Urine micro albumin:creatine ratio: will obtain today  Monitoring Labs and Parameters Last A1C:  Lab Results  Component Value Date   HGBA1C 8.5 (A) 05/14/2019   Last Lipid:     Component Value Date/Time   CHOL 67 (L) 10/16/2017 1358   HDL 24 (L) 10/16/2017 1358   LDLDIRECT 49 12/24/2015 1154   Last Bmet  Potassium  Date Value Ref Range Status  10/16/2017 5.1 3.5 - 5.2 mmol/L Final   Sodium  Date Value Ref Range Status  10/16/2017 137 134 - 144 mmol/L Final   Creat  Date Value Ref Range Status  12/24/2015 1.21 0.70 - 1.25 mg/dL Final    Comment:      For patients > or = 68 years of age: The upper reference limit for Creatinine is approximately 13% higher for people identified as African-American.      Creatinine, Ser  Date Value Ref Range Status  10/16/2017 1.35 (H) 0.76 - 1.27 mg/dL Final       PERTINENT  PMH / PSH: T2DM, CKD2, anemia, HLD  OBJECTIVE:   BP 132/60   Pulse 78   Wt 119 lb 9.6 oz (54.3 kg)   SpO2 96%   BMI 19.30 kg/m   Gen: awake and alert, NAD HEENT: moist mucous membranes, EOMI, PERRL, no AV nicking  Cardio: RRR, no MRG Resp: CTAB, no wheezes, rales, or rhonchi GI: soft, non tender, non distended, bowel sounds present Ext: no edema  ASSESSMENT/PLAN:   Diabetes mellitus type 2, uncontrolled, without  complications (HCC) Patient with elevated A1c.  I anticipate this is likely due to poor diet as he has increased his carb intake.  Advised to follow diabetic diet.  Will not increase medications at this time especially given patient's history of hypoglycemia when on an insulin regimen above 10 units.  I am hopeful that with diet he can bring his A1c even lower.  Hopeful that with diet and exercise he may even get off of Lantus totally. Patient has an eye exam in 1 month so we will not place referral at this time. Microalbumin:creatine obtained. Will obtain BMP and lipid panel. Strict return precautions given.  Follow-up in 1 month.  Healthcare maintenance Rx for Tdap given to the patient may obtain at the pharmacy.  Information on coronavirus vaccine also given if patient requested instructions on how to schedule this.  Strict return precautions given.  Hyperlipidemia Lipid panel obtained     45, DO [facility]

## 2019-05-14 ENCOUNTER — Encounter: Payer: Self-pay | Admitting: Family Medicine

## 2019-05-14 ENCOUNTER — Ambulatory Visit (INDEPENDENT_AMBULATORY_CARE_PROVIDER_SITE_OTHER): Payer: Medicare Other | Admitting: Family Medicine

## 2019-05-14 ENCOUNTER — Other Ambulatory Visit: Payer: Self-pay

## 2019-05-14 VITALS — BP 132/60 | HR 78 | Wt 119.6 lb

## 2019-05-14 DIAGNOSIS — E7800 Pure hypercholesterolemia, unspecified: Secondary | ICD-10-CM | POA: Diagnosis not present

## 2019-05-14 DIAGNOSIS — Z0000 Encounter for general adult medical examination without abnormal findings: Secondary | ICD-10-CM | POA: Diagnosis not present

## 2019-05-14 DIAGNOSIS — E119 Type 2 diabetes mellitus without complications: Secondary | ICD-10-CM

## 2019-05-14 LAB — POCT GLYCOSYLATED HEMOGLOBIN (HGB A1C): HbA1c, POC (controlled diabetic range): 8.5 % — AB (ref 0.0–7.0)

## 2019-05-14 MED ORDER — TETANUS-DIPHTH-ACELL PERTUSSIS 5-2.5-18.5 LF-MCG/0.5 IM SUSP: 0.5000 mL | Freq: Once | INTRAMUSCULAR | 0 refills | Status: AC

## 2019-05-14 NOTE — Patient Instructions (Addendum)
  Diet Recommendations for Diabetes   Starchy (carb) foods: Bread, rice, pasta, potatoes, corn, cereal, grits, crackers, bagels, muffins, all baked goods.  (Fruits, milk, and yogurt also have carbohydrate, but most of these foods will not spike your blood sugar as the starchy foods will.)  A few fruits do cause high blood sugars; use small portions of bananas (limit to 1/2 at a time), grapes, watermelon, oranges, and most tropical fruits.    Protein foods: Meat, fish, poultry, eggs, dairy foods, and beans such as pinto and kidney beans (beans also provide carbohydrate).   1. Eat at least 3 meals and 1-2 snacks per day. Never go more than 4-5 hours while awake without eating. Eat breakfast within the first hour of getting up.   2. Limit starchy foods to TWO per meal and ONE per snack. ONE portion of a starchy  food is equal to the following:   - ONE slice of bread (or its equivalent, such as half of a hamburger bun).   - 1/2 cup of a "scoopable" starchy food such as potatoes or rice.   - 15 grams of carbohydrate as shown on food label.  3. Include at every meal: a protein food, a carb food, and vegetables and/or fruit.   - Obtain twice the volume of veg's as protein or carbohydrate foods for both lunch and dinner.   - Fresh or frozen veg's are best.   - Keep frozen veg's on hand for a quick vegetable serving.      If you desire assistance with the Advanced Directives form please let us know and we can help set up an appointment.    To obtain the COVID vaccine:  Mass vaccination site will open in [place] at the [employer].    Online: [email or web address] Phone: [phone]  For patients who live in [place], [facility] Department website: COVID-19 Vaccine Information  [place], [place]   Vaccinations are given by appointment only.   Patients must register on-line for an appointment to receive the vaccine. The registration link is on the  website. Appointment slots are limited and fill up quickly.   It is difficult to be certain what group is being currently vaccinated. As of 03/11/19, I believe they are vaccinating 1b, Group 1 - People 75 and older.  For patients who live in counties other than [facility], direct them to the [employer] [facility] [employer]. There, they will need to scroll down to find a specific link to their county. [place] DHHS COVID-19: Find your spot to take your shot.  Other vaccine sources   [place]: [place] has begun offering vaccines to patients. Appointment is required. For now, the few slots fill quickly and they are only vaccinating patients 75 and older. The website is [email or web address]   [facility]: [facility] is supposed to be part of the national vaccine rollout. To my knowledge, they are not yet vaccinating patients in [place]. Stay tuned.

## 2019-05-14 NOTE — Assessment & Plan Note (Addendum)
Patient with elevated A1c.  I anticipate this is likely due to poor diet as he has increased his carb intake.  Advised to follow diabetic diet.  Will not increase medications at this time especially given patient's history of hypoglycemia when on an insulin regimen above 10 units.  I am hopeful that with diet he can bring his A1c even lower.  Hopeful that with diet and exercise he may even get off of Lantus totally. Patient has an eye exam in 1 month so we will not place referral at this time. Microalbumin:creatine obtained. Will obtain BMP and lipid panel. Strict return precautions given.  Follow-up in 1 month.

## 2019-05-14 NOTE — Assessment & Plan Note (Signed)
Rx for Tdap given to the patient may obtain at the pharmacy.  Information on coronavirus vaccine also given if patient requested instructions on how to schedule this.  Strict return precautions given.

## 2019-05-14 NOTE — Assessment & Plan Note (Signed)
Lipid panel obtained 

## 2019-05-15 LAB — BASIC METABOLIC PANEL
BUN/Creatinine Ratio: 19 (ref 10–24)
BUN: 32 mg/dL — ABNORMAL HIGH (ref 8–27)
CO2: 21 mmol/L (ref 20–29)
Calcium: 9.7 mg/dL (ref 8.6–10.2)
Chloride: 100 mmol/L (ref 96–106)
Creatinine, Ser: 1.65 mg/dL — ABNORMAL HIGH (ref 0.76–1.27)
GFR calc Af Amer: 49 mL/min/{1.73_m2} — ABNORMAL LOW (ref >59)
GFR calc non Af Amer: 42 mL/min/{1.73_m2} — ABNORMAL LOW (ref >59)
Glucose: 108 mg/dL — ABNORMAL HIGH (ref 65–99)
Potassium: 5 mmol/L (ref 3.5–5.2)
Sodium: 138 mmol/L (ref 134–144)

## 2019-05-15 LAB — LIPID PANEL
Chol/HDL Ratio: 3.3 ratio (ref 0.0–5.0)
Cholesterol, Total: 79 mg/dL — ABNORMAL LOW (ref 100–199)
HDL: 24 mg/dL — ABNORMAL LOW (ref >39)
LDL Chol Calc (NIH): 34 mg/dL (ref 0–99)
Triglycerides: 111 mg/dL (ref 0–149)
VLDL Cholesterol Cal: 21 mg/dL (ref 5–40)

## 2019-05-16 LAB — MICROALBUMIN / CREATININE URINE RATIO
Creatinine, Urine: 56.1 mg/dL
Microalb/Creat Ratio: 35 mg/g creat — ABNORMAL HIGH (ref 0–29)
Microalbumin, Urine: 19.5 ug/mL

## 2019-05-17 ENCOUNTER — Telehealth: Payer: Self-pay | Admitting: Family Medicine

## 2019-05-17 NOTE — Telephone Encounter (Signed)
Patient with proteinuria on urine sample. Normally at this time we would restart lisinopril. However, patient with new AKI on BMP at last visit. Patient to return after increased hydration for repeat BMP. If repeat Cr is back to BL will plan to start lisinopril 5mg .   Patient previously on lisinopril, held in 2016 during an AKI but was not restarted.  Discussed plan with patient who is agreeable with plan. Plans to f/u for repeat BMP.   Strict return precautions given. Advised hydration at this time. Discussed the importance of diabetic glucose control to avoid kidney damage.   2017, PGY-3 [facility] 05/17/2019 10:19 AM

## 2019-06-19 ENCOUNTER — Encounter: Payer: Self-pay | Admitting: Family Medicine

## 2019-06-19 LAB — HM DIABETES EYE EXAM

## 2019-06-23 NOTE — Progress Notes (Signed)
    SUBJECTIVE:   CHIEF COMPLAINT / HPI:   Diabetes Fasting checks: low 100s. This AM 101.  Post prandial: usually 100s. Three hypoglycemic reading of 74, 75, 60 when he skipped meals Compliance: good  Diet: trying to stay on a regular diet. Trying to cook more. Doing diabetic diet we discussed at last visit.   Eye exam: provided documentation today  Foot exam: UTD A1C: 8.5 Symptoms: yes symptoms of hypoglycemia. no symptoms of  polyuria, polydipsia. no numbness in extremities, and no foot ulcers/trauma Meds: lantus 10U,metformin 1000mg  bid,Jiardiance 25mg  Pneumonia vaccine: UTD Urine micro albumin:creatine ratio: elevated at last visit, will start ACEI if BMP wnl   Monitoring Labs and Parameters Last A1C:  Lab Results  Component Value Date   HGBA1C 8.5 (A) 05/14/2019   Last Lipid:     Component Value Date/Time   CHOL 79 (L) 05/14/2019 1456   HDL 24 (L) 05/14/2019 1456   LDLDIRECT 49 12/24/2015 1154   Last Bmet  Potassium  Date Value Ref Range Status  05/14/2019 5.0 3.5 - 5.2 mmol/L Final   Sodium  Date Value Ref Range Status  05/14/2019 138 134 - 144 mmol/L Final   Creat  Date Value Ref Range Status  12/24/2015 1.21 0.70 - 1.25 mg/dL Final    Comment:      For patients > or = 68 years of age: The upper reference limit for Creatinine is approximately 13% higher for people identified as African-American.      Creatinine, Ser  Date Value Ref Range Status  05/14/2019 1.65 (H) 0.76 - 1.27 mg/dL Final       PERTINENT  PMH / PSH: T2DM, CKD2  OBJECTIVE:   BP 118/75   Pulse 78   Ht 5\' 6"  (1.676 m)   Wt 122 lb 6.4 oz (55.5 kg)   SpO2 100%   BMI 19.76 kg/m   Gen: awake and alert, NAD Cardio: RRR, no MRG Resp: CTAB, no wheezes, rales or rhonchi GI: soft, non tender, non distended, bowel sounds present Ext: no edema  ASSESSMENT/PLAN:   Diabetes mellitus type 2, uncontrolled, without complications (HCC) Patient with some hypoglycemic readings, from  not eating regular meals.  I discussed importance of regular meals especially while on insulin.  I discussed decreasing insulin but patient has chosen to instead work on his diet.  Will eat regular meals.  I did find some food insecurity on history.  Patient does get food boxes from a local church.  States he could not get the last 2 food boxes.  I offered him a food box from our clinic but he refused at this time.  I offered him CCM referral for social work but he refused at this time.  States that he will get what he needs from the food [employer] he is already a part of.  Will contact office if he needs another food box or he would like social work intervention.  I advised follow-up in 1 month for diabetes.  I advised him to check his sugars regularly and eat regular meals.  We will get repeat BMP today.  If creatinine is at baseline we will plan to start lisinopril for renal protection.  CKD stage 2 due to type 2 diabetes mellitus (HCC) AKI on last BMP.  Will recheck today.     44, DO [facility]

## 2019-06-24 ENCOUNTER — Ambulatory Visit (INDEPENDENT_AMBULATORY_CARE_PROVIDER_SITE_OTHER): Payer: Medicare Other | Admitting: Family Medicine

## 2019-06-24 ENCOUNTER — Other Ambulatory Visit: Payer: Self-pay

## 2019-06-24 VITALS — BP 118/75 | HR 78 | Ht 66.0 in | Wt 122.4 lb

## 2019-06-24 DIAGNOSIS — E1122 Type 2 diabetes mellitus with diabetic chronic kidney disease: Secondary | ICD-10-CM

## 2019-06-24 DIAGNOSIS — N182 Chronic kidney disease, stage 2 (mild): Secondary | ICD-10-CM

## 2019-06-24 DIAGNOSIS — E119 Type 2 diabetes mellitus without complications: Secondary | ICD-10-CM

## 2019-06-24 NOTE — Assessment & Plan Note (Signed)
AKI on last BMP.  Will recheck today.

## 2019-06-24 NOTE — Assessment & Plan Note (Signed)
Patient with some hypoglycemic readings, from not eating regular meals.  I discussed importance of regular meals especially while on insulin.  I discussed decreasing insulin but patient has chosen to instead work on his diet.  Will eat regular meals.  I did find some food insecurity on history.  Patient does get food boxes from a local church.  States he could not get the last 2 food boxes.  I offered him a food box from our clinic but he refused at this time.  I offered him CCM referral for social work but he refused at this time.  States that he will get what he needs from the [employer] he is already a part of.  Will contact office if he needs another food box or he would like social work intervention.  I advised follow-up in 1 month for diabetes.  I advised him to check his sugars regularly and eat regular meals.  We will get repeat BMP today.  If creatinine is at baseline we will plan to start lisinopril for renal protection.

## 2019-06-24 NOTE — Patient Instructions (Signed)
  Diet Recommendations for Diabetes   Starchy (carb) foods: Bread, rice, pasta, potatoes, corn, cereal, grits, crackers, bagels, muffins, all baked goods.  (Fruits, milk, and yogurt also have carbohydrate, but most of these foods will not spike your blood sugar as the starchy foods will.)  A few fruits do cause high blood sugars; use small portions of bananas (limit to 1/2 at a time), grapes, watermelon, oranges, and most tropical fruits.    Protein foods: Meat, fish, poultry, eggs, dairy foods, and beans such as pinto and kidney beans (beans also provide carbohydrate).   1. Eat at least 3 meals and 1-2 snacks per day. Never go more than 4-5 hours while awake without eating. Eat breakfast within the first hour of getting up.   2. Limit starchy foods to TWO per meal and ONE per snack. ONE portion of a starchy  food is equal to the following:   - ONE slice of bread (or its equivalent, such as half of a hamburger bun).   - 1/2 cup of a "scoopable" starchy food such as potatoes or rice.   - 15 grams of carbohydrate as shown on food label.  3. Include at every meal: a protein food, a carb food, and vegetables and/or fruit.   - Obtain twice the volume of veg's as protein or carbohydrate foods for both lunch and dinner.   - Fresh or frozen veg's are best.   - Keep frozen veg's on hand for a quick vegetable serving.      

## 2019-06-25 LAB — BASIC METABOLIC PANEL
BUN/Creatinine Ratio: 16 (ref 10–24)
BUN: 31 mg/dL — ABNORMAL HIGH (ref 8–27)
CO2: 22 mmol/L (ref 20–29)
Calcium: 9.1 mg/dL (ref 8.6–10.2)
Chloride: 101 mmol/L (ref 96–106)
Creatinine, Ser: 1.94 mg/dL — ABNORMAL HIGH (ref 0.76–1.27)
GFR calc Af Amer: 40 mL/min/{1.73_m2} — ABNORMAL LOW (ref >59)
GFR calc non Af Amer: 35 mL/min/{1.73_m2} — ABNORMAL LOW (ref >59)
Glucose: 221 mg/dL — ABNORMAL HIGH (ref 65–99)
Potassium: 5 mmol/L (ref 3.5–5.2)
Sodium: 138 mmol/L (ref 134–144)

## 2019-06-26 ENCOUNTER — Other Ambulatory Visit: Payer: Self-pay | Admitting: Family Medicine

## 2019-06-26 DIAGNOSIS — E118 Type 2 diabetes mellitus with unspecified complications: Secondary | ICD-10-CM

## 2019-06-26 DIAGNOSIS — N183 Chronic kidney disease, stage 3 unspecified: Secondary | ICD-10-CM | POA: Insufficient documentation

## 2019-06-26 DIAGNOSIS — N1832 Chronic kidney disease, stage 3b: Secondary | ICD-10-CM

## 2019-06-26 DIAGNOSIS — Z794 Long term (current) use of insulin: Secondary | ICD-10-CM

## 2019-06-26 MED ORDER — METFORMIN HCL 1000 MG PO TABS: 1000.0000 mg | ORAL_TABLET | Freq: Two times a day (BID) | ORAL | 0 refills | Status: DC

## 2019-06-26 MED ORDER — JARDIANCE 25 MG PO TABS: 25.0000 mg | ORAL_TABLET | Freq: Every day | ORAL | 3 refills | Status: DC

## 2019-06-26 MED ORDER — ATORVASTATIN CALCIUM 40 MG PO TABS: 40.0000 mg | ORAL_TABLET | Freq: Every day | ORAL | 3 refills | Status: DC

## 2019-06-26 MED ORDER — ASPIRIN EC 81 MG PO TBEC: 81.0000 mg | DELAYED_RELEASE_TABLET | Freq: Every day | ORAL | 3 refills | Status: DC

## 2019-06-26 MED ORDER — LANTUS SOLOSTAR 100 UNIT/ML ~~LOC~~ SOPN: PEN_INJECTOR | SUBCUTANEOUS | 0 refills | Status: DC

## 2019-06-26 NOTE — Progress Notes (Signed)
Patient with worsening renal function.  GFR is now 40.  This patient is now in CKD stage IIIb.  Even worsening renal function will refer to nephrology.  Patient is aware and agreeable with plan.  Patient has been on Jardiance for some time now so initiation was prior to worsening GFR.  Given that GFR is above 30 we will continue Jardiance unless nephrology would like to stop this.  Patient has scheduled follow-up with me next month already.  All questions answered.  Orpah Clinton, PGY-3 [facility] 06/26/2019 10:07 AM

## 2019-07-14 NOTE — Progress Notes (Signed)
    SUBJECTIVE:   CHIEF COMPLAINT / HPI:   Diabetes Fasting checks: 83, 94, 96 Post prandial 314 yesterday, but ate late hashbrowns and a banana and graham crackers, 200s, 170s, usually mid 150s Compliance: good  Diet: sometimes has high sugar meals (see example above)   Exercise: will try to increase Eye exam: UTD per patient  Foot exam: UTD  A1C: 8.5 on 05/14/19 Symptoms: no symptoms of hypoglycemia. no symptoms of  polyuria, polydipsia. no numbness in extremities, and no foot ulcers/trauma Meds: lantus 10U,metformin 1000mg  bid,Jiardiance 25mg  Pneumonia vaccine: UTD  Urine micro albumin:creatine ratio: has referral to nephrology   Monitoring Labs and Parameters Last A1C:  Lab Results  Component Value Date   HGBA1C 8.5 (A) 05/14/2019   Last Lipid:     Component Value Date/Time   CHOL 79 (L) 05/14/2019 1456   HDL 24 (L) 05/14/2019 1456   LDLDIRECT 49 12/24/2015 1154   Last Bmet  Potassium  Date Value Ref Range Status  06/24/2019 5.0 3.5 - 5.2 mmol/L Final   Sodium  Date Value Ref Range Status  06/24/2019 138 134 - 144 mmol/L Final   Creat  Date Value Ref Range Status  12/24/2015 1.21 0.70 - 1.25 mg/dL Final    Comment:      For patients > or = 68 years of age: The upper reference limit for Creatinine is approximately 13% higher for people identified as African-American.      Creatinine, Ser  Date Value Ref Range Status  06/24/2019 1.94 (H) 0.76 - 1.27 mg/dL Final       PERTINENT  PMH / PSH: CKD stage III, diabetes  OBJECTIVE:   BP 121/80   Pulse 80   Ht 5\' 6"  (1.676 m)   Wt 120 lb 12.8 oz (54.8 kg)   SpO2 100%   BMI 19.50 kg/m   Gen: awake and alert, NAD Cardio: RRR, no MRG Resp: CTAB, no wheezes, rales or rhonchi GI: soft, non tender, non distended, bowel sounds present Ext: no edema, non tender   ASSESSMENT/PLAN:   Controlled diabetes mellitus type 2 with complications (HCC) At last visit patient had hypoglycemic episode secondary to  diet, now having hypoglycemic episodes secondary to diet.  Discussed at length healthy diabetic diet.  Patient states that he will try to adhere to this.  We will continue current medications at this time.  Follow-up in 1 month for repeat A1c.  CKD (chronic kidney disease) stage 3, GFR 30-59 ml/min Has not seen nephrology yet.  Number for nephrologist provided to patient.  Advance directive discussed with patient Patient has desire to complete advanced directives but states that he would like assistance with this.  Discussed CCM referral patient is agreeable with this.  Has consented to CCM referral and expects a call.     44, DO [facility]

## 2019-07-15 ENCOUNTER — Other Ambulatory Visit: Payer: Self-pay

## 2019-07-15 ENCOUNTER — Ambulatory Visit (INDEPENDENT_AMBULATORY_CARE_PROVIDER_SITE_OTHER): Payer: Medicare Other | Admitting: Family Medicine

## 2019-07-15 ENCOUNTER — Encounter: Payer: Self-pay | Admitting: Family Medicine

## 2019-07-15 VITALS — BP 121/80 | HR 80 | Ht 66.0 in | Wt 120.8 lb

## 2019-07-15 DIAGNOSIS — E119 Type 2 diabetes mellitus without complications: Secondary | ICD-10-CM | POA: Diagnosis not present

## 2019-07-15 DIAGNOSIS — N1832 Chronic kidney disease, stage 3b: Secondary | ICD-10-CM

## 2019-07-15 DIAGNOSIS — E118 Type 2 diabetes mellitus with unspecified complications: Secondary | ICD-10-CM

## 2019-07-15 DIAGNOSIS — Z794 Long term (current) use of insulin: Secondary | ICD-10-CM

## 2019-07-15 DIAGNOSIS — Z7189 Other specified counseling: Secondary | ICD-10-CM | POA: Insufficient documentation

## 2019-07-15 NOTE — Patient Instructions (Signed)
  Diet Recommendations for Diabetes   Starchy (carb) foods: Bread, rice, pasta, potatoes, corn, cereal, grits, crackers, bagels, muffins, all baked goods.  (Fruits, milk, and yogurt also have carbohydrate, but most of these foods will not spike your blood sugar as the starchy foods will.)  A few fruits do cause high blood sugars; use small portions of bananas (limit to 1/2 at a time), grapes, watermelon, oranges, and most tropical fruits.    Protein foods: Meat, fish, poultry, eggs, dairy foods, and beans such as pinto and kidney beans (beans also provide carbohydrate).   1. Eat at least 3 meals and 1-2 snacks per day. Never go more than 4-5 hours while awake without eating. Eat breakfast within the first hour of getting up.   2. Limit starchy foods to TWO per meal and ONE per snack. ONE portion of a starchy  food is equal to the following:   - ONE slice of bread (or its equivalent, such as half of a hamburger bun).   - 1/2 cup of a "scoopable" starchy food such as potatoes or rice.   - 15 grams of carbohydrate as shown on food label.  3. Include at every meal: a protein food, a carb food, and vegetables and/or fruit.   - Obtain twice the volume of veg's as protein or carbohydrate foods for both lunch and dinner.   - Fresh or frozen veg's are best.   - Keep frozen veg's on hand for a quick vegetable serving.        The nephrologist is  [facility] [address], [place], [place] [address] [phone]

## 2019-07-15 NOTE — Assessment & Plan Note (Signed)
At last visit patient had hypoglycemic episode secondary to diet, now having hypoglycemic episodes secondary to diet.  Discussed at length healthy diabetic diet.  Patient states that he will try to adhere to this.  We will continue current medications at this time.  Follow-up in 1 month for repeat A1c.

## 2019-07-15 NOTE — Assessment & Plan Note (Signed)
Patient has desire to complete advanced directives but states that he would like assistance with this.  Discussed CCM referral patient is agreeable with this.  Has consented to CCM referral and expects a call.

## 2019-07-15 NOTE — Assessment & Plan Note (Signed)
Has not seen nephrology yet.  Number for nephrologist provided to patient.

## 2019-07-16 ENCOUNTER — Ambulatory Visit: Payer: Medicare Other | Admitting: Licensed Clinical Social Worker

## 2019-07-16 DIAGNOSIS — Z7189 Other specified counseling: Secondary | ICD-10-CM

## 2019-07-16 NOTE — Chronic Care Management (AMB) (Signed)
   Clinical Social Work  Care Management referral   07/16/2019 Name: Jordan Rodriguez MRN: [id number] DOB: 11-07-1950  Jordan Rodriguez is a 69 y.o. year old male who is a primary care patient of Oralia Manis, DO . LCSW was consulted by PCP for assistance with Advanced Directive Education.  LCSW reached out to Constance Goltz today by phone to introduce self, assess needs and offer assistance with advance directive education.   Patient agreed to services provided today, however does not require or desire additional follow up.  Review of patient status, including review of consultants reports, relevant laboratory and other test results, and collaboration with appropriate care team members and the patient's provider was performed as part of comprehensive patient evaluation and provision of care management services.   Goals Addressed            This Visit's Progress   . complete Advance Directives       CARE PLAN ENTRY (see longitudinal plan of care for additional care plan information)  Current Barriers:  . Patient does not have an Proofreader . Acknowledges deficits, education and support in order to complete this document Clinical Social Work Goal(s):  Marland Kitchen Over the next 45 days, patient will review mailed EMMI education on Advance Directive as evidenced by patient self report of review . Over the next 45 days, the patient will  completion of Advance Directive, notarize and provide a copy to provider office Interventions provided by LCSW: . A voluntary discussion about advanced care planning including importance of advanced directives, healthcare proxy and living will was discussed with the patient.  Marland Kitchen E-Mailed the patient an EMMI educational information on Advance Directives as well as reviewed Advance Directive packet he received from the [facility] office Patient Self Care Activities:  . Is able to complete documentation with assistance of his daughter . Able to identify next of  kin or Health Care Power of Attorney/Health Care Agent . Patient will review EMMI education and call LCSW with questions Initial goal documentation    Plan:  Patient does not desire follow up will call LCSW if he has questions.  [name], LCSW Chronic Care Coordination  [facility]   [phone] 2:50 PM

## 2019-07-16 NOTE — Patient Instructions (Signed)
Licensed Clinical Social Worker Visit Information [name]  it was nice speaking with you. Please call me directly if you have questions [phone] Goals we discussed today: advance directives Goals Addressed            This Visit's Progress   . complete Advance Directives       CARE PLAN ENTRY (see longitudinal plan of care for additional care plan information)  Current Barriers:  . Patient does not have an Proofreader . Acknowledges deficits, education and support in order to complete this document Clinical Social Work Goal(s):  Marland Kitchen Over the next 45 days, patient will review mailed EMMI education on Advance Directive as evidenced by patient self report of review . Over the next 45 days, the patient will  completion of Advance Directive, notarize and provide a copy to provider office Interventions provided by LCSW: . A voluntary discussion about advanced care planning including importance of advanced directives, healthcare proxy and living will was discussed with the patient.  Marland Kitchen E-Mailed the patient an EMMI educational information on Advance Directives as well as reviewed Advance Directive packet he received from the [facility] office Patient Self Care Activities:  . Is able to complete documentation with assistance of his daughter . Able to identify next of kin or Health Care Power of Attorney/Health Care Agent . Patient will review EMMI education and call LCSW with questions Initial goal documentation     Materials provided: Yes: advance directive education  Jordan Rodriguez received Care Management services today:  1. Care Management services include personalized support from designated clinical staff supervised by his physician, including individualized plan of care and coordination with other care providers 2. 24/7 contact [phone] for assistance for urgent and routine care needs. 3. Care Management are voluntary services and be declined at any time by calling the  office.  Patient  verbally agreed to assistance and services provided by embedded care coordination/care management team today.   Patient verbalizes understanding of instructions provided today.  Follow up plan: No F/U scheduled   Soundra Pilon, LCSW

## 2019-08-21 ENCOUNTER — Ambulatory Visit: Payer: Medicare Other | Admitting: Family Medicine

## 2019-09-09 ENCOUNTER — Other Ambulatory Visit: Payer: Self-pay

## 2019-09-09 ENCOUNTER — Ambulatory Visit (INDEPENDENT_AMBULATORY_CARE_PROVIDER_SITE_OTHER): Payer: Medicare Other | Admitting: Family Medicine

## 2019-09-09 ENCOUNTER — Encounter: Payer: Self-pay | Admitting: Family Medicine

## 2019-09-09 VITALS — BP 112/62 | HR 72 | Wt 122.6 lb

## 2019-09-09 DIAGNOSIS — Z794 Long term (current) use of insulin: Secondary | ICD-10-CM | POA: Diagnosis not present

## 2019-09-09 DIAGNOSIS — E7800 Pure hypercholesterolemia, unspecified: Secondary | ICD-10-CM | POA: Diagnosis not present

## 2019-09-09 DIAGNOSIS — E118 Type 2 diabetes mellitus with unspecified complications: Secondary | ICD-10-CM | POA: Diagnosis not present

## 2019-09-09 DIAGNOSIS — N1832 Chronic kidney disease, stage 3b: Secondary | ICD-10-CM

## 2019-09-09 LAB — POCT GLYCOSYLATED HEMOGLOBIN (HGB A1C): HbA1c, POC (controlled diabetic range): 7.5 % — AB (ref 0.0–7.0)

## 2019-09-09 MED ORDER — BLOOD GLUCOSE MONITOR KIT: PACK | 0 refills | Status: AC

## 2019-09-09 NOTE — Patient Instructions (Addendum)
[facility]  [phone]  Please try cutting back on lantus to 5 U and monitoring your sugar. If consistently >200, please follow up. Otherwise see you in 1 month. Keep doing the great job with exercise and diet.    COVID-19 Vaccine Information can be found at: [email or web address] For questions related to vaccine distribution or appointments, please email [email or web address] or call [phone].

## 2019-09-09 NOTE — Progress Notes (Signed)
    SUBJECTIVE:   CHIEF COMPLAINT / HPI: " Follow-up"  Jordan Rodriguez is a 69 year old gentleman presenting for diabetes follow-up.  He was last seen by his primary care provider on 5/17 with postprandial hyperglycemia.  Recommended follow-up in 1 month with A1c check at that time.  He currently takes Lantus 10 units, Metformin 1000 mg twice daily, and Jardiance 25 mg.  Last A1c 8.5 in 04/2019, A1c 7.5 today.  He is very excited because he has been increasing his physical activity and monitoring his sugar intake. Now walking on a daily basis and significantly cut back his grape juice.  Unfortunately, has had several low sugars, a few as fasting and others around 2-3 PM in the afternoon.  Ranging 60-70s.  Occasionally symptomatic, feeling a little lightheaded.  Otherwise, CBGs average around 170.  On ASA and statin.  UTD on diabetic screening exams, awaiting nephrology referral for CKD stage III and microalbuminuria.   PERTINENT  PMH / PSH: CKD stage III, type 2 diabetes, colon diverticulosis, hyperlipidemia  OBJECTIVE:   BP 112/62   Pulse 72   Wt 122 lb 9.6 oz (55.6 kg)   SpO2 98%   BMI 19.79 kg/m   General: Alert, NAD HEENT: NCAT, MMM Cardiac: RRR no m/g/r Lungs: Clear bilaterally, no increased WOB  Abdomen: soft, non-tender Msk: Moves all extremities spontaneously  Ext: Warm, dry, 2+ distal pulses, no edema   ASSESSMENT/PLAN:   Controlled diabetes mellitus type 2 with complications (HCC) A1c improved at 7.5, in the setting of recent increase physical activity and dietary changes.  However, concurrently with several hypoglycemic episodes that were fasting and postprandial.  Recommend decrease to Lantus 5U, if he continues his lifestyle regimen as it is could consider getting off Lantus entirely in the future.  Follow-up in 1 month or sooner if needed.  CKD (chronic kidney disease) stage 3, GFR 30-59 ml/min Provided with [facility] office information to establish  care.  Hyperlipidemia Primary prevention on atorvastatin, LDL at goal.    Follow-up in 1 month to check in on hypoglycemic episodes or sooner if needed.  Allayne Stack, DO [facility]

## 2019-09-10 ENCOUNTER — Ambulatory Visit: Payer: Medicare Other | Admitting: Family Medicine

## 2019-09-10 ENCOUNTER — Encounter: Payer: Self-pay | Admitting: Family Medicine

## 2019-09-10 NOTE — Assessment & Plan Note (Signed)
Primary prevention on atorvastatin, LDL at goal.

## 2019-09-10 NOTE — Assessment & Plan Note (Addendum)
A1c improved at 7.5, in the setting of recent increase physical activity and dietary changes.  However, concurrently with several hypoglycemic episodes that were fasting and postprandial.  Recommend decrease to Lantus 5U, if he continues his lifestyle regimen as it is could consider getting off Lantus entirely in the future.  Follow-up in 1 month or sooner if needed.

## 2019-09-10 NOTE — Assessment & Plan Note (Signed)
Provided with [facility] information to establish care.

## 2019-09-12 ENCOUNTER — Ambulatory Visit: Payer: Medicare Other | Attending: Internal Medicine

## 2019-09-12 DIAGNOSIS — Z23 Encounter for immunization: Secondary | ICD-10-CM

## 2019-09-12 NOTE — Progress Notes (Signed)
° °  Covid-19 Vaccination Clinic  Name:  Jordan Rodriguez    MRN: [id number] DOB: 28-Apr-1950  09/12/2019  [name] was observed post Covid-19 immunization for 15 minutes without incident. He was provided with Vaccine Information Sheet and instruction to access the V-Safe system.   [name] was instructed to call 911 with any severe reactions post vaccine:  Difficulty breathing   Swelling of face and throat   A fast heartbeat   A bad rash all over body   Dizziness and weakness   Immunizations Administered    Name Date Dose VIS Date Route   JANSSEN COVID-19 VACCINE 09/12/2019  3:08 PM 0.5 mL 04/27/2019 Intramuscular   Manufacturer: [employer]   Lot: [id number]   NDC: 50518-335-82

## 2019-09-23 ENCOUNTER — Other Ambulatory Visit: Payer: Self-pay | Admitting: Family Medicine

## 2019-09-24 DIAGNOSIS — R809 Proteinuria, unspecified: Secondary | ICD-10-CM | POA: Diagnosis not present

## 2019-09-24 DIAGNOSIS — E1122 Type 2 diabetes mellitus with diabetic chronic kidney disease: Secondary | ICD-10-CM | POA: Diagnosis not present

## 2019-09-24 DIAGNOSIS — I129 Hypertensive chronic kidney disease with stage 1 through stage 4 chronic kidney disease, or unspecified chronic kidney disease: Secondary | ICD-10-CM | POA: Diagnosis not present

## 2019-09-24 DIAGNOSIS — D631 Anemia in chronic kidney disease: Secondary | ICD-10-CM | POA: Diagnosis not present

## 2019-09-24 DIAGNOSIS — N1832 Chronic kidney disease, stage 3b: Secondary | ICD-10-CM | POA: Diagnosis not present

## 2019-09-25 ENCOUNTER — Other Ambulatory Visit: Payer: Self-pay | Admitting: Nephrology

## 2019-09-25 DIAGNOSIS — N1832 Chronic kidney disease, stage 3b: Secondary | ICD-10-CM

## 2019-10-02 ENCOUNTER — Ambulatory Visit
Admission: RE | Admit: 2019-10-02 | Discharge: 2019-10-02 | Disposition: A | Discharge status: Home / Self Care | Payer: Medicare Other | Source: Ambulatory Visit | Attending: Nephrology | Admitting: Nephrology

## 2019-10-02 DIAGNOSIS — N189 Chronic kidney disease, unspecified: Secondary | ICD-10-CM | POA: Diagnosis not present

## 2019-10-02 DIAGNOSIS — N1832 Chronic kidney disease, stage 3b: Secondary | ICD-10-CM

## 2019-10-10 ENCOUNTER — Encounter: Payer: Self-pay | Admitting: Family Medicine

## 2019-10-10 ENCOUNTER — Other Ambulatory Visit: Payer: Self-pay

## 2019-10-10 ENCOUNTER — Ambulatory Visit (INDEPENDENT_AMBULATORY_CARE_PROVIDER_SITE_OTHER): Payer: Medicare Other | Admitting: Family Medicine

## 2019-10-10 VITALS — BP 124/58 | HR 66 | Ht 66.0 in | Wt 122.2 lb

## 2019-10-10 DIAGNOSIS — Z794 Long term (current) use of insulin: Secondary | ICD-10-CM

## 2019-10-10 DIAGNOSIS — Z23 Encounter for immunization: Secondary | ICD-10-CM | POA: Diagnosis not present

## 2019-10-10 DIAGNOSIS — E118 Type 2 diabetes mellitus with unspecified complications: Secondary | ICD-10-CM

## 2019-10-10 MED ORDER — INSULIN GLARGINE 100 UNIT/ML SOLOSTAR PEN: 5.0000 [IU] | PEN_INJECTOR | SUBCUTANEOUS | 11 refills | Status: DC

## 2019-10-10 MED ORDER — TETANUS-DIPHTH-ACELL PERTUSSIS 5-2.5-18.5 LF-MCG/0.5 IM SUSP: 0.5000 mL | Freq: Once | INTRAMUSCULAR | 0 refills | Status: AC

## 2019-10-10 NOTE — Addendum Note (Signed)
Addended by: Sabino Dick on: 10/10/2019 04:10 PM   Modules accepted: Level of Service

## 2019-10-10 NOTE — Patient Instructions (Addendum)
It was wonderful to see you today.  Please bring ALL of your medications with you to every visit.   Today we talked about:  Your diabetes.  Please decrease your Lantus to 5 units daily.  This is to prevent low blood sugars.  We will plan to follow-up in 1 month to assess how your blood sugars are doing.  Please try to incorporate at least 150 minutes of exercise every week.   Thank you for choosing [facility].   Please call [phone] with any questions about today's appointment.  Please be sure to schedule follow up for 1 month diabetes check at the front  desk before you leave today.   Sabino Dick, DO PGY-1 Family Medicine

## 2019-10-10 NOTE — Assessment & Plan Note (Signed)
I reviewed his glucose monitoring device.  His 14-day average was 118, his 30-day average is 121.  His highest readings were in the mid 200s, lowest in the 70s.  Plan to decrease Lantus to 5 units daily.  He will follow up in 1 month and if blood sugars are still well controlled we can consider discontinuing Lantus altogether.  I have sent a new prescription for Lantus 5U to his pharmacy.  He has a follow-up with nephrology in 3 months, he can discuss side effect of sodium bicarbonate at that time, will not change for now.  Also discussed with patient the importance of exercising at least 150 minutes per week for 3 or more days a week.

## 2019-10-10 NOTE — Progress Notes (Signed)
    SUBJECTIVE:   CHIEF COMPLAINT / HPI:   Diabetes Mellitus  Patient states that his blood sugars have overall been well.  Has some lower readings to the 70s and can usually tell when his blood sugars are low. He has not changed his Lantus dose, he has still been taking 10 units daily. Patient has been walking less since he just recently bought a car.  He was recently seen by the nephrologists who started him on sodium bicarb 650 mg.  He does note some muscle cramping with this new medication.    PERTINENT  PMH / PSH:  Past Medical History:  Diagnosis Date  . Diabetes mellitus without complication (HCC)   . 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 COLONOSCOPY  2015  . NASAL FRACTURE SURGERY       OBJECTIVE:   BP (!) 124/58   Pulse 66   Ht 5\' 6"  (1.676 m)   Wt 122 lb 3.2 oz (55.4 kg)   SpO2 100%   BMI 19.72 kg/m   Physical Exam Constitutional:      General: He is not in acute distress.    Appearance: Normal appearance. He is normal weight. He is not toxic-appearing.  HENT:     Head: Normocephalic and atraumatic.  Cardiovascular:     Rate and Rhythm: Normal rate and regular rhythm.     Pulses: Normal pulses.     Heart sounds: Normal heart sounds. No murmur heard.   Pulmonary:     Effort: Pulmonary effort is normal. No respiratory distress.     Breath sounds: Normal breath sounds. No wheezing or rales.  Abdominal:     General: Abdomen is flat. There is no distension.     Palpations: Abdomen is soft.     Tenderness: There is no abdominal tenderness.  Neurological:     General: No focal deficit present.     Mental Status: He is alert.  Psychiatric:        Mood and Affect: Mood normal.        Behavior: Behavior normal.        Judgment: Judgment normal.     ASSESSMENT/PLAN:   Controlled diabetes mellitus type 2 with complications (HCC) I reviewed his glucose monitoring  device.  His 14-day average was 118, his 30-day average is 121.  His highest readings were in the mid 200s, lowest in the 70s.  Plan to decrease Lantus to 5 units daily.  He will follow up in 1 month and if blood sugars are still well controlled we can consider discontinuing Lantus altogether.  I have sent a new prescription for Lantus 5U to his pharmacy.  He has a follow-up with nephrology in 3 months, he can discuss side effect of sodium bicarbonate at that time, will not change for now.  Also discussed with patient the importance of exercising at least 150 minutes per week for 3 or more days a week.   Health Maintenance Sent Rx for tdap booster to patients pharmacy.   , DO [place] [facility]

## 2019-11-11 ENCOUNTER — Ambulatory Visit (INDEPENDENT_AMBULATORY_CARE_PROVIDER_SITE_OTHER): Payer: Medicare Other | Admitting: Family Medicine

## 2019-11-11 ENCOUNTER — Other Ambulatory Visit: Payer: Self-pay

## 2019-11-11 ENCOUNTER — Encounter: Payer: Self-pay | Admitting: Family Medicine

## 2019-11-11 VITALS — BP 122/72 | HR 76 | Ht 65.0 in | Wt 120.6 lb

## 2019-11-11 DIAGNOSIS — R252 Cramp and spasm: Secondary | ICD-10-CM | POA: Diagnosis not present

## 2019-11-11 DIAGNOSIS — E118 Type 2 diabetes mellitus with unspecified complications: Secondary | ICD-10-CM

## 2019-11-11 DIAGNOSIS — N529 Male erectile dysfunction, unspecified: Secondary | ICD-10-CM | POA: Diagnosis not present

## 2019-11-11 DIAGNOSIS — Z794 Long term (current) use of insulin: Secondary | ICD-10-CM | POA: Diagnosis not present

## 2019-11-11 MED ORDER — SILDENAFIL CITRATE 50 MG PO TABS: 50.0000 mg | ORAL_TABLET | Freq: Every day | ORAL | 0 refills | Status: DC | PRN

## 2019-11-11 MED ORDER — INSULIN GLARGINE 100 UNIT/ML SOLOSTAR PEN: 5.0000 [IU] | PEN_INJECTOR | SUBCUTANEOUS | 11 refills | Status: DC

## 2019-11-11 NOTE — Assessment & Plan Note (Addendum)
Plan to continue patient on 5 units of Lantus for now. Hgb A1c recheck at next visit in 1 month. Continued to encourage exercise.

## 2019-11-11 NOTE — Progress Notes (Signed)
    SUBJECTIVE:   CHIEF COMPLAINT / HPI:   Diabetes Mellitus Patient presents today for diabetes follow-up.  Has not had any symptomatic episodes of hypoglycemia since our last visit.  States he has been feeling well.  Has had some elevated readings to high 100s and 1 in the 300s in the last 2 weeks.  7-day average was 141.  14-day average was 137.  30-day average was 139.  Muscle Cramps  Patient tells me that he has been having intermittent left leg cramps.  Believes this is from a sodium bicarbonate that was prescribed by his nephrologist.  He has a follow-up with nephrology this month.  He is able to tolerate the cramps, states moving his leg around helps.  He says he has not had any cramps in the last couple days.  Erectile dysfunction Patient is interested in a prescription for Viagra.  He is wondering if his insurance will cover this.  States he does not need it now but would like it for the future.  PERTINENT  PMH / PSH:  Past Medical History:  Diagnosis Date  . Diabetes mellitus without complication (HCC)   . Substance abuse (HCC)    hx alcohol (quit 1982) and tobacco (quit 1995)    OBJECTIVE:   BP 122/72   Pulse 76   Ht 5\' 5"  (1.651 m)   Wt 54.7 kg   SpO2 98%   BMI 20.07 kg/m   General: alert, no distress, pleasant Cardio: Regular rate and rhythm, no murmurs Respiratory: Lungs clear to auscultation bilaterally Extremities: No lower leg edema bilaterally, 2+ PT pulses bilaterally  ASSESSMENT/PLAN:   Controlled diabetes mellitus type 2 with complications (HCC) Plan to continue patient on 5 units of Lantus for now. Hgb A1c recheck at next visit in 1 month. Continued to encourage exercise.   Muscle cramps Patient complains of intermittent cramps since starting sodium bicarb. Has a follow up appointment with Nephrology this month. Offered to check electrolytes at this visit or to wait until the follow up to discuss further with Nephrologist, patient choose to wait. He  states they are tolerable and I am reassured that they don't last long and he has not had any the last couple days.   Erectile dysfunction Rx for sildenafil sent to pharmacy. If insurance does not cover this, we can try to fill out a prior auth. Also shared with patient the cost with [facility]. Patient does not take nitro. Counseled on use and to seek help if erections last longer than 4 hours.     , DO [facility]

## 2019-11-11 NOTE — Assessment & Plan Note (Signed)
Patient complains of intermittent cramps since starting sodium bicarb. Has a follow up appointment with Nephrology this month. Offered to check electrolytes at this visit or to wait until the follow up to discuss further with Nephrologist, patient choose to wait. He states they are tolerable and I am reassured that they don't last long and he has not had any the last couple days.

## 2019-11-11 NOTE — Patient Instructions (Addendum)
It was wonderful to see you today.  Please bring ALL of your medications with you to every visit.   Today we talked about:  Your diabetes. Please continue to use 5U of Lantus and check your blood sugars daily. We will plan to follow up in 1 month to re-check your Hemoglobin A1c. Continue to try and incorporate more exercise as well!  Next month you can also get your Flu shot.   I will refill your Lantus and also send in a prescription for Sildenafil, let us know if there are issues with your insurance covering this medication.    Thank you for choosing [facility].   Please call [phone] with any questions about today's appointment.  Please be sure to schedule follow up for 1 month at the front  desk before you leave today.   Sabino Dick, DO PGY-1 Family Medicine

## 2019-11-11 NOTE — Assessment & Plan Note (Signed)
Rx for sildenafil sent to pharmacy. If insurance does not cover this, we can try to fill out a prior auth. Also shared with patient the cost with [facility]. Patient does not take nitro. Counseled on use and to seek help if erections last longer than 4 hours.

## 2019-11-26 DIAGNOSIS — Z7689 Persons encountering health services in other specified circumstances: Secondary | ICD-10-CM | POA: Diagnosis not present

## 2019-11-26 DIAGNOSIS — D631 Anemia in chronic kidney disease: Secondary | ICD-10-CM | POA: Diagnosis not present

## 2019-11-26 DIAGNOSIS — N1832 Chronic kidney disease, stage 3b: Secondary | ICD-10-CM | POA: Diagnosis not present

## 2019-11-26 DIAGNOSIS — R809 Proteinuria, unspecified: Secondary | ICD-10-CM | POA: Diagnosis not present

## 2019-11-26 DIAGNOSIS — I129 Hypertensive chronic kidney disease with stage 1 through stage 4 chronic kidney disease, or unspecified chronic kidney disease: Secondary | ICD-10-CM | POA: Diagnosis not present

## 2019-11-26 DIAGNOSIS — E1122 Type 2 diabetes mellitus with diabetic chronic kidney disease: Secondary | ICD-10-CM | POA: Diagnosis not present

## 2019-12-02 ENCOUNTER — Other Ambulatory Visit: Payer: Self-pay | Admitting: Family Medicine

## 2019-12-02 ENCOUNTER — Telehealth: Payer: Self-pay

## 2019-12-02 DIAGNOSIS — Z794 Long term (current) use of insulin: Secondary | ICD-10-CM

## 2019-12-02 DIAGNOSIS — E118 Type 2 diabetes mellitus with unspecified complications: Secondary | ICD-10-CM

## 2019-12-02 MED ORDER — INSULIN PEN NEEDLE 32G X 4 MM MISC: 1.0000 | Freq: Three times a day (TID) | 6 refills | Status: DC

## 2019-12-02 NOTE — Progress Notes (Signed)
Refilled Insulin Pen Needle for patient.

## 2019-12-04 ENCOUNTER — Other Ambulatory Visit: Payer: Self-pay

## 2019-12-04 MED ORDER — ACCU-CHEK AVIVA VI STRP
ORAL_STRIP | 12 refills | Status: DC
Start: 2019-12-04 — End: 2021-05-31

## 2019-12-05 NOTE — Telephone Encounter (Signed)
RX refilled by PCP.  Glennie Hawk, CMA

## 2019-12-11 ENCOUNTER — Encounter: Payer: Self-pay | Admitting: Family Medicine

## 2019-12-11 ENCOUNTER — Ambulatory Visit (INDEPENDENT_AMBULATORY_CARE_PROVIDER_SITE_OTHER): Payer: Medicare Other | Admitting: Family Medicine

## 2019-12-11 ENCOUNTER — Other Ambulatory Visit: Payer: Self-pay

## 2019-12-11 VITALS — BP 114/60 | HR 76 | Wt 117.4 lb

## 2019-12-11 DIAGNOSIS — E118 Type 2 diabetes mellitus with unspecified complications: Secondary | ICD-10-CM

## 2019-12-11 DIAGNOSIS — Z794 Long term (current) use of insulin: Secondary | ICD-10-CM

## 2019-12-11 DIAGNOSIS — Z23 Encounter for immunization: Secondary | ICD-10-CM

## 2019-12-11 LAB — POCT GLYCOSYLATED HEMOGLOBIN (HGB A1C): HbA1c, POC (controlled diabetic range): 7.9 % — AB (ref 0.0–7.0)

## 2019-12-11 MED ORDER — METFORMIN HCL 1000 MG PO TABS: 1000.0000 mg | ORAL_TABLET | Freq: Two times a day (BID) | ORAL | 3 refills | Status: DC

## 2019-12-11 NOTE — Patient Instructions (Addendum)
It was wonderful to see you today!  Please bring ALL of your medications with you to every visit.   Today we talked about:  Your diabetes. Your A1c today was 7.9, it was increased from your last A1c in July of 7.5. For now we will not make any medication adjustments. I would encourage you to try and exercise more and try and cut back on pasta and fruit. A little bit in moderation is okay.   You also received your Flu vaccine today!   Please follow up in 3 months for a repeat A1c. You can schedule an appointment sooner if you are having problems or concerns.    Thank you for choosing [facility].   Please call [phone] with any questions about today's appointment.  Please be sure to schedule follow up for 3 months at the front  desk before you leave today.   Sabino Dick, DO PGY-1 Family Medicine     Diabetes Mellitus and Nutrition, Adult When you have diabetes (diabetes mellitus), it is very important to have healthy eating habits because your blood sugar (glucose) levels are greatly affected by what you eat and drink. Eating healthy foods in the appropriate amounts, at about the same times every day, can help you:  Control your blood glucose.  Lower your risk of heart disease.  Improve your blood pressure.  Reach or maintain a healthy weight. Every person with diabetes is different, and each person has different needs for a meal plan. Your health care provider may recommend that you work with a diet and nutrition specialist (dietitian) to make a meal plan that is best for you. Your meal plan may vary depending on factors such as:  The calories you need.  The medicines you take.  Your weight.  Your blood glucose, blood pressure, and cholesterol levels.  Your activity level.  Other health conditions you have, such as heart or kidney disease. How do carbohydrates affect me? Carbohydrates, also called carbs, affect your blood glucose level more  than any other type of food. Eating carbs naturally raises the amount of glucose in your blood. Carb counting is a method for keeping track of how many carbs you eat. Counting carbs is important to keep your blood glucose at a healthy level, especially if you use insulin or take certain oral diabetes medicines. It is important to know how many carbs you can safely have in each meal. This is different for every person. Your dietitian can help you calculate how many carbs you should have at each meal and for each snack. Foods that contain carbs include:  Bread, cereal, rice, pasta, and crackers.  Potatoes and corn.  Peas, beans, and lentils.  Milk and yogurt.  Fruit and juice.  Desserts, such as cakes, cookies, ice cream, and candy. How does alcohol affect me? Alcohol can cause a sudden decrease in blood glucose (hypoglycemia), especially if you use insulin or take certain oral diabetes medicines. Hypoglycemia can be a life-threatening condition. Symptoms of hypoglycemia (sleepiness, dizziness, and confusion) are similar to symptoms of having too much alcohol. If your health care provider says that alcohol is safe for you, follow these guidelines:  Limit alcohol intake to no more than 1 drink per day for nonpregnant women and 2 drinks per day for men. One drink equals 12 oz of beer, 5 oz of wine, or 1 oz of hard liquor.  Do not drink on an empty stomach.  Keep yourself hydrated with water, diet soda,  or unsweetened iced tea.  Keep in mind that regular soda, juice, and other mixers may contain a lot of sugar and must be counted as carbs. What are tips for following this plan?  Reading food labels  Start by checking the serving size on the "Nutrition Facts" label of packaged foods and drinks. The amount of calories, carbs, fats, and other nutrients listed on the label is based on one serving of the item. Many items contain more than one serving per package.  Check the total grams (g) of  carbs in one serving. You can calculate the number of servings of carbs in one serving by dividing the total carbs by 15. For example, if a food has 30 g of total carbs, it would be equal to 2 servings of carbs.  Check the number of grams (g) of saturated and trans fats in one serving. Choose foods that have low or no amount of these fats.  Check the number of milligrams (mg) of salt (sodium) in one serving. Most people should limit total sodium intake to less than 2,300 mg per day.  Always check the nutrition information of foods labeled as "low-fat" or "nonfat". These foods may be higher in added sugar or refined carbs and should be avoided.  Talk to your dietitian to identify your daily goals for nutrients listed on the label. Shopping  Avoid buying canned, premade, or processed foods. These foods tend to be high in fat, sodium, and added sugar.  Shop around the outside edge of the grocery store. This includes fresh fruits and vegetables, bulk grains, fresh meats, and fresh dairy. Cooking  Use low-heat cooking methods, such as baking, instead of high-heat cooking methods like deep frying.  Cook using healthy oils, such as olive, canola, or sunflower oil.  Avoid cooking with butter, cream, or high-fat meats. Meal planning  Eat meals and snacks regularly, preferably at the same times every day. Avoid going long periods of time without eating.  Eat foods high in fiber, such as fresh fruits, vegetables, beans, and whole grains. Talk to your dietitian about how many servings of carbs you can eat at each meal.  Eat 4-6 ounces (oz) of lean protein each day, such as lean meat, chicken, fish, eggs, or tofu. One oz of lean protein is equal to: ? 1 oz of meat, chicken, or fish. ? 1 egg. ?  cup of tofu.  Eat some foods each day that contain healthy fats, such as avocado, nuts, seeds, and fish. Lifestyle  Check your blood glucose regularly.  Exercise regularly as told by your health care  provider. This may include: ? 150 minutes of moderate-intensity or vigorous-intensity exercise each week. This could be brisk walking, biking, or water aerobics. ? Stretching and doing strength exercises, such as yoga or weightlifting, at least 2 times a week.  Take medicines as told by your health care provider.  Do not use any products that contain nicotine or tobacco, such as cigarettes and e-cigarettes. If you need help quitting, ask your health care provider.  Work with a Veterinary surgeon or diabetes educator to identify strategies to manage stress and any emotional and social challenges. Questions to ask a health care provider  Do I need to meet with a diabetes educator?  Do I need to meet with a dietitian?  What number can I call if I have questions?  When are the best times to check my blood glucose? Where to find more information:  American Diabetes Association: diabetes.org  Academy of Nutrition and Dietetics: www.eatright.[employer] of Diabetes and Digestive and Kidney Diseases (NIH): [employer] Summary  A healthy meal plan will help you control your blood glucose and maintain a healthy lifestyle.  Working with a diet and nutrition specialist (dietitian) can help you make a meal plan that is best for you.  Keep in mind that carbohydrates (carbs) and alcohol have immediate effects on your blood glucose levels. It is important to count carbs and to use alcohol carefully. This information is not intended to replace advice given to you by your health care provider. Make sure you discuss any questions you have with your health care provider. Document Revised: 01/27/2017 Document Reviewed: 03/21/2016 Elsevier Patient Education  2020 [employer].

## 2019-12-11 NOTE — Progress Notes (Signed)
    SUBJECTIVE:   CHIEF COMPLAINT / HPI:   Diabetes follow-up  Type 2 Diabetes Patient presents today for follow-up on his diabetes.  Has not had any episodes of symptomatic hypoglycemia.  Feels well.  Patient tells me he enjoys eating fruit. He checks his blood sugars before every meal and states that for the most part they have seemed well controlled. When asked about his elevated readying to 388 one day patient reports "maybe I had too much spaghetti that day". He also admits to not exercising very frequently.  Feels unmotivated to do so and has been walking less since he bought a car. He was recently seen by his nephrologist who did not adjust his medications.  He states that everything is going well from that standpoint.    PERTINENT  PMH / PSH:  Past Medical History:  Diagnosis Date  . Diabetes mellitus without complication (HCC)   . Substance abuse (HCC)    hx alcohol (quit 1982) and tobacco (quit 1995)    OBJECTIVE:   BP 114/60   Pulse 76   Wt 117 lb 6 oz (53.2 kg)   SpO2 99%   BMI 19.53 kg/m   Physical Exam Constitutional:      General: He is not in acute distress.    Appearance: Normal appearance. He is not ill-appearing.  Cardiovascular:     Rate and Rhythm: Normal rate and regular rhythm.     Pulses: Normal pulses.     Heart sounds: Normal heart sounds. No murmur heard.   Pulmonary:     Effort: Pulmonary effort is normal. No respiratory distress.     Breath sounds: Normal breath sounds. No stridor. No wheezing, rhonchi or rales.  Musculoskeletal:        General: No swelling. Normal range of motion.     Cervical back: Neck supple.  Skin:    General: Skin is warm and dry.  Neurological:     Mental Status: He is alert.     ASSESSMENT/PLAN:   Controlled diabetes mellitus type 2 with complications [facility]) Patient with A1c of 7.9 today.  Increased from his last A1c in July of 7.5.  Upon review of his glucometer patient has had 90-day average of 133 and 30-day  average of 130.  His lowest glucose reading within the last 2 months was 86 and highest was 388.  He has not had any symptomatic hypoglycemic episodes since we decrease his Lantus from 10 units to 5 units.  Shared decision making to not increase his Lantus, patient would like to work on his diet and exercise more often.  Feels that he is realistically able to do this.  If repeat A1c in 3 months is elevated will consider increasing his Lantus back to 10 units.     Sabino Dick, DO [facility]

## 2019-12-11 NOTE — Assessment & Plan Note (Signed)
Patient with A1c of 7.9 today.  Increased from his last A1c in July of 7.5.  Upon review of his glucometer patient has had 90-day average of 133 and 30-day average of 130.  His lowest glucose reading within the last 2 months was 86 and highest was 388.  He has not had any symptomatic hypoglycemic episodes since we decrease his Lantus from 10 units to 5 units.  Shared decision making to not increase his Lantus, patient would like to work on his diet and exercise more often.  Feels that he is realistically able to do this.  If repeat A1c in 3 months is elevated will consider increasing his Lantus back to 10 units.

## 2020-02-24 DIAGNOSIS — R809 Proteinuria, unspecified: Secondary | ICD-10-CM | POA: Diagnosis not present

## 2020-02-24 DIAGNOSIS — N1832 Chronic kidney disease, stage 3b: Secondary | ICD-10-CM | POA: Diagnosis not present

## 2020-02-24 DIAGNOSIS — E1122 Type 2 diabetes mellitus with diabetic chronic kidney disease: Secondary | ICD-10-CM | POA: Diagnosis not present

## 2020-02-24 DIAGNOSIS — I129 Hypertensive chronic kidney disease with stage 1 through stage 4 chronic kidney disease, or unspecified chronic kidney disease: Secondary | ICD-10-CM | POA: Diagnosis not present

## 2020-02-24 DIAGNOSIS — D631 Anemia in chronic kidney disease: Secondary | ICD-10-CM | POA: Diagnosis not present

## 2020-03-17 ENCOUNTER — Other Ambulatory Visit: Payer: Self-pay

## 2020-03-17 ENCOUNTER — Telehealth (INDEPENDENT_AMBULATORY_CARE_PROVIDER_SITE_OTHER): Payer: Medicare Other | Admitting: Family Medicine

## 2020-03-17 DIAGNOSIS — N529 Male erectile dysfunction, unspecified: Secondary | ICD-10-CM | POA: Diagnosis not present

## 2020-03-17 DIAGNOSIS — Z794 Long term (current) use of insulin: Secondary | ICD-10-CM | POA: Diagnosis not present

## 2020-03-17 DIAGNOSIS — E118 Type 2 diabetes mellitus with unspecified complications: Secondary | ICD-10-CM | POA: Diagnosis not present

## 2020-03-17 MED ORDER — METFORMIN HCL 1000 MG PO TABS: 1000.0000 mg | ORAL_TABLET | Freq: Every day | ORAL | 3 refills | Status: DC

## 2020-03-17 MED ORDER — SILDENAFIL CITRATE 50 MG PO TABS: 50.0000 mg | ORAL_TABLET | Freq: Every day | ORAL | 0 refills | Status: DC | PRN

## 2020-03-17 MED ORDER — METFORMIN HCL ER 500 MG PO TB24: 500.0000 mg | ORAL_TABLET | Freq: Every day | ORAL | 3 refills | Status: DC

## 2020-03-17 NOTE — Progress Notes (Addendum)
    SUBJECTIVE:   CHIEF COMPLAINT / HPI:   Diabetes Follow-Up "I've been doing okay, I haven't been obedient like I am supposed to". Reports not eating properly. Has not felt motivated to exercise lately. Seen by nephrologist who prescribed Losartan. Reports no side effects from it.    No episodes of low blood sugars below 80.   CBG's have been "up and down in the 100's and 200's". Believes highest was around 250. Admits to poor diet but has been compliant with his medications.   Erectile Dysfunction Patient requests refill of Viagra.   PERTINENT  PMH / PSH:  Past Medical History:  Diagnosis Date  . Diabetes mellitus without complication ([place])   . Substance abuse ([place])    hx alcohol (quit 1982) and tobacco (quit 1995)    OBJECTIVE:   There were no vitals taken for this visit.  Telephone Visit. Unable to check home blood pressure due to cuff running out of batteries.   ASSESSMENT/PLAN:   Controlled diabetes mellitus type 2 with complications [facility]) Telephone visit with patient to discuss diabetes. CBG's seem relatively controlled in the 100-200's, though unable to fully review glucometer. No reported low blood sugars. Was recently seen by Nephrologist who started patient on Losartan. He appears to be tolerating well. His last A1c was 7.9. Due to eGFR at 44 at last nephrology visit, will decrease Metformin to $RemoveBefo'1500mg'mjWiDAagakT$  daily. Will dc Lantus. If A1c is worsening, will consider addition of GLP-1 agonist, likely Ozempic. Continued to encourage lifestyle interventions. All questions answered. Scripts sent to pharmacy. Lab appointment made for 1/28 at [facility] for recheck A1c.   Erectile dysfunction Refill of Viagra sent to pharmacy.    Sharion Settler, DO [place]    Addendum 2/2:  Telephone-only visit: 25 minutes. I connected with  Jordan Rodriguez on 04/01/20 by telemedicine application and verified that I am speaking with the correct person using two  identifiers.  I discussed the limitations of evaluation and management by telemedicine. The patient expressed understanding and agreed to proceed.  Patient location: home. Provider location: [facility] clinic.

## 2020-03-17 NOTE — Assessment & Plan Note (Signed)
Telephone visit with patient to discuss diabetes. CBG's seem relatively controlled in the 100-200's, though unable to fully review glucometer. No reported low blood sugars. Was recently seen by Nephrologist who started patient on Losartan. He appears to be tolerating well. His last A1c was 7.9. Due to eGFR at 44 at last nephrology visit, will decrease Metformin to 1525m daily. Will dc Lantus. If A1c is worsening, will consider addition of GLP-1 agonist, likely Ozempic. Continued to encourage lifestyle interventions. All questions answered. Scripts sent to pharmacy. [facility]

## 2020-03-17 NOTE — Assessment & Plan Note (Signed)
Refill of Viagra sent to pharmacy.

## 2020-03-18 NOTE — Addendum Note (Signed)
Addended by: Sabino Dick on: 03/18/2020 10:35 AM   Modules accepted: Orders

## 2020-03-19 ENCOUNTER — Other Ambulatory Visit: Payer: Self-pay | Admitting: Family Medicine

## 2020-03-19 ENCOUNTER — Telehealth: Payer: Self-pay

## 2020-03-19 DIAGNOSIS — E118 Type 2 diabetes mellitus with unspecified complications: Secondary | ICD-10-CM

## 2020-03-19 DIAGNOSIS — Z794 Long term (current) use of insulin: Secondary | ICD-10-CM

## 2020-03-19 MED ORDER — METFORMIN HCL 1000 MG PO TABS
1500.0000 mg | ORAL_TABLET | Freq: Every day | ORAL | 3 refills | Status: DC
Start: 2020-03-19 — End: 2020-03-19

## 2020-03-19 MED ORDER — METFORMIN HCL 500 MG PO TABS: 1500.0000 mg | ORAL_TABLET | Freq: Every day | ORAL | 5 refills | Status: DC

## 2020-03-19 NOTE — Telephone Encounter (Signed)
Received phone call from pharmacist regarding clarification on metformin. Pharmacy received two prescriptions for metformin on 1/18 with different dosages.   Please advise if patient should be receiving 500 mg or 1000 mg.   To PCP  Veronda Prude, RN

## 2020-03-19 NOTE — Telephone Encounter (Signed)
Discussed with Pharmacy. 1500 mg dose Metformin sent to [facility].

## 2020-03-19 NOTE — Progress Notes (Signed)
Rx 1500 mg Metformin for patient to take daily with breakfast.

## 2020-03-27 ENCOUNTER — Other Ambulatory Visit: Payer: Medicare Other

## 2020-03-27 ENCOUNTER — Other Ambulatory Visit: Payer: Self-pay

## 2020-03-27 DIAGNOSIS — Z794 Long term (current) use of insulin: Secondary | ICD-10-CM

## 2020-03-27 DIAGNOSIS — E118 Type 2 diabetes mellitus with unspecified complications: Secondary | ICD-10-CM | POA: Diagnosis not present

## 2020-03-28 LAB — HEMOGLOBIN A1C
Est. average glucose Bld gHb Est-mCnc: 226 mg/dL
Hgb A1c MFr Bld: 9.5 % — ABNORMAL HIGH (ref 4.8–5.6)

## 2020-04-02 ENCOUNTER — Other Ambulatory Visit: Payer: Self-pay | Admitting: Family Medicine

## 2020-04-02 DIAGNOSIS — E1122 Type 2 diabetes mellitus with diabetic chronic kidney disease: Secondary | ICD-10-CM

## 2020-04-02 MED ORDER — ACCU-CHEK SOFTCLIX LANCETS MISC: 12 refills | Status: AC

## 2020-04-02 MED ORDER — OZEMPIC (0.25 OR 0.5 MG/DOSE) 2 MG/1.5ML ~~LOC~~ SOPN: PEN_INJECTOR | SUBCUTANEOUS | 3 refills | Status: DC

## 2020-04-02 NOTE — Progress Notes (Signed)
Refill of lancets sent to pharmacy. Also discussed with patient about starting Ozempic 0.25 mg weekly for 4 weeks, and then increasing to 0.5 mg given increased A1c.

## 2020-04-03 ENCOUNTER — Telehealth: Payer: Self-pay

## 2020-04-03 NOTE — Telephone Encounter (Signed)
Discussed with patient about low-likelihood of pancreatitis occurring with Ozempic and also the benefits of the medication. Also discussed more common side effects such as nausea, vomiting, diarrhea and abdominal pain and to let me know if he experiences these adverse effects.

## 2020-04-03 NOTE — Telephone Encounter (Signed)
Patient calls nurse line regarding questions with ozempic. First, patient reports that he has history of pancreatitis. Patient reports that there was a warning on the instructions regarding history of pancreatitis. Patient has concerns if it is safe for him to proceed with this medication.   Patient also is wondering if he is supposed to continue taking metformin and jardiance in addition to ozempic.   Please advise,   Veronda Prude, RN

## 2020-05-19 ENCOUNTER — Telehealth: Payer: Self-pay

## 2020-05-19 NOTE — Telephone Encounter (Signed)
Called patient and discussed diabetes regimen: 1500 mg Metformin daily, 25 mg Jardiance daily and 0.5 mg Ozempic qweek. He denied abdominal pain, nausea, vomiting, dehydration or shortness of breath. Does feel that he has lost some weight, reports being 113 lbs now (was 117 lbs in October). Suspect elevated sugars are related to his diet which he states includes fruit, breads and danishes. Counseled on importance of diet and exercise in controlling his CBG's and additionally discussed potential of adding back Lantus if CBG's stay persistently elevated. Patient has appointment 3/25. Deferred any medication changes at this time as patient has follow up soon. ED precautions given.

## 2020-05-19 NOTE — Telephone Encounter (Signed)
Patient calls nurse line reporting high CBGs. Patient reports his sugars have been in the 200s and sometimes 300s. Patient could not give me exact numbers. Patient denies blurred vision, confusion, or nausea vomiting. Patient seemed confused about his current medication regime for diabetic control. Patient advised to monitor his blood sugars and ED precautions given. Patient scheduled for 3/25.

## 2020-05-22 ENCOUNTER — Ambulatory Visit (INDEPENDENT_AMBULATORY_CARE_PROVIDER_SITE_OTHER): Payer: Medicare Other | Admitting: Family Medicine

## 2020-05-22 ENCOUNTER — Other Ambulatory Visit: Payer: Self-pay

## 2020-05-22 VITALS — BP 115/65 | HR 77 | Wt 114.2 lb

## 2020-05-22 DIAGNOSIS — I739 Peripheral vascular disease, unspecified: Secondary | ICD-10-CM

## 2020-05-22 DIAGNOSIS — E118 Type 2 diabetes mellitus with unspecified complications: Secondary | ICD-10-CM

## 2020-05-22 DIAGNOSIS — N1832 Chronic kidney disease, stage 3b: Secondary | ICD-10-CM

## 2020-05-22 DIAGNOSIS — Z794 Long term (current) use of insulin: Secondary | ICD-10-CM

## 2020-05-22 MED ORDER — INSULIN GLARGINE 100 UNIT/ML SOLOSTAR PEN: 10.0000 [IU] | PEN_INJECTOR | SUBCUTANEOUS | 11 refills | Status: DC

## 2020-05-22 NOTE — Progress Notes (Signed)
    SUBJECTIVE:   CHIEF COMPLAINT / HPI:   Elevated blood sugars: Patient states he recently decreased his Metformin and stopped his Lantus in January per his doctors instructions.  At this time he also started a GLP-1 agonist.  He is also on empagliflozin.  He is currently taking Ozempic 0.5 mg weekly, Metformin 1500 mg, Jardiance.  He denies polyuria or polydipsia, abdominal pain, fevers, nausea or vomiting.   PERTINENT  PMH / PSH: Diabetes  OBJECTIVE:   BP 115/65   Pulse 77   Wt 114 lb 3.2 oz (51.8 kg)   SpO2 99%   BMI 19.00 kg/m   General: Alert and oriented.  No acute distress.  Elderly thin male, appears stated age. CV: Regular rate and rhythm, no murmurs Pulmonary: Lungs clear auscultation bilaterally Extremities: Normal DP and posterior tibialis pulses.  Normal sensation in feet.  ASSESSMENT/PLAN:   Controlled diabetes mellitus type 2 with complications (HCC) During the patient's last appointment with PCP in January his Lantus was discontinued.  He was then started on GLP-1 agonist.  Metformin was also reduced due to declining kidney function.  Patient presents with his glucometer which shows several readings in the 2-300 range with some in the 400s.  No signs of DKA today. -Restart Lantus at 10 units -Continue GLP-1.  Do not increase dose today due to patient's low weight at baseline -Continue Metformin -BMP  -follow-up with PCP in 2 to 3 weeks.     Sandre Kitty, MD [facility]

## 2020-05-22 NOTE — Patient Instructions (Signed)
It was nice to meet you today,  I would like you to start taking Lantus 10 units every morning.  If your blood sugar is ever under 100 in the morning fasting, I would like you to decrease your Lantus by 2 units to 8 units.  If your blood sugar is ever under 70 in the morning fasting, I would like you to hold your Lantus for that day.  Please call us and let us know if that occurs.  I would like you to make an appointment with your PCP, Dr. Melba Coon, in 2 to 3 weeks.  Have a great day,  [name], MD

## 2020-05-23 ENCOUNTER — Telehealth: Payer: Self-pay | Admitting: Family Medicine

## 2020-05-23 LAB — BASIC METABOLIC PANEL
BUN/Creatinine Ratio: 17 (ref 10–24)
BUN: 33 mg/dL — ABNORMAL HIGH (ref 8–27)
CO2: 17 mmol/L — ABNORMAL LOW (ref 20–29)
Calcium: 9.1 mg/dL (ref 8.6–10.2)
Chloride: 101 mmol/L (ref 96–106)
Creatinine, Ser: 1.93 mg/dL — ABNORMAL HIGH (ref 0.76–1.27)
Glucose: 193 mg/dL — ABNORMAL HIGH (ref 65–99)
Potassium: 5.4 mmol/L — ABNORMAL HIGH (ref 3.5–5.2)
Sodium: 138 mmol/L (ref 134–144)
eGFR: 37 mL/min/{1.73_m2} — ABNORMAL LOW (ref >59)

## 2020-05-23 NOTE — Telephone Encounter (Signed)
Patient's labs from yesterday showed a metabolic acidosis with a gap of 20 as well as a potassium of 5.4. blood glucose was only 193.   At our appointment patient was asymptomatic with no signs of DKA.  Spoke with Dr. Miquel Dunn and Dr. Manson Passey, who is on the inpatient attending, regarding mgmt. They recommended further evaluation in the ED.  I called pt about this and he did not want to go to the ED at this time.  He still denied symptoms.  Advised patient to stop taking his metformin and his losartan until he could be further evaluated.  I made the patient an appointment with [facility] for Monday morning with Dr. Leveda Anna.  Would repeat his labs at that time and get UA to look for ketones.  Advised pt to go to ED if he developed any polyuria/dypsia, abd pain, n/v/d.

## 2020-05-24 NOTE — Assessment & Plan Note (Signed)
During the patient's last appointment with PCP in January his Lantus was discontinued.  He was then started on GLP-1 agonist.  Metformin was also reduced due to declining kidney function.  Patient presents with his glucometer which shows several readings in the 2-300 range with some in the 400s.  No signs of DKA today. -Restart Lantus at 10 units -Continue GLP-1.  Do not increase dose today due to patient's low weight at baseline -Continue Metformin -BMP  -follow-up with PCP in 2 to 3 weeks.

## 2020-05-25 ENCOUNTER — Ambulatory Visit (INDEPENDENT_AMBULATORY_CARE_PROVIDER_SITE_OTHER): Payer: Medicare Other | Admitting: Family Medicine

## 2020-05-25 ENCOUNTER — Encounter: Payer: Self-pay | Admitting: Family Medicine

## 2020-05-25 ENCOUNTER — Other Ambulatory Visit: Payer: Self-pay

## 2020-05-25 DIAGNOSIS — E118 Type 2 diabetes mellitus with unspecified complications: Secondary | ICD-10-CM

## 2020-05-25 DIAGNOSIS — N1832 Chronic kidney disease, stage 3b: Secondary | ICD-10-CM

## 2020-05-25 DIAGNOSIS — Z794 Long term (current) use of insulin: Secondary | ICD-10-CM | POA: Diagnosis not present

## 2020-05-25 DIAGNOSIS — E875 Hyperkalemia: Secondary | ICD-10-CM

## 2020-05-25 NOTE — Patient Instructions (Addendum)
Stay off the losartan and the metformin and the Ozempic.  For now we will just treat the diabetes with lantus. Increase the lantus to 12 units every day. Your potassium was high, I going to recheck it again.  Cut back on the bananas.   Please get seen in one to two weeks.   I will call with the blood test results

## 2020-05-26 ENCOUNTER — Encounter: Payer: Self-pay | Admitting: Family Medicine

## 2020-05-26 LAB — BASIC METABOLIC PANEL
BUN/Creatinine Ratio: 17 (ref 10–24)
BUN: 30 mg/dL — ABNORMAL HIGH (ref 8–27)
CO2: 24 mmol/L (ref 20–29)
Calcium: 8.7 mg/dL (ref 8.6–10.2)
Chloride: 99 mmol/L (ref 96–106)
Creatinine, Ser: 1.77 mg/dL — ABNORMAL HIGH (ref 0.76–1.27)
Glucose: 296 mg/dL — ABNORMAL HIGH (ref 65–99)
Potassium: 5.8 mmol/L — ABNORMAL HIGH (ref 3.5–5.2)
Sodium: 135 mmol/L (ref 134–144)
eGFR: 41 mL/min/{1.73_m2} — ABNORMAL LOW (ref >59)

## 2020-05-26 NOTE — Assessment & Plan Note (Addendum)
I believe he has progressed to the insulin requiring phase and that he will need insulin from now on.   Will recheck BMP, continue to hold lantus and Ozempic.  Increase lantus to 12 units daily  FU 1-2 weeks  Likely will need his lantus increased again at that time.

## 2020-05-26 NOTE — Assessment & Plan Note (Signed)
Worse.  Called with results.  He has only been off his losartan one day.  Continue to hold losartan.  He eats lots of bananas and citrus fruits.  Told to cut back.  Recheck BMP next visit in 1-2 weeks.

## 2020-05-26 NOTE — Progress Notes (Signed)
    SUBJECTIVE:   CHIEF COMPLAINT / HPI:   Patient follow up of diabetes and hyperkalemia.  Seen 3/25 by Dr. Constance Goltz who restarted his lantus 10 units and drew a BMP.  Because of renal dysfunction and hyperkalemia on BMP, called and told to hold the metformen, losartan and Ozempic.  Returns today of recheck.    Patient feels fine.  He has been losing weight and his BMI is ~19.  His general impression is that he does better on lantus.  He brings in his glucometer.  No low readings.  Most readings below 200.      OBJECTIVE:   BP 112/60   Pulse (!) 57   Ht 5\' 5"  (1.651 m)   Wt 113 lb 9.6 oz (51.5 kg)   SpO2 97%   BMI 18.90 kg/m   Thin man. Lungs clear Cardiac RRR without m or g Ext no edema.  ASSESSMENT/PLAN:   Controlled diabetes mellitus type 2 with complications (HCC) I believe he has progressed to the insulin requiring phase and that he will need insulin from now on.   Will recheck BMP, continue to hold lantus and Ozempic.  Increase lantus to 12 units daily  FU 1-2 weeks  Likely will need his lantus increased again at that time.  Hyperkalemia Worse.  Called with results.  He has only been off his losartan one day.  Continue to hold losartan.  He eats lots of bananas and citrus fruits.  Told to cut back.  Recheck BMP next visit in 1-2 weeks.    CKD (chronic kidney disease) stage 3, GFR 30-59 ml/min Slight improvement today off losartan and metformin.  Continue to hold both.       , MD [facility]

## 2020-05-26 NOTE — Assessment & Plan Note (Signed)
Slight improvement today off losartan and metformin.  Continue to hold both.

## 2020-06-08 ENCOUNTER — Other Ambulatory Visit: Payer: Self-pay

## 2020-06-08 ENCOUNTER — Encounter: Payer: Self-pay | Admitting: Family Medicine

## 2020-06-08 ENCOUNTER — Ambulatory Visit (INDEPENDENT_AMBULATORY_CARE_PROVIDER_SITE_OTHER): Payer: Medicare Other | Admitting: Family Medicine

## 2020-06-08 VITALS — BP 115/70 | HR 63 | Ht 65.0 in | Wt 117.8 lb

## 2020-06-08 DIAGNOSIS — E1122 Type 2 diabetes mellitus with diabetic chronic kidney disease: Secondary | ICD-10-CM

## 2020-06-08 DIAGNOSIS — E7800 Pure hypercholesterolemia, unspecified: Secondary | ICD-10-CM | POA: Diagnosis not present

## 2020-06-08 DIAGNOSIS — E118 Type 2 diabetes mellitus with unspecified complications: Secondary | ICD-10-CM

## 2020-06-08 DIAGNOSIS — Z794 Long term (current) use of insulin: Secondary | ICD-10-CM

## 2020-06-08 DIAGNOSIS — E875 Hyperkalemia: Secondary | ICD-10-CM | POA: Diagnosis not present

## 2020-06-08 DIAGNOSIS — I10 Essential (primary) hypertension: Secondary | ICD-10-CM

## 2020-06-08 LAB — POCT GLYCOSYLATED HEMOGLOBIN (HGB A1C): HbA1c, POC (controlled diabetic range): 10.8 % — AB (ref 0.0–7.0)

## 2020-06-08 NOTE — Progress Notes (Signed)
    SUBJECTIVE:   CHIEF COMPLAINT / HPI:   Type 2 Diabetes Mellitus with CKD stage 3 Patient presents today for diabetes follow-up.  He was recently seen in the clinic and advised to discontinue losartan and Metformin due to high potassium.  He has also been off his Ozempic. He denies any symptoms.  He feels well.  Feels that he is felt improved since coming off of the Ozempic.  Admits to eating pastries recently and feels that his sugars have been elevated because of it.  He brings his glucometer with him.  Glucometer shows a 30-day average of 231.  Upon review of his data day sugars within the last few days they have ranged 96-460.  PERTINENT  PMH / PSH:  Past Medical History:  Diagnosis Date  . Diabetes mellitus without complication (HCC)   . Substance abuse (HCC)    hx alcohol (quit 1982) and tobacco (quit 1995)    OBJECTIVE:   BP 115/70   Pulse 63   Ht 5\' 5"  (1.651 m)   Wt 117 lb 12.8 oz (53.4 kg)   SpO2 99%   BMI 19.60 kg/m    General: NAD, pleasant, able to participate in exam Cardiac: RRR, no murmurs. Respiratory: CTAB, normal effort, No wheezes, rales or rhonchi Extremities: no edema or cyanosis.noted  ASSESSMENT/PLAN:   Controlled diabetes mellitus type 2 with complications (HCC) Hgb A1c has been gradually increasing: 7.9>9.5>10.8. He was trialed off of Lantus but he has not had good control and was experiencing side effects from Ozempic. He was recently started back on Lantus and currently taking 12U daily. Given worsening A1c and hyperglycemia with readings over 400, would like to increase Lantus to 15U today. He will likely need additional increases but would like to titrate up slowly to avoid hypoglycemic episodes (he has previously been seen in the clinic for this).  -Due for diabetic eye exam, patient will call to schedule appointment -Lipid panel today -Repeat A1c July 2022 -Increase Lantus to 15 units daily -Restart Metformin pending BMP -Continue Jardiance  25mg  daily -CCM referral today; help with chronic disease management -Continue to educate on lifestyle modifications: nutrition and exercise   Hyperkalemia Hyperkalemic 5.4> 5.8 on 2 prior visits 3/25 and 3/28.  Has been off of losartan and Metformin and educated to decrease fruit consumption.  Will recheck BMP today to ensure this is improving.  Denies any symptoms.  Given uncontrolled diabetes, would like to restart Metformin and losartan if possible.   -If BMP shows normal potassium, will restart Losartan and Metformin with close follow-up for repeat BMP.     4/25, DO [facility]

## 2020-06-08 NOTE — Assessment & Plan Note (Signed)
Hgb A1c has been gradually increasing: 7.9>9.5>10.8. He was trialed off of Lantus but he has not had good control and was experiencing side effects from Ozempic. He was recently started back on Lantus and currently taking 12U daily. Given worsening A1c and hyperglycemia with readings over 400, would like to increase Lantus to 15U today. He will likely need additional increases but would like to titrate up slowly to avoid hypoglycemic episodes (he has previously been seen in the clinic for this).  -Due for diabetic eye exam, patient will call to schedule appointment -Lipid panel today -Repeat A1c July 2022 -Increase Lantus to 15 units daily -Restart Metformin pending BMP -Continue Jardiance 25mg  daily -CCM referral today; help with chronic disease management -Continue to educate on lifestyle modifications: nutrition and exercise

## 2020-06-08 NOTE — Patient Instructions (Addendum)
It was wonderful to see you today.  Please bring ALL of your medications with you to every visit.   Today we talked about:  -Increasing your Lantus to 15U daily.  -Your A1c has gone up from 9.5 to 10.8. The goal would be to have this under 7. It is important that you continue to work on improvements in diet and incorporating exercise into your daily routine to help. We may need to continue to increase your Lantus as well.   -Today we did blood work to check your potassium level and your cholesterol, I will let you know the results.  -Please schedule an appointment with an ophthalmologist for your yearly diabetic eye examination.   Thank you for choosing [facility].   Please call [phone] with any questions about today's appointment.  Please be sure to schedule follow up at the front  desk before you leave today.   Sabino Dick, DO PGY-1 Family Medicine    Diabetes Mellitus and Nutrition, Adult When you have diabetes, or diabetes mellitus, it is very important to have healthy eating habits because your blood sugar (glucose) levels are greatly affected by what you eat and drink. Eating healthy foods in the right amounts, at about the same times every day, can help you:  Control your blood glucose.  Lower your risk of heart disease.  Improve your blood pressure.  Reach or maintain a healthy weight. What can affect my meal plan? Every person with diabetes is different, and each person has different needs for a meal plan. Your health care provider may recommend that you work with a dietitian to make a meal plan that is best for you. Your meal plan may vary depending on factors such as:  The calories you need.  The medicines you take.  Your weight.  Your blood glucose, blood pressure, and cholesterol levels.  Your activity level.  Other health conditions you have, such as heart or kidney disease. How do carbohydrates affect me? Carbohydrates,  also called carbs, affect your blood glucose level more than any other type of food. Eating carbs naturally raises the amount of glucose in your blood. Carb counting is a method for keeping track of how many carbs you eat. Counting carbs is important to keep your blood glucose at a healthy level, especially if you use insulin or take certain oral diabetes medicines. It is important to know how many carbs you can safely have in each meal. This is different for every person. Your dietitian can help you calculate how many carbs you should have at each meal and for each snack. How does alcohol affect me? Alcohol can cause a sudden decrease in blood glucose (hypoglycemia), especially if you use insulin or take certain oral diabetes medicines. Hypoglycemia can be a life-threatening condition. Symptoms of hypoglycemia, such as sleepiness, dizziness, and confusion, are similar to symptoms of having too much alcohol.  Do not drink alcohol if: ? Your health care provider tells you not to drink. ? You are pregnant, may be pregnant, or are planning to become pregnant.  If you drink alcohol: ? Do not drink on an empty stomach. ? Limit how much you use to:  0-1 drink a day for women.  0-2 drinks a day for men. ? Be aware of how much alcohol is in your drink. In the [place] one drink equals one 12 oz bottle of beer (355 mL), one 5 oz glass of wine (148 mL), or one 1 oz glass of  hard liquor (44 mL). ? Keep yourself hydrated with water, diet soda, or unsweetened iced tea.  Keep in mind that regular soda, juice, and other mixers may contain a lot of sugar and must be counted as carbs. What are tips for following this plan? Reading food labels  Start by checking the serving size on the "Nutrition Facts" label of packaged foods and drinks. The amount of calories, carbs, fats, and other nutrients listed on the label is based on one serving of the item. Many items contain more than one serving per package.  Check  the total grams (g) of carbs in one serving. You can calculate the number of servings of carbs in one serving by dividing the total carbs by 15. For example, if a food has 30 g of total carbs per serving, it would be equal to 2 servings of carbs.  Check the number of grams (g) of saturated fats and trans fats in one serving. Choose foods that have a low amount or none of these fats.  Check the number of milligrams (mg) of salt (sodium) in one serving. Most people should limit total sodium intake to less than 2,300 mg per day.  Always check the nutrition information of foods labeled as "low-fat" or "nonfat." These foods may be higher in added sugar or refined carbs and should be avoided.  Talk to your dietitian to identify your daily goals for nutrients listed on the label. Shopping  Avoid buying canned, pre-made, or processed foods. These foods tend to be high in fat, sodium, and added sugar.  Shop around the outside edge of the grocery store. This is where you will most often find fresh fruits and vegetables, bulk grains, fresh meats, and fresh dairy. Cooking  Use low-heat cooking methods, such as baking, instead of high-heat cooking methods like deep frying.  Cook using healthy oils, such as olive, canola, or sunflower oil.  Avoid cooking with butter, cream, or high-fat meats. Meal planning  Eat meals and snacks regularly, preferably at the same times every day. Avoid going long periods of time without eating.  Eat foods that are high in fiber, such as fresh fruits, vegetables, beans, and whole grains. Talk with your dietitian about how many servings of carbs you can eat at each meal.  Eat 4-6 oz (112-168 g) of lean protein each day, such as lean meat, chicken, fish, eggs, or tofu. One ounce (oz) of lean protein is equal to: ? 1 oz (28 g) of meat, chicken, or fish. ? 1 egg. ?  cup (62 g) of tofu.  Eat some foods each day that contain healthy fats, such as avocado, nuts, seeds, and  fish.   What foods should I eat? Fruits Berries. Apples. Oranges. Peaches. Apricots. Plums. Grapes. Mango. Papaya. Pomegranate. Kiwi. Cherries. Vegetables Lettuce. Spinach. Leafy greens, including kale, chard, collard greens, and mustard greens. Beets. Cauliflower. Cabbage. Broccoli. Carrots. Green beans. Tomatoes. Peppers. Onions. Cucumbers. Brussels sprouts. Grains Whole grains, such as whole-wheat or whole-grain bread, crackers, tortillas, cereal, and pasta. Unsweetened oatmeal. Quinoa. Brown or wild rice. Meats and other proteins Seafood. Poultry without skin. Lean cuts of poultry and beef. Tofu. Nuts. Seeds. Dairy Low-fat or fat-free dairy products such as milk, yogurt, and cheese. The items listed above may not be a complete list of foods and beverages you can eat. Contact a dietitian for more information. What foods should I avoid? Fruits Fruits canned with syrup. Vegetables Canned vegetables. Frozen vegetables with butter or cream sauce. Grains Refined  white flour and flour products such as bread, pasta, snack foods, and cereals. Avoid all processed foods. Meats and other proteins Fatty cuts of meat. Poultry with skin. Breaded or fried meats. Processed meat. Avoid saturated fats. Dairy Full-fat yogurt, cheese, or milk. Beverages Sweetened drinks, such as soda or iced tea. The items listed above may not be a complete list of foods and beverages you should avoid. Contact a dietitian for more information. Questions to ask a health care provider  Do I need to meet with a diabetes educator?  Do I need to meet with a dietitian?  What number can I call if I have questions?  When are the best times to check my blood glucose? Where to find more information:  American Diabetes Association: diabetes.org  Academy of Nutrition and Dietetics: www.eatright.[employer] of Diabetes and Digestive and Kidney Diseases: CarFlippers.tn  Association of Diabetes Care and  Education Specialists: www.diabeteseducator.org Summary  It is important to have healthy eating habits because your blood sugar (glucose) levels are greatly affected by what you eat and drink.  A healthy meal plan will help you control your blood glucose and maintain a healthy lifestyle.  Your health care provider may recommend that you work with a dietitian to make a meal plan that is best for you.  Keep in mind that carbohydrates (carbs) and alcohol have immediate effects on your blood glucose levels. It is important to count carbs and to use alcohol carefully. This information is not intended to replace advice given to you by your health care provider. Make sure you discuss any questions you have with your health care provider. Document Revised: 01/22/2019 Document Reviewed: 01/22/2019 Elsevier Patient Education  2021 [employer].

## 2020-06-08 NOTE — Assessment & Plan Note (Signed)
Hyperkalemic 5.4> 5.8 on 2 prior visits 3/25 and 3/28.  Has been off of losartan and Metformin and educated to decrease fruit consumption.  Will recheck BMP today to ensure this is improving.  Denies any symptoms.  Given uncontrolled diabetes, would like to restart Metformin and losartan if possible.   -If BMP shows normal potassium, will restart Losartan and Metformin with close follow-up for repeat BMP.

## 2020-06-09 ENCOUNTER — Other Ambulatory Visit: Payer: Self-pay | Admitting: Family Medicine

## 2020-06-09 DIAGNOSIS — E875 Hyperkalemia: Secondary | ICD-10-CM

## 2020-06-09 DIAGNOSIS — Z8639 Personal history of other endocrine, nutritional and metabolic disease: Secondary | ICD-10-CM

## 2020-06-09 LAB — BASIC METABOLIC PANEL
BUN/Creatinine Ratio: 12 (ref 10–24)
BUN: 23 mg/dL (ref 8–27)
CO2: 21 mmol/L (ref 20–29)
Calcium: 8.6 mg/dL (ref 8.6–10.2)
Chloride: 99 mmol/L (ref 96–106)
Creatinine, Ser: 1.88 mg/dL — ABNORMAL HIGH (ref 0.76–1.27)
Glucose: 307 mg/dL — ABNORMAL HIGH (ref 65–99)
Potassium: 4.5 mmol/L (ref 3.5–5.2)
Sodium: 135 mmol/L (ref 134–144)
eGFR: 38 mL/min/{1.73_m2} — ABNORMAL LOW (ref >59)

## 2020-06-09 LAB — LIPID PANEL
Chol/HDL Ratio: 2.6 ratio (ref 0.0–5.0)
Cholesterol, Total: 86 mg/dL — ABNORMAL LOW (ref 100–199)
HDL: 33 mg/dL — ABNORMAL LOW (ref >39)
LDL Chol Calc (NIH): 38 mg/dL (ref 0–99)
Triglycerides: 65 mg/dL (ref 0–149)
VLDL Cholesterol Cal: 15 mg/dL (ref 5–40)

## 2020-06-09 NOTE — Progress Notes (Signed)
Repeat BMP shows improvement of hyperkalemia, now within normal range. Called patient to discuss results. Advised him to restart metformin and Losartan as previously prescribed. Scheduled for lab only appointment on 4/27 for BMP to ensure stable potassium on medications. Patient able to verbalize plan back to me and all questions answered.

## 2020-06-16 ENCOUNTER — Telehealth: Payer: Self-pay | Admitting: *Deleted

## 2020-06-16 NOTE — Chronic Care Management (AMB) (Signed)
  Care Management   Note  06/16/2020 Name: KASEEM VASTINE MRN: [id number] DOB: December 15, 1950  DELONTE MUSICH is a 70 y.o. year old male who is a primary care patient of Sabino Dick, DO. I reached out to Constance Goltz by phone today in response to a referral sent by Mr. Javarius Tsosie Bollig's PCP, Sabino Dick, DO. Mr. Tomko was given information about care management services today including:  1. Care management services include personalized support from designated clinical staff supervised by his physician, including individualized plan of care and coordination with other care providers 2. 24/7 contact phone numbers for assistance for urgent and routine care needs. 3. The patient may stop care management services at any time by phone call to the office staff.  Patient agreed to services and verbal consent obtained.   Follow up plan: Telephone appointment with care management team member scheduled for:06/23/2020  [facility] Guide, Embedded Care Coordination [facility] Management

## 2020-06-23 ENCOUNTER — Ambulatory Visit: Payer: Medicare Other

## 2020-06-23 NOTE — Chronic Care Management (AMB) (Signed)
Care Management    RN Visit Note  06/23/2020 Name: Jordan Rodriguez MRN: [id number] DOB: [birth year]  Subjective: Jordan Rodriguez is a 70 y.o. year old male who is a primary care patient of Jordan Settler, DO. The care management team was consulted for assistance with disease management and care coordination needs.    Engaged with patient by telephone for initial visit in response to provider referral for case management and/or care coordination services.   Consent to Services:   Jordan Rodriguez was given information about Care Management services today including:  1. Care Management services includes personalized support from designated clinical staff supervised by his physician, including individualized plan of care and coordination with other care providers 2. 24/7 contact phone numbers for assistance for urgent and routine care needs. 3. The patient may stop case management services at any time by phone call to the office staff.  Patient agreed to services and consent obtained.    Assessment: Patient is currently experiencing difficulty with elevated blood sugars... See Care Plan below for interventions and patient self-care actives. Follow up Plan: Patient would like continued follow-up.  CCM RNCM will outreach the patient within the next 14 days  Patient will call office if needed prior to next encounter : Review of patient past medical history, allergies, medications, health status, including review of consultants reports, laboratory and other test data, was performed as part of comprehensive evaluation and provision of chronic care management services.   SDOH (Social Determinants of Health) assessments and interventions performed:    Care Plan  No Known Allergies  Outpatient Encounter Medications as of 06/23/2020  Medication Sig Note  . Accu-Chek Softclix Lancets lancets SMARTSIG:1 Topical 4 Times Daily   . Accu-Chek Softclix Lancets lancets Use as instructed   .  aspirin EC 81 MG tablet Take 1 tablet (81 mg total) by mouth daily.   Marland Kitchen atorvastatin (LIPITOR) 40 MG tablet Take 1 tablet (40 mg total) by mouth daily.   . blood glucose meter kit and supplies KIT Dispense based on patient and insurance preference. Use up to four times daily as directed. (FOR ICD-9 250.00, 250.01).   Marland Kitchen empagliflozin (JARDIANCE) 25 MG TABS tablet Take 25 mg by mouth daily.   Marland Kitchen glucose blood (ACCU-CHEK AVIVA) test strip Use to check blood sugar 3x a day. Dx Code: E11.8   . insulin glargine (LANTUS) 100 UNIT/ML Solostar Pen Inject 10 Units into the skin every morning. (Patient taking differently: Inject 12 Units into the skin every morning.) 06/23/2020: Patient 15 units in the morning.  . Insulin Pen Needle 32G X 4 MM MISC 1 Container by Does not apply route 3 (three) times daily.   Elmore Guise Devices (ACCU-CHEK SOFTCLIX) lancets Check blood glucose up to 3 times daily.   Marland Kitchen losartan (COZAAR) 25 MG tablet Take 25 mg by mouth at bedtime.   . metFORMIN (GLUCOPHAGE) 500 MG tablet Take 3 tablets (1,500 mg total) by mouth daily with breakfast.   . sildenafil (VIAGRA) 50 MG tablet Take 1 tablet (50 mg total) by mouth daily as needed for erectile dysfunction.   . sodium bicarbonate 650 MG tablet Take 650 mg by mouth 2 (two) times daily.   Marland Kitchen glucose blood (BAYER CONTOUR TEST) test strip Use to test blood sugar up to 4 times a day.  ICD-10 code E11.65. (Patient not taking: Reported on 06/23/2020)   . Semaglutide,0.25 or 0.5MG/DOS, (OZEMPIC, 0.25 OR 0.5 MG/DOSE,) 2 MG/1.5ML SOPN Inject 0.25 mg into the skin  once a week for 4 weeks. AFTER 4 weeks, increase to 0.5 mg weekly. (Patient not taking: No sig reported)    No facility-administered encounter medications on file as of 06/23/2020.    Patient Active Problem List   Diagnosis Date Noted  . Hyperkalemia 05/25/2020  . Muscle cramps 11/11/2019  . Erectile dysfunction 11/11/2019  . Advance directive discussed with patient 07/15/2019  . CKD  (chronic kidney disease) stage 3, GFR 30-59 ml/min (HCC) 06/26/2019  . Hyperlipidemia 12/24/2015  . Diverticulosis of colon 10/29/2013  . Controlled diabetes mellitus type 2 with complications ([place]) 80/32/1224    Conditions to be addressed/monitored: DMII  Patient Care Plan: RN CAse Manager  Problem Identified: Glycemic Management (Diabetes, Type 2)   Long-Range Goal: Patient will monitor and manage diabetes with medication and food intake.   Start Date: 06/23/2020  Expected End Date: 08/27/2020  Priority: High  Objective:  Lab Results  Component Value Date   HGBA1C 10.8 (A) 06/08/2020 .   Lab Results  Component Value Date   CREATININE 1.88 (H) 06/08/2020   CREATININE 1.77 (H) 05/25/2020   CREATININE 1.93 (H) 05/22/2020 .   Lab Results  Component Value Date   EGFR 38 (L) 06/08/2020 .    Current Barriers:  Marland Kitchen Knowledge Deficits related to basic Diabetes pathophysiology and self care/management  Case Manager Clinical Goal(s):  Over the next 90 days, patient will demonstrate improved adherence to prescribed treatment plan for diabetes self care/management as evidenced by: lowering his a1c by 1-2 points.  Interventions:  . Provided education to patient about basic DM disease process . Reviewed medications with patient and discussed importance of medication adherence . Discussed plans with patient for ongoing care management follow up and provided patient with direct contact information for care management team . Will  mail the  patient written educational materials related to hypo and hyperglycemia and importance of correct treatment . Review of patient status, including review of consultants reports, relevant laboratory and other test results, and medications completed. Marland Kitchen Has your doctor given you an a1c target? Yes 7  or below . What type of glucometer do you use? Accu check . Have you received diabetes education in the past? Yews he states that it has been in the last 2  years. . During the past week or month has your blood sugar dropped below 70? Yes he states that he had 1 event of a low 60. Reviewed the hypoglycemic protocol.  Patient verbalized understanding.  He states that he did not eat enough food. . - blood glucose monitoring encouraged . - blood glucose readings reviewed this morning 96 . - healthy lifestyle promoted . Patient stated that he has an appointment 06/24/20 for labs to see if he will need adjustments to his medications.  Patient Goals/Self Care Activities:   Patient verbalizes understanding of plan Self-administers medications as prescribed Calls pharmacy for medication refills Call's provider office for new concerns or questions check blood sugar at prescribed times      Lazaro Arms RN, BSN, [facility] Care Management Coordinator [place] Phone: [phone] I Fax: [phone]

## 2020-06-23 NOTE — Patient Instructions (Signed)
Mr. Newsham  it was nice speaking with you. Please call me directly [phone] if you have questions about the goals we discussed.  Goals Addressed            This Visit's Progress   . Monitor and Manage My Blood Sugar-Diabetes Type 2       Timeframe:  Long-Range Goal Priority:  High Start Date:    06/23/20                         Expected End Date:    09/25/20                   Follow Up Date 07/07/20    - check blood sugar at prescribed times - take the blood sugar log to all doctor visits    Why is this important?    Checking your blood sugar at home helps to keep it from getting very high or very low.   Writing the results in a diary or log helps the doctor know how to care for you.   Your blood sugar log should have the time, date and the results.   Also, write down the amount of insulin or other medicine that you take.   Other information, like what you ate, exercise done and how you were feeling, will also be helpful.     Notes:        Patient Care Plan: RN CAse Manager  Problem Identified: Glycemic Management (Diabetes, Type 2)   Long-Range Goal: Patient will monitor and manage diabetes with medication and food intake.   Start Date: 06/23/2020  Expected End Date: 08/27/2020  Priority: High  Objective:  Lab Results  Component Value Date   HGBA1C 10.8 (A) 06/08/2020 .   Lab Results  Component Value Date   CREATININE 1.88 (H) 06/08/2020   CREATININE 1.77 (H) 05/25/2020   CREATININE 1.93 (H) 05/22/2020 .   Lab Results  Component Value Date   EGFR 38 (L) 06/08/2020 .    Current Barriers:  Marland Kitchen Knowledge Deficits related to basic Diabetes pathophysiology and self care/management  Case Manager Clinical Goal(s):  Over the next 90 days, patient will demonstrate improved adherence to prescribed treatment plan for diabetes self care/management as evidenced by: lowering his a1c by 1-2 points.  Interventions:  . Provided education to patient about basic DM  disease process . Reviewed medications with patient and discussed importance of medication adherence . Discussed plans with patient for ongoing care management follow up and provided patient with direct contact information for care management team . Will  mail the  patient written educational materials related to hypo and hyperglycemia and importance of correct treatment . Review of patient status, including review of consultants reports, relevant laboratory and other test results, and medications completed. Marland Kitchen Has your doctor given you an a1c target? Yes 7  or below . What type of glucometer do you use? Accu check . Have you received diabetes education in the past? Yews he states that it has been in the last 2 years. . During the past week or month has your blood sugar dropped below 70? Yes he states that he had 1 event of a low 60. Reviewed the hypoglycemic protocol.  Patient verbalized understanding.  He states that he did not eat enough food. . - blood glucose monitoring encouraged . - blood glucose readings reviewed this morning 96 . - healthy lifestyle promoted . Patient stated that he has  an appointment 06/24/20 for labs to see if he will need adjustments to his medications.  Patient Goals/Self Care Activities:   Patient verbalizes understanding of plan Self-administers medications as prescribed Calls pharmacy for medication refills Call's provider office for new concerns or questions check blood sugar at prescribed times       Mr. Kampa received Care Management services today:  1. Care Management services include personalized support from designated clinical staff supervised by his physician, including individualized plan of care and coordination with other care providers 2. 24/7 contact [phone] for assistance for urgent and routine care needs. 3. Care Management are voluntary services and be declined at any time by calling the office.  The patient verbalized understanding  of instructions provided today and declined a print copy of patient instruction materials.    Follow Up Plan: Patient would like continued follow-up.  CCM RNCM will outreach the patient within the next 14 days.  Patient will call office if needed prior to next encounter   Lazaro Arms, RN   [phone]

## 2020-06-24 ENCOUNTER — Other Ambulatory Visit: Payer: Medicare Other

## 2020-06-24 ENCOUNTER — Other Ambulatory Visit: Payer: Self-pay

## 2020-06-24 DIAGNOSIS — Z8639 Personal history of other endocrine, nutritional and metabolic disease: Secondary | ICD-10-CM

## 2020-06-24 DIAGNOSIS — Z7689 Persons encountering health services in other specified circumstances: Secondary | ICD-10-CM | POA: Diagnosis not present

## 2020-06-25 LAB — BASIC METABOLIC PANEL
BUN/Creatinine Ratio: 16 (ref 10–24)
BUN: 32 mg/dL — ABNORMAL HIGH (ref 8–27)
CO2: 20 mmol/L (ref 20–29)
Calcium: 8.9 mg/dL (ref 8.6–10.2)
Chloride: 104 mmol/L (ref 96–106)
Creatinine, Ser: 1.96 mg/dL — ABNORMAL HIGH (ref 0.76–1.27)
Glucose: 109 mg/dL — ABNORMAL HIGH (ref 65–99)
Potassium: 4.9 mmol/L (ref 3.5–5.2)
Sodium: 140 mmol/L (ref 134–144)
eGFR: 37 mL/min/{1.73_m2} — ABNORMAL LOW (ref >59)

## 2020-06-29 ENCOUNTER — Other Ambulatory Visit: Payer: Self-pay

## 2020-06-29 MED ORDER — ATORVASTATIN CALCIUM 40 MG PO TABS: 40.0000 mg | ORAL_TABLET | Freq: Every day | ORAL | 3 refills | Status: DC

## 2020-06-30 ENCOUNTER — Other Ambulatory Visit: Payer: Self-pay

## 2020-07-03 MED ORDER — ASPIRIN EC 81 MG PO TBEC: 81.0000 mg | DELAYED_RELEASE_TABLET | Freq: Every day | ORAL | 3 refills | Status: DC

## 2020-07-03 MED ORDER — EMPAGLIFLOZIN 25 MG PO TABS: 25.0000 mg | ORAL_TABLET | Freq: Every day | ORAL | 3 refills | Status: DC

## 2020-07-07 ENCOUNTER — Ambulatory Visit: Payer: Medicare Other

## 2020-07-07 NOTE — Chronic Care Management (AMB) (Signed)
   RN Case Manager Care Management   Phone Outreach    07/07/2020 Name: Jordan Rodriguez MRN: [id number] DOB: [birth year]  [name] is a 70 y.o. year old male who is a primary care patient of Sabino Dick, DO .   Unable to keep phone appointment today and requested to reschedule.  Plan:Appointment was rescheduled with CCM [facility] 07/08/20 @ 1030 am.  Review of patient status, including review of consultants reports, relevant laboratory and other test results, and collaboration with appropriate care team members and the patient's provider was performed as part of comprehensive patient evaluation and provision of care management services.    Juanell Fairly RN, BSN, [facility] Care Management Coordinator [facility] Phone: [phone] I Fax: [phone]

## 2020-07-08 ENCOUNTER — Ambulatory Visit: Payer: Medicare Other

## 2020-07-08 NOTE — Chronic Care Management (AMB) (Signed)
Care Management    RN Visit Note  07/08/2020 Name: Jordan Rodriguez MRN: [id number] DOB: 01-23-1951  Subjective: Jordan Rodriguez is a 70 y.o. year old male who is a primary care patient of Jordan Settler, DO. The care management team was consulted for assistance with disease management and care coordination needs.    Engaged with patient by telephone for follow up visit in response to provider referral for case management and/or care coordination services.   The patient was given information about Chronic Care Management services, agreed to services, and gave verbal consent prior to initiation of services.  Please see initial visit note for detailed documentation.   Patient agreed to services and consent obtained.    Assessment: Patient is making progress with his blood sugars and checking them on a regular basis. . See Care Plan below for interventions and patient self-care actives. Follow up Plan: Patient would like continued follow-up.  CCM RNCM will outreach the patient within the next 2 weeks.  Patient will call office if needed prior to next encounter : Review of patient past medical history, allergies, medications, health status, including review of consultants reports, laboratory and other test data, was performed as part of comprehensive evaluation and provision of chronic care management services.   SDOH (Social Determinants of Health) assessments and interventions performed:    Care Plan  No Known Allergies  Outpatient Encounter Medications as of 07/08/2020  Medication Sig Note  . Accu-Chek Softclix Lancets lancets SMARTSIG:1 Topical 4 Times Daily   . Accu-Chek Softclix Lancets lancets Use as instructed   . aspirin EC 81 MG tablet Take 1 tablet (81 mg total) by mouth daily.   Jordan Rodriguez atorvastatin (LIPITOR) 40 MG tablet Take 1 tablet (40 mg total) by mouth daily.   . blood glucose meter kit and supplies KIT Dispense based on patient and insurance preference. Use up to four  times daily as directed. (FOR ICD-9 250.00, 250.01).   Jordan Rodriguez empagliflozin (JARDIANCE) 25 MG TABS tablet Take 1 tablet (25 mg total) by mouth daily.   Jordan Rodriguez glucose blood (ACCU-CHEK AVIVA) test strip Use to check blood sugar 3x a day. Dx Code: E11.8   . glucose blood (BAYER CONTOUR TEST) test strip Use to test blood sugar up to 4 times a day.  ICD-10 code E11.65. (Patient not taking: Reported on 06/23/2020)   . insulin glargine (LANTUS) 100 UNIT/ML Solostar Pen Inject 10 Units into the skin every morning. (Patient taking differently: Inject 12 Units into the skin every morning.) 06/23/2020: Patient 15 units in the morning.  . Insulin Pen Needle 32G X 4 MM MISC 1 Container by Does not apply route 3 (three) times daily.   Jordan Rodriguez Devices (ACCU-CHEK SOFTCLIX) lancets Check blood glucose up to 3 times daily.   Jordan Rodriguez losartan (COZAAR) 25 MG tablet Take 25 mg by mouth at bedtime.   . metFORMIN (GLUCOPHAGE) 500 MG tablet Take 3 tablets (1,500 mg total) by mouth daily with breakfast.   . Semaglutide,0.25 or 0.5MG/DOS, (OZEMPIC, 0.25 OR 0.5 MG/DOSE,) 2 MG/1.5ML SOPN Inject 0.25 mg into the skin once a week for 4 weeks. AFTER 4 weeks, increase to 0.5 mg weekly. (Patient not taking: No sig reported)   . sildenafil (VIAGRA) 50 MG tablet Take 1 tablet (50 mg total) by mouth daily as needed for erectile dysfunction.   . sodium bicarbonate 650 MG tablet Take 650 mg by mouth 2 (two) times daily.    No facility-administered encounter medications on file as of  07/08/2020.    Patient Active Problem List   Diagnosis Date Noted  . Hyperkalemia 05/25/2020  . Muscle cramps 11/11/2019  . Erectile dysfunction 11/11/2019  . Advance directive discussed with patient 07/15/2019  . CKD (chronic kidney disease) stage 3, GFR 30-59 ml/min (HCC) 06/26/2019  . Hyperlipidemia 12/24/2015  . Diverticulosis of colon 10/29/2013  . Controlled diabetes mellitus type 2 with complications ([place]) 68/25/7493    Conditions to be addressed/monitored:  DMII  Care Plan : RN CAse Manager  Updates made by Lazaro Arms, RN since 07/08/2020 12:00 AM  Problem: Glycemic Management (Diabetes, Type 2)   Long-Range Goal: Patient will monitor and manage diabetes with medication and food intake.   Start Date: 06/23/2020  Expected End Date: 08/27/2020  This Visit's Progress: On track  Priority: High  Note:   Objective:  Lab Results  Component Value Date   HGBA1C 10.8 (A) 06/08/2020 .   Lab Results  Component Value Date   CREATININE 1.96 (H) 06/24/2020   CREATININE 1.88 (H) 06/08/2020   CREATININE 1.77 (H) 05/25/2020 .   Lab Results  Component Value Date   EGFR 37 (L) 06/24/2020 .    Current Barriers:  Jordan Rodriguez Knowledge Deficits related to basic Diabetes pathophysiology and self care/management  Case Manager Clinical Goal(s):  Over the next 90 days, patient will demonstrate improved adherence to prescribed treatment plan for diabetes self care/management as evidenced by: lowering his a1c by 1-2 points.  Interventions:  . Provided education to patient about basic DM disease process . Reviewed medications with patient and discussed importance of medication adherence . Discussed plans with patient for ongoing care management follow up and provided patient with direct contact information for care management team . Will  mail the  patient written educational materials related to hypo and hyperglycemia and importance of correct treatment patient received the educational material and is reviewing it  . Review of patient status, including review of consultants reports, relevant laboratory and other test results, and medications completed. Jordan Rodriguez Has your doctor given you an a1c target? Yes 7  or below . What type of glucometer do you use? Accu check . Have you received diabetes education in the past? Yews he states that it has been in the last 2 years. . Reviewed the hypoglycemic protocol.  Patient verbalized understanding.   . - blood glucose monitoring  encouraged he states that he will check 2 to 3 times a day . - blood glucose readings reviewed this morning 76 and the evening 74 Advised the patient to closely monitor his blood sugars and make sure that he doe snot let his blood sugars go below 70 . - healthy lifestyle promoted  Patient Goals/Self Care Activities:   Patient verbalizes understanding of plan Self-administers medications as prescribed Calls pharmacy for medication refills Call's provider office for new concerns or questions check blood sugar at prescribed times      Lazaro Arms RN, BSN, [place] Phone: [phone] I Fax: [phone]

## 2020-07-08 NOTE — Patient Instructions (Signed)
Visit Information  Mr. Woodin  it was nice speaking with you. Please call me directly [phone] if you have questions about the goals we discussed.  Goals Addressed            This Visit's Progress   . Monitor and Manage My Blood Sugar-Diabetes Type 2       Timeframe:  Long-Range Goal Priority:  High Start Date:    06/23/20                         Expected End Date:    09/25/20                     - check blood sugar at prescribed times - take the blood sugar log to all doctor visits    Why is this important?    Checking your blood sugar at home helps to keep it from getting very high or very low.   Writing the results in a diary or log helps the doctor know how to care for you.   Your blood sugar log should have the time, date and the results.   Also, write down the amount of insulin or other medicine that you take.   Other information, like what you ate, exercise done and how you were feeling, will also be helpful.     Notes:        The patient verbalized understanding of instructions, educational materials, and care plan provided today and declined offer to receive copy of patient instructions, educational materials, and care plan.   Follow up Plan: Patient would like continued follow-up.  CCM RNCM will outreach the patient within the next 2 weeks  Patient will call office if needed prior to next encounter  Juanell Fairly, RN  [phone]

## 2020-07-15 ENCOUNTER — Other Ambulatory Visit: Payer: Self-pay | Admitting: Family Medicine

## 2020-07-15 DIAGNOSIS — Z794 Long term (current) use of insulin: Secondary | ICD-10-CM

## 2020-07-15 DIAGNOSIS — E118 Type 2 diabetes mellitus with unspecified complications: Secondary | ICD-10-CM

## 2020-07-24 ENCOUNTER — Telehealth: Payer: Medicare Other

## 2020-07-30 ENCOUNTER — Telehealth: Payer: Medicare Other

## 2020-07-30 ENCOUNTER — Telehealth: Payer: Self-pay

## 2020-07-30 NOTE — Telephone Encounter (Signed)
   RN Case Manager Care Management   Phone Outreach    07/30/2020 Name: Jordan Rodriguez MRN: [id number] DOB: 1950-10-20  Jordan Rodriguez is a 70 y.o. year old male who is a primary care patient of Sabino Dick, DO .   Telephone outreach was unsuccessful A HIPPA compliant phone message was left for the patient providing contact information and requesting a return call.   Plan:Appointment was rescheduled with CCM RNCM on 09/03/20 at 11:30  Review of patient status, including review of consultants reports, relevant laboratory and other test results, and collaboration with appropriate care team members and the patient's provider was performed as part of comprehensive patient evaluation and provision of care management services.    Juanell Fairly RN, BSN, [facility] Care Management Coordinator [facility] Phone: [phone] I Fax: [phone]

## 2020-08-26 DIAGNOSIS — N189 Chronic kidney disease, unspecified: Secondary | ICD-10-CM | POA: Diagnosis not present

## 2020-08-26 DIAGNOSIS — R809 Proteinuria, unspecified: Secondary | ICD-10-CM | POA: Diagnosis not present

## 2020-08-26 DIAGNOSIS — I129 Hypertensive chronic kidney disease with stage 1 through stage 4 chronic kidney disease, or unspecified chronic kidney disease: Secondary | ICD-10-CM | POA: Diagnosis not present

## 2020-08-26 DIAGNOSIS — E1122 Type 2 diabetes mellitus with diabetic chronic kidney disease: Secondary | ICD-10-CM | POA: Diagnosis not present

## 2020-08-26 DIAGNOSIS — N1832 Chronic kidney disease, stage 3b: Secondary | ICD-10-CM | POA: Diagnosis not present

## 2020-08-26 DIAGNOSIS — E875 Hyperkalemia: Secondary | ICD-10-CM | POA: Diagnosis not present

## 2020-08-26 DIAGNOSIS — N2581 Secondary hyperparathyroidism of renal origin: Secondary | ICD-10-CM | POA: Diagnosis not present

## 2020-08-26 DIAGNOSIS — D631 Anemia in chronic kidney disease: Secondary | ICD-10-CM | POA: Diagnosis not present

## 2020-09-03 ENCOUNTER — Ambulatory Visit: Payer: Self-pay

## 2020-09-03 ENCOUNTER — Telehealth: Payer: Medicare Other

## 2020-09-03 DIAGNOSIS — Z794 Long term (current) use of insulin: Secondary | ICD-10-CM

## 2020-09-03 NOTE — Chronic Care Management (AMB) (Signed)
Care Management    RN Visit Note  09/03/2020 Name: Jordan Rodriguez MRN: [id number] DOB: 1951/01/16  Subjective: Jordan Rodriguez is a 70 y.o. year old male who is a primary care patient of Sharion Settler, DO. The care management team was consulted for assistance with disease management and care coordination needs.    Engaged with patient by telephone for follow up visit in response to provider referral for case management and/or care coordination services.   Consent to Services:   Mr. Skirvin was given information about Care Management services today including:  Care Management services includes personalized support from designated clinical staff supervised by his physician, including individualized plan of care and coordination with other care providers 24/7 contact phone numbers for assistance for urgent and routine care needs. The patient may stop case management services at any time by phone call to the office staff.  Patient agreed to services and consent obtained.   Assessment: Review of patient past medical history, allergies, medications, health status, including review of consultants reports, laboratory and other test data, was performed as part of comprehensive evaluation and provision of chronic care management services.   SDOH (Social Determinants of Health) assessments and interventions performed:    Care Plan  No Known Allergies  Outpatient Encounter Medications as of 09/03/2020  Medication Sig Note   Accu-Chek Softclix Lancets lancets SMARTSIG:1 Topical 4 Times Daily    Accu-Chek Softclix Lancets lancets Use as instructed    aspirin EC 81 MG tablet Take 1 tablet (81 mg total) by mouth daily.    atorvastatin (LIPITOR) 40 MG tablet Take 1 tablet (40 mg total) by mouth daily.    blood glucose meter kit and supplies KIT Dispense based on patient and insurance preference. Use up to four times daily as directed. (FOR ICD-9 250.00, 250.01).    empagliflozin  (JARDIANCE) 25 MG TABS tablet Take 1 tablet (25 mg total) by mouth daily.    glucose blood (ACCU-CHEK AVIVA) test strip Use to check blood sugar 3x a day. Dx Code: E11.8    glucose blood (BAYER CONTOUR TEST) test strip Use to test blood sugar up to 4 times a day.  ICD-10 code E11.65. (Patient not taking: Reported on 06/23/2020)    insulin glargine (LANTUS) 100 UNIT/ML Solostar Pen Inject 10 Units into the skin every morning. (Patient taking differently: Inject 12 Units into the skin every morning.) 06/23/2020: Patient 15 units in the morning.   Insulin Pen Needle 32G X 4 MM MISC 1 Container by Does not apply route 3 (three) times daily.    Lancet Devices (ACCU-CHEK SOFTCLIX) lancets Check blood glucose up to 3 times daily.    losartan (COZAAR) 25 MG tablet Take 25 mg by mouth at bedtime.    metFORMIN (GLUCOPHAGE) 500 MG tablet TAKE 3 TABLETS BY MOUTH ONCE DAILY WITH BREAKFAST    Semaglutide,0.25 or 0.5MG/DOS, (OZEMPIC, 0.25 OR 0.5 MG/DOSE,) 2 MG/1.5ML SOPN Inject 0.25 mg into the skin once a week for 4 weeks. AFTER 4 weeks, increase to 0.5 mg weekly. (Patient not taking: No sig reported)    sildenafil (VIAGRA) 50 MG tablet Take 1 tablet (50 mg total) by mouth daily as needed for erectile dysfunction.    sodium bicarbonate 650 MG tablet Take 650 mg by mouth 2 (two) times daily.    No facility-administered encounter medications on file as of 09/03/2020.    Patient Active Problem List   Diagnosis Date Noted   Hyperkalemia 05/25/2020   Muscle cramps 11/11/2019  Erectile dysfunction 11/11/2019   Advance directive discussed with patient 07/15/2019   CKD (chronic kidney disease) stage 3, GFR 30-59 ml/min ([place]) 06/26/2019   Hyperlipidemia 12/24/2015   Diverticulosis of colon 10/29/2013   Controlled diabetes mellitus type 2 with complications ([place]) 01/41/0301    Conditions to be addressed/monitored: DMII  Care Plan : RN CAse Manager  Updates made by Inge Rise, RN since 09/03/2020 12:00 AM      Problem: Glycemic Management (Diabetes, Type 2)      Long-Range Goal: Patient will monitor and manage diabetes with medication and food intake.   Start Date: 06/23/2020  Expected End Date: 12/27/2020  This Visit's Progress: On track  Recent Progress: On track  Priority: High  Note:   Objective:  Lab Results  Component Value Date   HGBA1C 10.8 (A) 06/08/2020    Lab Results  Component Value Date   CREATININE 1.96 (H) 06/24/2020   CREATININE 1.88 (H) 06/08/2020   CREATININE 1.77 (H) 05/25/2020   Lab Results  Component Value Date   EGFR 37 (L) 06/24/2020    Current Barriers:  Knowledge Deficits related to basic Diabetes pathophysiology and self care/management  Case Manager Clinical Goal(s):  Over the next 90 days, patient will demonstrate improved adherence to prescribed treatment plan for diabetes self care/management as evidenced by: lowering his a1c by 1-2 points.  Interventions:  Provided education to patient about basic DM disease process Reviewed medications with patient and discussed importance of medication adherence. 09/03/2020:  Patient continues to be compliant with medications. Discussed plans with patient for ongoing care management follow up and provided patient with direct contact information for care management team Will  mail the  patient written educational materials related to hypo and hyperglycemia and importance of correct treatment. Patient received the educational material and is reviewing it.  Review of patient status, including review of consultants reports, relevant laboratory and other test results, and medications completed. Has your doctor given you an a1c target? Yes 7  or below What type of glucometer do you use? Accu check Have you received diabetes education in the past? Yews he states that it has been in the last 2 years. Reviewed the hypoglycemic protocol.  Patient verbalized understanding.   - blood glucose monitoring encouraged 09/03/2020:  Patient continues to check blood sugars 3 times per day. - blood glucose readings reviewed.  09/03/2020: Patient checking blood sugars 3 times daily (before breakfast, lunch and dinner).  Blood sugar range mainly between low 100 - 200.  FBS today 110. Patient is having low blood sugars which drop into 50s - 70s in the afternoon.  Yesterday's reading at 4 pm was 50. Patient is symptomatic: feels "tired and weak". RNCM reviewed with patient signs & symptoms of hypoglycemia and what to do when hypoglycemia episodes occur.  Patient verbalized understanding.  Patient is open to making follow up PCP visit in near future. Patient continues to be compliant with medications. - healthy lifestyle promoted  Patient Goals/Self Care Activities:   Patient verbalizes understanding of plan Self-administers medications as prescribed Calls pharmacy for medication refills Call's provider office for new concerns or questions check blood sugar at prescribed times      Plan: Telephone follow up appointment with care management team member scheduled for:  09/24/2020 at 10:30 am  [place], [place] Mobile: [phone]

## 2020-09-03 NOTE — Patient Instructions (Signed)
Visit Information   Goals Addressed             This Visit's Progress    Monitor and Manage My Blood Sugar-Diabetes Type 2       Timeframe:  Long-Range Goal Priority:  High Start Date:    06/23/20                         Expected End Date:    12/27/20                   Follow up visit: 09/24/2020   - check blood sugar at prescribed times - take the blood sugar log to all doctor visits    Why is this important?   Checking your blood sugar at home helps to keep it from getting very high or very low.  Writing the results in a diary or log helps the doctor know how to care for you.  Your blood sugar log should have the time, date and the results.  Also, write down the amount of insulin or other medicine that you take.  Other information, like what you ate, exercise done and how you were feeling, will also be helpful.    Notes: 09/03/2020: Patient checking blood sugars 3 times daily (before breakfast, lunch and dinner).  Blood sugar range mainly between low 100 - 200.  FBS today 110. Patient is having low blood sugars which drop into 50s - 70s in the afternoon.  Yesterday's reading at 4 pm was 50. Patient is symptomatic: feels "tired and weak". RNCM reviewed with patient signs & symptoms of hypoglycemia and what to do when hypoglycemia episodes occurs.  Patient verbalized understanding.  Patient is open to making follow up PCP visit in near future. Patient continues to be compliant with medications.          The patient verbalized understanding of instructions, educational materials, and care plan provided today and declined offer to receive copy of patient instructions, educational materials, and care plan.   Telephone follow up appointment with care management team member scheduled for:09/24/2020 at 10:30 am  Virgina Norfolk RN, BSN Community Care Coordinator [place]  [facility] Mobile: [phone]

## 2020-09-16 NOTE — Telephone Encounter (Signed)
This encounter was created in error - please disregard.

## 2020-09-24 ENCOUNTER — Telehealth: Payer: Self-pay

## 2020-09-24 ENCOUNTER — Telehealth: Payer: Medicaid Other

## 2020-09-24 NOTE — Telephone Encounter (Signed)
   RN Case Manager Care Management   Phone Outreach    09/24/2020 Name: RISHITH SIDDOWAY MRN: [id number] DOB: Aug 04, 1950  JAQUIS PICKLESIMER is a 70 y.o. year old male who is a primary care patient of Sabino Dick, DO .   RNCM reached out to the patient by phone today and left a message for return call.  The patient sent a text message stating that he did not have enough minutes to talk today and was unable to keep the phone appointment and requested to reschedule for next month when he could purchase more minutes.  Plan:Appointment was rescheduled with CCM RNCM on August 2, 22 at 3 pm per patient request.  Review of patient status, including review of consultants reports, relevant laboratory and other test results, and collaboration with appropriate care team members and the patient's provider was performed as part of comprehensive patient evaluation and provision of care management services.    Juanell Fairly RN, BSN, [facility] Care Management Coordinator [facility] Phone: [phone] I Fax: [phone]

## 2020-09-29 ENCOUNTER — Ambulatory Visit: Payer: Medicaid Other

## 2020-09-29 DIAGNOSIS — Z139 Encounter for screening, unspecified: Secondary | ICD-10-CM

## 2020-09-29 NOTE — Chronic Care Management (AMB) (Signed)
Chronic Care Management   CCM RN Visit Note  09/29/2020 Name: Jordan Rodriguez MRN: [id number] DOB: 11-02-1950  Subjective: Jordan Rodriguez is a 70 y.o. year old male who is a primary care patient of Jordan Settler, DO. The care management team was consulted for assistance with disease management and care coordination needs.    Engaged with patient by telephone for follow up visit in response to provider referral for case management and/or care coordination services.   Consent to Services:  The patient was given information about Chronic Care Management services, agreed to services, and gave verbal consent prior to initiation of services.  Please see initial visit note for detailed documentation.   Patient agreed to services and verbal consent obtained.    Assessment: Patient continues to experience difficulty with with low blood sugars. See Care Plan below for interventions and patient self-care actives. Follow up Plan: Patient would like continued follow-up.  CCM RNCM will outreach the patient within the next 5 weeks  Patient will call office if needed prior to next encounter  Review of patient past medical history, allergies, medications, health status, including review of consultants reports, laboratory and other test data, was performed as part of comprehensive evaluation and provision of chronic care management services.   SDOH (Social Determinants of Health) assessments and interventions performed:    CCM Care Plan  No Known Allergies  Outpatient Encounter Medications as of 09/29/2020  Medication Sig Note   Accu-Chek Softclix Lancets lancets SMARTSIG:1 Topical 4 Times Daily    Accu-Chek Softclix Lancets lancets Use as instructed    aspirin EC 81 MG tablet Take 1 tablet (81 mg total) by mouth daily.    atorvastatin (LIPITOR) 40 MG tablet Take 1 tablet (40 mg total) by mouth daily.    blood glucose meter kit and supplies KIT Dispense based on patient and insurance  preference. Use up to four times daily as directed. (FOR ICD-9 250.00, 250.01).    empagliflozin (JARDIANCE) 25 MG TABS tablet Take 1 tablet (25 mg total) by mouth daily.    glucose blood (ACCU-CHEK AVIVA) test strip Use to check blood sugar 3x a day. Dx Code: E11.8    glucose blood (BAYER CONTOUR TEST) test strip Use to test blood sugar up to 4 times a day.  ICD-10 code E11.65. (Patient taking differently: Use to test blood sugar up to 4 times a day.  ICD-10 code E11.65.)    insulin glargine (LANTUS) 100 UNIT/ML Solostar Pen Inject 10 Units into the skin every morning. (Patient taking differently: Inject 12 Units into the skin every morning.) 06/23/2020: Patient 15 units in the morning.   Insulin Pen Needle 32G X 4 MM MISC 1 Container by Does not apply route 3 (three) times daily.    Lancet Devices (ACCU-CHEK SOFTCLIX) lancets Check blood glucose up to 3 times daily.    losartan (COZAAR) 25 MG tablet Take 25 mg by mouth at bedtime.    metFORMIN (GLUCOPHAGE) 500 MG tablet TAKE 3 TABLETS BY MOUTH ONCE DAILY WITH BREAKFAST    Semaglutide,0.25 or 0.5MG/DOS, (OZEMPIC, 0.25 OR 0.5 MG/DOSE,) 2 MG/1.5ML SOPN Inject 0.25 mg into the skin once a week for 4 weeks. AFTER 4 weeks, increase to 0.5 mg weekly.    sodium bicarbonate 650 MG tablet Take 650 mg by mouth 2 (two) times daily.    sildenafil (VIAGRA) 50 MG tablet Take 1 tablet (50 mg total) by mouth daily as needed for erectile dysfunction.    No facility-administered encounter medications  on file as of 09/29/2020.    Patient Active Problem List   Diagnosis Date Noted   Hyperkalemia 05/25/2020   Muscle cramps 11/11/2019   Erectile dysfunction 11/11/2019   Advance directive discussed with patient 07/15/2019   CKD (chronic kidney disease) stage 3, GFR 30-59 ml/min ([place]) 06/26/2019   Hyperlipidemia 12/24/2015   Diverticulosis of colon 10/29/2013   Controlled diabetes mellitus type 2 with complications ([place]) 58/10/9831    Conditions to be  addressed/monitored:DMII  Patient Care Plan: RN CAse Manager     Problem Identified: Glycemic Management (Diabetes, Type 2)      Long-Range Goal: Patient will monitor and manage diabetes with medication and food intake.   Start Date: 06/23/2020  Expected End Date: 12/27/2020  Recent Progress: On track  Priority: High  Note:   Objective:  Lab Results  Component Value Date   HGBA1C 10.8 (A) 06/08/2020    Lab Results  Component Value Date   CREATININE 1.96 (H) 06/24/2020   CREATININE 1.88 (H) 06/08/2020   CREATININE 1.77 (H) 05/25/2020   Lab Results  Component Value Date   EGFR 37 (L) 06/24/2020    Current Barriers:  Knowledge Deficits related to basic Diabetes pathophysiology and self care/management  Case Manager Clinical Goal(s):  Over the next 90 days, patient will demonstrate improved adherence to prescribed treatment plan for diabetes self care/management as evidenced by: lowering his a1c by 1-2 points.  Interventions:  Provided education to patient about basic DM disease process Reviewed medications with patient and discussed importance of medication adherence.   Patient continues to be compliant with medications. Discussed plans with patient for ongoing care management follow up and provided patient with direct contact information for care management team Will  mail the  patient written educational materials related to hypo and hyperglycemia and importance of correct treatment. Patient received the educational material and is reviewing it.  Review of patient status, including review of consultants reports, relevant laboratory and other test results, and medications completed. Has your doctor given you an a1c target? Yes 7  or below What type of glucometer do you use? Accu check Have you received diabetes education in the past? Yews he states that it has been in the last 2 years. Reviewed the hypoglycemic protocol.  Patient verbalized understanding.   - blood glucose  monitoring encouraged 09/03/2020: Patient continues to check blood sugars 3 times per day. - blood glucose readings reviewed.  09/29/20: Patient checking blood sugars 3 times daily (before breakfast, lunch and dinner).   FBS today 95 at 730 am. Before he ate his breakfast. Patient had a low of 65 around 4 pm on yesterday afternoon.  After talking with him about his eating pattern.  He is waiting about 6 hours in between meals.  He will eat his breakfast take his medication that morning and not eat again till around 4 pm.  Then he starts to feel  symptomatic: feels "tired and weak". RNCM reviewed with patient signs & symptoms of hypoglycemia and what to do when hypoglycemia episodes occur.  Patient verbalized understanding.  Advised the patient to start eating on a more regular basis not so far apart in time.  We discussed diet.  He verbalized understanding. If he continues to experience lows  he will make a follow up visit with his PCP.  - healthy lifestyle promoted Patient expressed that he has some financial concerns and RNCM will send a Referral to the care guides to help.  Patient Goals/Self Care Activities:  Patient verbalizes understanding of plan Self-administers medications as prescribed Calls pharmacy for medication refills Call's provider office for new concerns or questions check blood sugar at prescribed times          Lazaro Arms RN, BSN, [facility] Care Management Coordinator [place] Phone: [phone] I Fax: [phone]

## 2020-09-29 NOTE — Patient Instructions (Signed)
Visit Information  Jordan Rodriguez  it was nice speaking with you. Please call me directly [phone] if you have questions about the goals we discussed.   Goals Addressed             This Visit's Progress    Monitor and Manage My Blood Sugar-Diabetes Type 2       Timeframe:  Long-Range Goal Priority:  High Start Date:    06/23/20                         Expected End Date:    12/27/20                   Follow up visit: 10/30/20   - check blood sugar at prescribed times - take the blood sugar log to all doctor visits    Why is this important?   Checking your blood sugar at home helps to keep it from getting very high or very low.  Writing the results in a diary or log helps the doctor know how to care for you.  Your blood sugar log should have the time, date and the results.  Also, write down the amount of insulin or other medicine that you take.  Other information, like what you ate, exercise done and how you were feeling, will also be helpful.    Notes: 09/03/2020: Patient checking blood sugars 3 times daily (before breakfast, lunch and dinner).  Blood sugar range mainly between low 100 - 200.  FBS today 110. Patient is having low blood sugars which drop into 50s - 70s in the afternoon.  Yesterday's reading at 4 pm was 50. Patient is symptomatic: feels "tired and weak". RNCM reviewed with patient signs & symptoms of hypoglycemia and what to do when hypoglycemia episodes occurs.  Patient verbalized understanding.  Patient is open to making follow up PCP visit in near future. Patient continues to be compliant with medications.         The patient verbalized understanding of instructions, educational materials, and care plan provided today and declined offer to receive copy of patient instructions, educational materials, and care plan.   Follow up Plan: Patient would like continued follow-up.  CCM RNCM will outreach the patient within the next 5 weeks  Patient will call office if needed  prior to next encounter  Juanell Fairly, RN  [phone]

## 2020-09-30 ENCOUNTER — Telehealth: Payer: Self-pay

## 2020-09-30 NOTE — Telephone Encounter (Signed)
   Telephone encounter was:  Successful.  09/30/2020 Name: Jordan Rodriguez MRN: [id number] DOB: 04/26/1950  Jordan Rodriguez is a 70 y.o. year old male who is a primary care patient of Sabino Dick, DO . The community resource team was consulted for assistance with  utility assistance.  Care guide performed the following interventions: Spoke with patient he stated he needs assistance with his electric and water bill.  I verified patient's email [email or web address].  I will email resources within the next 7 days.  Follow Up Plan:  Care guide will follow up with patient by phone over the next 7 days.  Lamiyah Schlotter, AAS Paralegal, [facility] Care Guide  Embedded Care Coordination [place]  Care Management  [address] [place], [place] [address] ??[email or web address]  ?? [phone]   [email or web address]

## 2020-10-02 ENCOUNTER — Ambulatory Visit: Payer: Self-pay

## 2020-10-02 NOTE — Chronic Care Management (AMB) (Signed)
   RN Case Manager Care Management   Phone Outreach    10/02/2020 Name: [name] MRN: [id number] DOB: [birth year]  Jordan Rodriguez is a 70 y.o. year old male who is a primary care patient of Sabino Dick, DO .   CCM RNCM received a call today from the patient he had not received the paperwork for financial hardship yet in his Email.  We reviewed the  Email given and he said that it was correct.  Advised him that the care guide stated that she would send the information to him within the next 7 days and follow up with him to make sure he had received it.  If he has not received any follow up after the 7 days give me a call back  and I will look into it for him.  Review of patient status, including review of consultants reports, relevant laboratory and other test results, and collaboration with appropriate care team members and the patient's provider was performed as part of comprehensive patient evaluation and provision of care management services.    Juanell Fairly RN, BSN, [facility] Care Management Coordinator [facility] Phone: [phone] I Fax: [phone]

## 2020-10-07 ENCOUNTER — Telehealth: Payer: Self-pay

## 2020-10-07 NOTE — Telephone Encounter (Signed)
   Telephone encounter was:  Successful.  10/07/2020 Name: Jordan Rodriguez MRN: [id number] DOB: April 05, 1950  Jordan Rodriguez is a 70 y.o. year old male who is a primary care patient of Sabino Dick, DO . The community resource team was consulted for assistance with  utilities.  Care guide performed the following interventions:  Spoke with patient he has not checked his email yet but will check it and give me a call back. Verified email and resent.   Follow Up Plan:  Care guide will follow up with patient by phone over the next 7 days.  Jordan Rodriguez, AAS Paralegal, [facility] Care Guide  Embedded Care Coordination [place]  Care Management  [address] [place], [place] [address] ??[email or web address]  ?? [phone]   [email or web address]

## 2020-10-08 ENCOUNTER — Ambulatory Visit: Payer: Medicaid Other

## 2020-10-08 NOTE — Chronic Care Management (AMB) (Signed)
   RN Case Manager Care Management   Phone Outreach    10/08/2020 Name: PARNELL SPIELER MRN: [id number] DOB: 11-Jun-1950  Jordan Rodriguez is a 70 y.o. year old male who is a primary care patient of Sabino Dick, DO .   CCM RNCM received a call today from the patient stating that he still hasn't received the financial forms he needs to fill out for help with his bills.  RNCM spoke with Millie ( care guide)  and she states that she has emailed them to him 3 times and confirmed the e-mail, but she will put them in the mail to him since he is having trouble.  RNCM called the patient back and informed him that Aldean Jewett will be putting the information in the mail for him to receive.  The patient verbalized understanding.  Plan: RNCM will converse with the patient at the next scheduled interval.  Review of patient status, including review of consultants reports, relevant laboratory and other test results, and collaboration with appropriate care team members and the patient's provider was performed as part of comprehensive patient evaluation and provision of care management services.    Juanell Fairly RN, BSN, [facility] Care Management Coordinator [facility] Phone: [phone] I Fax: [phone]

## 2020-10-13 ENCOUNTER — Telehealth: Payer: Self-pay

## 2020-10-13 NOTE — Telephone Encounter (Signed)
   Telephone encounter was:  Successful.  10/13/2020 Name: GUILLERMO NEHRING MRN: [id number] DOB: 24-Mar-1950  OSAMU OLGUIN is a 70 y.o. year old male who is a primary care patient of Sabino Dick, DO . The community resource team was consulted for assistance with  utilities.  Care guide performed the following interventions: Spoke with patient he has received emailed resources for utility assistance. Patient should also receive mailed letter this week as well.    Follow Up Plan:  No further follow up planned at this time. The patient has been provided with needed resources.  Rendell Thivierge, AAS Paralegal, [facility] Care Guide  Embedded Care Coordination [place]  Care Management  [address] [place], [place] [address] ??[email or web address]  ?? [phone]   [email or web address]

## 2020-10-25 NOTE — Progress Notes (Signed)
    SUBJECTIVE:   CHIEF COMPLAINT / HPI:   Diabetes, Type 2 - Last A1c 10.8 in April 2022 - Medications: Metformin 1500 mg daily, Lantus 12U, Jardiance 25 mg - Compliance: Good - Checking BG at home: Checks 3x daily - Diet: Still eats some fruits and sweets. Tries to cook. - Foot Exam: Due for this. - Exercise: Walks a little, not much. Takes the newspaper to his neighbors house and walks to his sisters house (a couple blocks away).  - Eye exam: Due for it, states he cannot afford. - ACE-I: On Losartan 25 mg daily  - Statin: On Lipitor 40 mg daily. Lipid panel checked in April 2022, LDL 38.  - Denies symptoms of hypoglycemia,  polydipsia, numbness extremities, foot ulcers/trauma  PERTINENT  PMH / PSH: CKD stage IIIb, HLD, Erectile dysfunction   OBJECTIVE:   BP (!) 122/58   Pulse 67   Ht 5\' 5"  (1.651 m)   Wt 124 lb 9.6 oz (56.5 kg)   SpO2 100%   BMI 20.73 kg/m    General: NAD, pleasant, able to participate in exam Respiratory: Breathing comfortably on room air, no respiratory distress Feet: warm and dry without ulcerations. Toe nails intact without thickness. No ulcers noted. Normal sensation throughout. 2+ DP and PT pulses b/l. Normal AROM. Skin: warm and dry, no rashes noted Neuro: alert, no obvious focal deficits Psych: Normal affect and mood  ASSESSMENT/PLAN:   Controlled diabetes mellitus type 2 with complications (HCC) A1c is much improved today at 7.8, down from 10.8 in April.  Congratulated patient on this.  Reviewed his glucometer readings which show a 30-day average of 116 and a 90-day average of 114.  Patient had some asymptomatic hypoglycemic readings around 60-70's. Although these were asymptomatic and infrequent, advised patient to decrease Lantus by 2U. If he continues to have readings below 80, I instructed him to continue to cut down by 2U each time. Diabetic foot exam was done today and without any abnormalities.  Patient is due for diabetic eye exam though  has difficulty financially affording this.  Was amenable to referral for CCM for financial assistance.  CKD (chronic kidney disease) stage 3, GFR 30-59 ml/min Follows with nephrology.   -Checking BMP today     May, DO [facility]

## 2020-10-25 NOTE — Patient Instructions (Addendum)
It was wonderful to see you today.  Please bring ALL of your medications with you to every visit.   Today we talked about:  -Your Hemoglobin A1c is 7.8, which is 3 points lower than your last! Congratulations! Keep up the great work.  -We are also going to check blood work to look at your electrolytes and kidney function. -Decrease your Lantus to 10 units each morning. If you are still having low blood sugars (anything less than 80), decrease your Lantus again by 2U.  -Come back in 3 months for another check. -I am sending you a referral to our chronic care management team so that they can look into financial assistance for you since you are due for a diabetic eye exam.   Thank you for choosing [facility].   Please call [phone] with any questions about today's appointment.  Please be sure to schedule follow up at the front  desk before you leave today.   Sabino Dick, DO PGY-2 Family Medicine

## 2020-10-27 ENCOUNTER — Encounter: Payer: Self-pay | Admitting: Family Medicine

## 2020-10-27 ENCOUNTER — Other Ambulatory Visit: Payer: Self-pay

## 2020-10-27 ENCOUNTER — Ambulatory Visit (INDEPENDENT_AMBULATORY_CARE_PROVIDER_SITE_OTHER): Payer: Medicare Other | Admitting: Family Medicine

## 2020-10-27 VITALS — BP 122/58 | HR 67 | Ht 65.0 in | Wt 124.6 lb

## 2020-10-27 DIAGNOSIS — Z794 Long term (current) use of insulin: Secondary | ICD-10-CM | POA: Diagnosis not present

## 2020-10-27 DIAGNOSIS — N1832 Chronic kidney disease, stage 3b: Secondary | ICD-10-CM | POA: Diagnosis not present

## 2020-10-27 DIAGNOSIS — E118 Type 2 diabetes mellitus with unspecified complications: Secondary | ICD-10-CM | POA: Diagnosis not present

## 2020-10-27 DIAGNOSIS — E1165 Type 2 diabetes mellitus with hyperglycemia: Secondary | ICD-10-CM

## 2020-10-27 DIAGNOSIS — Z599 Problem related to housing and economic circumstances, unspecified: Secondary | ICD-10-CM

## 2020-10-27 LAB — POCT GLYCOSYLATED HEMOGLOBIN (HGB A1C): HbA1c, POC (controlled diabetic range): 7.8 % — AB (ref 0.0–7.0)

## 2020-10-27 NOTE — Assessment & Plan Note (Signed)
A1c is much improved today at 7.8, down from 10.8 in April.  Congratulated patient on this.  Reviewed his glucometer readings which show a 30-day average of 116 and a 90-day average of 114.  Patient had some asymptomatic hypoglycemic readings around 60-70's. Although these were asymptomatic and infrequent, advised patient to decrease Lantus by 2U. If he continues to have readings below 80, I instructed him to continue to cut down by 2U each time. Diabetic foot exam was done today and without any abnormalities.  Patient is due for diabetic eye exam though has difficulty financially affording this.  Was amenable to referral for CCM for financial assistance.

## 2020-10-27 NOTE — Assessment & Plan Note (Signed)
Follows with nephrology.   -Checking BMP today

## 2020-10-28 ENCOUNTER — Other Ambulatory Visit: Payer: Self-pay | Admitting: Family Medicine

## 2020-10-28 ENCOUNTER — Encounter: Payer: Self-pay | Admitting: Family Medicine

## 2020-10-28 LAB — BASIC METABOLIC PANEL
BUN/Creatinine Ratio: 13 (ref 10–24)
BUN: 26 mg/dL (ref 8–27)
CO2: 20 mmol/L (ref 20–29)
Calcium: 8.6 mg/dL (ref 8.6–10.2)
Chloride: 103 mmol/L (ref 96–106)
Creatinine, Ser: 1.95 mg/dL — ABNORMAL HIGH (ref 0.76–1.27)
Glucose: 95 mg/dL (ref 65–99)
Potassium: 4.4 mmol/L (ref 3.5–5.2)
Sodium: 138 mmol/L (ref 134–144)
eGFR: 37 mL/min/{1.73_m2} — ABNORMAL LOW (ref >59)

## 2020-10-28 NOTE — Progress Notes (Signed)
Letter mailed to patient regarding recent BMP (stable from previous). Also included information for an annual eye exam for his diabetes. He should call to schedule an appointment.

## 2020-10-30 ENCOUNTER — Ambulatory Visit: Payer: Medicaid Other

## 2020-10-30 NOTE — Patient Instructions (Signed)
Visit Information  Mr. Chaves  it was nice speaking with you. Please call me directly [phone] if you have questions about the goals we discussed.   Goals Addressed             This Visit's Progress    Monitor and Manage My Blood Sugar-Diabetes Type 2       Timeframe:  Long-Range Goal Priority:  High Start Date:    06/23/20                         Expected End Date:    12/28/20    - check blood sugar at prescribed times - take the blood sugar log to all doctor visits    Why is this important?   Checking your blood sugar at home helps to keep it from getting very high or very low.  Writing the results in a diary or log helps the doctor know how to care for you.  Your blood sugar log should have the time, date and the results.  Also, write down the amount of insulin or other medicine that you take.  Other information, like what you ate, exercise done and how you were feeling, will also be helpful.    Notes:             The patient verbalizes understanding of the information and instructions discussed today.  Our next appointment is scheduled for  12/01/20.   Please feel free to call me or the office if you have any questions or concerns.  Juanell Fairly RN, BSN, [facility] Care Management Coordinator [facility] Phone: [phone] I Fax: [phone]

## 2020-10-30 NOTE — Chronic Care Management (AMB) (Signed)
Chronic Care Management   CCM RN Visit Note  10/30/2020 Name: Jordan Rodriguez MRN: [id number] DOB: 09-13-1950  Subjective: Jordan Rodriguez is a 70 y.o. year old male who is a primary care patient of Jordan Settler, DO. The care management team was consulted for assistance with disease management and care coordination needs.    Engaged with patient by telephone for follow up visit in response to provider referral for case management and/or care coordination services.   Consent to Services:  The patient was given information about Chronic Care Management services, agreed to services, and gave verbal consent prior to initiation of services.  Please see initial visit note for detailed documentation.   Patient agreed to services and verbal consent obtained.    Assessment:  The patient is making progress with blood sugars, but still needs to remember to eat three healthy meals a day and the correct foods along with taking his medication . See Care Plan below for interventions and patient self-care actives. Follow up Plan: Patient would like continued follow-up.  CCM RNCM will outreach the patient within the next 5 weeks.  Patient will call office if needed prior to next encounter  Review of patient past medical history, allergies, medications, health status, including review of consultants reports, laboratory and other test data, was performed as part of comprehensive evaluation and provision of chronic care management services.   SDOH (Social Determinants of Health) assessments and interventions performed:  No  CCM Care Plan  No Known Allergies  Outpatient Encounter Medications as of 10/30/2020  Medication Sig Note   Accu-Chek Softclix Lancets lancets SMARTSIG:1 Topical 4 Times Daily    Accu-Chek Softclix Lancets lancets Use as instructed    aspirin EC 81 MG tablet Take 1 tablet (81 mg total) by mouth daily.    atorvastatin (LIPITOR) 40 MG tablet Take 1 tablet (40 mg total) by mouth  daily.    blood glucose meter kit and supplies KIT Dispense based on patient and insurance preference. Use up to four times daily as directed. (FOR ICD-9 250.00, 250.01).    empagliflozin (JARDIANCE) 25 MG TABS tablet Take 1 tablet (25 mg total) by mouth daily.    glucose blood (ACCU-CHEK AVIVA) test strip Use to check blood sugar 3x a day. Dx Code: E11.8    glucose blood (BAYER CONTOUR TEST) test strip Use to test blood sugar up to 4 times a day.  ICD-10 code E11.65. (Patient taking differently: Use to test blood sugar up to 4 times a day.  ICD-10 code E11.65.)    insulin glargine (LANTUS) 100 UNIT/ML Solostar Pen Inject 10 Units into the skin every morning. (Patient taking differently: Inject 12 Units into the skin every morning.) 06/23/2020: Patient 15 units in the morning.   Insulin Pen Needle 32G X 4 MM MISC 1 Container by Does not apply route 3 (three) times daily.    Lancet Devices (ACCU-CHEK SOFTCLIX) lancets Check blood glucose up to 3 times daily.    losartan (COZAAR) 25 MG tablet Take 25 mg by mouth at bedtime.    metFORMIN (GLUCOPHAGE) 500 MG tablet TAKE 3 TABLETS BY MOUTH ONCE DAILY WITH BREAKFAST    sildenafil (VIAGRA) 50 MG tablet Take 1 tablet (50 mg total) by mouth daily as needed for erectile dysfunction.    sodium bicarbonate 650 MG tablet Take 650 mg by mouth 2 (two) times daily.    No facility-administered encounter medications on file as of 10/30/2020.    Patient Active Problem List  Diagnosis Date Noted   Hyperkalemia 05/25/2020   Muscle cramps 11/11/2019   Erectile dysfunction 11/11/2019   Advance directive discussed with patient 07/15/2019   CKD (chronic kidney disease) stage 3, GFR 30-59 ml/min ([place]) 06/26/2019   Hyperlipidemia 12/24/2015   Diverticulosis of colon 10/29/2013   Controlled diabetes mellitus type 2 with complications ([place]) 09/73/5329    Conditions to be addressed/monitored:DMII  Care Plan : RN CAse Manager  Updates made by Lazaro Arms, RN since  10/30/2020 12:00 AM     Problem: Glycemic Management (Diabetes, Type 2)      Long-Range Goal: Patient will monitor and manage diabetes with medication and food intake.   Start Date: 06/23/2020  Expected End Date: 12/27/2020  Recent Progress: On track  Priority: High  Note:   Objective:  Lab Results  Component Value Date   HGBA1C 7.8 (A) 10/27/2020    Lab Results  Component Value Date   CREATININE 1.95 (H) 10/27/2020   CREATININE 1.96 (H) 06/24/2020   CREATININE 1.88 (H) 06/08/2020   Lab Results  Component Value Date   EGFR 37 (L) 10/27/2020    Current Barriers:  Knowledge Deficits related to basic Diabetes pathophysiology and self care/management  Case Manager Clinical Goal(s):  Over the next 90 days, patient will demonstrate improved adherence to prescribed treatment plan for diabetes self care/management as evidenced by: lowering his a1c by 1-2 points.  Interventions:  Provided education to patient about basic DM disease process Reviewed medications with patient and discussed importance of medication adherence.   Patient continues to be compliant with medications. Discussed plans with patient for ongoing care management follow up and provided patient with direct contact information for care management team Will  mail the  patient written educational materials related to hypo and hyperglycemia and importance of correct treatment. Patient received the educational material and is reviewing it.  Review of patient status, including review of consultants reports, relevant laboratory and other test results, and medications completed. Has your doctor given you an a1c target? Yes 7  or below What type of glucometer do you use? Accu check Have you received diabetes education in the past? Yews he states that it has been in the last 2 years. Reviewed the hypoglycemic protocol.  Patient verbalized understanding.   - blood glucose monitoring encouraged 09/03/2020: Patient continues to  check blood sugars 3 times per day. - blood glucose readings reviewed.  10/30/20: Spoke with the patient today and he states that he is doing fine.  He checked his blood sugar this morning it was 81.  At noon it was 223 he said that he had eaten some cereal but he had not taken his medication.   We discussed his diet and taking his medications.  We discussed hypoglycemia and what he needs to do.  I also congratulated him on lower his a1c. He let me know that he did receive the information from the care guide regarding help for his bills.  He stated that he was going to call the company today about his electric bill.  - healthy lifestyle promoted Patient expressed that he has some financial concerns and RNCM will send a Referral to the care guides to help.  Patient Goals/Self Care Activities:  Patient verbalizes understanding of plan Self-administers medications as prescribed Calls pharmacy for medication refills Call's provider office for new concerns or questions check blood sugar at prescribed times      Lazaro Arms RN, BSN, [place]  Phone: [phone] I Fax: [phone]

## 2020-11-04 ENCOUNTER — Ambulatory Visit (INDEPENDENT_AMBULATORY_CARE_PROVIDER_SITE_OTHER): Payer: Medicare Other

## 2020-11-04 DIAGNOSIS — Z0000 Encounter for general adult medical examination without abnormal findings: Secondary | ICD-10-CM | POA: Diagnosis not present

## 2020-11-04 NOTE — Patient Instructions (Addendum)
You spoke to Steva Colder, CMA over the phone for your annual wellness visit.  We discussed goals:   Goals       Acknowledge receipt of Advanced Directive package     Patient reports he does have the packet, however he did not understand how to fill it out.  Discussed with patient.      complete Advance Directives     CARE PLAN ENTRY (see longitudinal plan of care for additional care plan information)  Current Barriers:  Patient does not have an Advance Directive Acknowledges deficits, education and support in order to complete this document Clinical Social Work Goal(s):  Over the next 45 days, patient will review mailed EMMI education on Advance Directive as evidenced by patient self report of review Over the next 45 days, the patient will  completion of Advance Directive, notarize and provide a copy to provider office Interventions provided by LCSW: A voluntary discussion about advanced care planning including importance of advanced directives, healthcare proxy and living will was discussed with the patient.  E-Mailed the patient an EMMI educational information on Advance Directives as well as reviewed Advance Directive packet he received from the [facility] office Patient Self Care Activities:  Is able to complete documentation with assistance of his daughter Able to identify next of kin or Health Care Power of Attorney/Health Care Agent Patient will review EMMI education and call LCSW with questions Initial goal documentation     Exercise 3-4x per week (30 min per time)     HEMOGLOBIN A1C < 7.0     Monitor and Manage My Blood Sugar-Diabetes Type 2     Timeframe:  Long-Range Goal Priority:  High Start Date:    06/23/20                         Expected End Date:    12/28/20    - check blood sugar at prescribed times - take the blood sugar log to all doctor visits    Why is this important?   Checking your blood sugar at home helps to keep it from getting very high or very low.   Writing the results in a diary or log helps the doctor know how to care for you.  Your blood sugar log should have the time, date and the results.  Also, write down the amount of insulin or other medicine that you take.  Other information, like what you ate, exercise done and how you were feeling, will also be helpful.    Notes:        We also discussed recommended health maintenance. Please call our office and schedule a visit, you are due January 2023. As discussed, you are due for:  Health Maintenance  Topic Date Due   TETANUS/TDAP  Never done   Zoster Vaccines- Shingrix (1 of 2) Never done   PNA vac Low Risk Adult (2 of 2 - PPSV23) 11/13/2019   COVID-19 Vaccine (3 - Booster for Janssen series) 07/26/2020   INFLUENZA VACCINE  09/28/2020   HEMOGLOBIN A1C  04/27/2021   FOOT EXAM  05/22/2021   OPHTHALMOLOGY EXAM  06/28/2021   COLONOSCOPY (Pts 45-11yrs Insurance coverage will need to be confirmed)  10/15/2023   Hepatitis C Screening  Completed   HPV VACCINES  Aged Out   Due to [employer] and Flu vaccines. Please call and schedule a nurse visit for both.  PCP apt due in January. Fill out advance directive and  [facility].  Health Maintenance, Male Adopting a healthy lifestyle and getting preventive care are important in promoting health and wellness. Ask your health care provider about: The right schedule for you to have regular tests and exams. Things you can do on your own to prevent diseases and keep yourself healthy. What should I know about diet, weight, and exercise? Eat a healthy diet  Eat a diet that includes plenty of vegetables, fruits, low-fat dairy products, and lean protein. Do not eat a lot of foods that are high in solid fats, added sugars, or sodium. Maintain a healthy weight Body mass index (BMI) is a measurement that can be used to identify possible weight problems. It estimates body fat based on height and weight. Your health care provider can help  determine your BMI and help you achieve or maintain a healthy weight. Get regular exercise Get regular exercise. This is one of the most important things you can do for your health. Most adults should: Exercise for at least 150 minutes each week. The exercise should increase your heart rate and make you sweat (moderate-intensity exercise). Do strengthening exercises at least twice a week. This is in addition to the moderate-intensity exercise. Spend less time sitting. Even light physical activity can be beneficial. Watch cholesterol and blood lipids Have your blood tested for lipids and cholesterol at 69 years of age, then have this test every 5 years. You may need to have your cholesterol levels checked more often if: Your lipid or cholesterol levels are high. You are older than 69 years of age. You are at high risk for heart disease. What should I know about cancer screening? Many types of cancers can be detected early and may often be prevented. Depending on your health history and family history, you may need to have cancer screening at various ages. This may include screening for: Colorectal cancer. Prostate cancer. Skin cancer. Lung cancer. What should I know about heart disease, diabetes, and high blood pressure? Blood pressure and heart disease High blood pressure causes heart disease and increases the risk of stroke. This is more likely to develop in people who have high blood pressure readings, are of African descent, or are overweight. Talk with your health care provider about your target blood pressure readings. Have your blood pressure checked: Every 3-5 years if you are 2718-69 years of age. Every year if you are 69 years old or older. If you are between the ages of 5965 and 4175 and are a current or former smoker, ask your health care provider if you should have a one-time screening for abdominal aortic aneurysm (AAA). Diabetes Have regular diabetes screenings. This checks your  fasting blood sugar level. Have the screening done: Once every three years after age 69 if you are at a normal weight and have a low risk for diabetes. More often and at a younger age if you are overweight or have a high risk for diabetes. What should I know about preventing infection? Hepatitis B If you have a higher risk for hepatitis B, you should be screened for this virus. Talk with your health care provider to find out if you are at risk for hepatitis B infection. Hepatitis C Blood testing is recommended for: Everyone born from [id number] through 1964. Anyone with known risk factors for hepatitis C. Sexually transmitted infections (STIs) You should be screened each year for STIs, including gonorrhea and chlamydia, if: You are sexually active and are younger than 69 years of age.  You are older than 69 years of age and your health care provider tells you that you are at risk for this type of infection. Your sexual activity has changed since you were last screened, and you are at increased risk for chlamydia or gonorrhea. Ask your health care provider if you are at risk. Ask your health care provider about whether you are at high risk for HIV. Your health care provider may recommend a prescription medicine to help prevent HIV infection. If you choose to take medicine to prevent HIV, you should first get tested for HIV. You should then be tested every 3 months for as long as you are taking the medicine. Follow these instructions at home: Lifestyle Do not use any products that contain nicotine or tobacco, such as cigarettes, e-cigarettes, and chewing tobacco. If you need help quitting, ask your health care provider. Do not use street drugs. Do not share needles. Ask your health care provider for help if you need support or information about quitting drugs. Alcohol use Do not drink alcohol if your health care provider tells you not to drink. If you drink alcohol: Limit how much you have to 0-2  drinks a day. Be aware of how much alcohol is in your drink. In the [place] one drink equals one 12 oz bottle of beer (355 mL), one 5 oz glass of wine (148 mL), or one 1 oz glass of hard liquor (44 mL). General instructions Schedule regular health, dental, and eye exams. Stay current with your vaccines. Tell your health care provider if: You often feel depressed. You have ever been abused or do not feel safe at home. Summary Adopting a healthy lifestyle and getting preventive care are important in promoting health and wellness. Follow your health care provider's instructions about healthy diet, exercising, and getting tested or screened for diseases. Follow your health care provider's instructions on monitoring your cholesterol and blood pressure. This information is not intended to replace advice given to you by your health care provider. Make sure you discuss any questions you have with your health care provider. Document Revised: 04/24/2020 Document Reviewed: 02/07/2018 Elsevier Patient Education  2022 Elsevier Inc.   Diet Recommendations for Diabetes   1. Eat at least 3 meals and 1-2 snacks per day. Never go more than 4-5 hours while awake without eating. Eat breakfast within the first hour of getting up.   2. Limit starchy foods to TWO per meal and ONE per snack. ONE portion of a starchy  food is equal to the following:   - ONE slice of bread (or its equivalent, such as half of a hamburger bun).   - 1/2 cup of a "scoopable" starchy food such as potatoes or rice.   - 15 grams of Total Carbohydrate as shown on food label.  3. Include at every meal: a protein food, a carb food, and vegetables and/or fruit.   - Obtain twice the volume of vegetables as protein or carbohydrate foods for both lunch and dinner.   - Fresh or frozen vegetables are best.   - Keep frozen vegetables on hand for a quick vegetable serving.       Starchy (carb) foods: Bread, rice, pasta, potatoes, corn, cereal,  grits, crackers, bagels, muffins, all baked goods.  (Fruits, milk, and yogurt also have carbohydrate, but most of these foods will not spike your blood sugar as most starchy foods will.)  A few fruits do cause high blood sugars; use small portions of bananas (limit to  1/2 at a time), grapes, watermelon, oranges, and most tropical fruits.    Protein foods: Meat, fish, poultry, eggs, dairy foods, and beans such as pinto and kidney beans (beans also provide carbohydrate).        Our clinic's number is [phone]. Please call with questions or concerns about what we discussed today.

## 2020-11-04 NOTE — Progress Notes (Signed)
I have reviewed this visit and agree with the documentation.   

## 2020-11-04 NOTE — Progress Notes (Signed)
Subjective:   Jordan Rodriguez is a 70 y.o. male who presents for Medicare Annual/Subsequent preventive examination.  The patient consented to a virtual visit. Patient consented to have virtual visit and was identified by name and date of birth. Method of visit: Telephone  Encounter participants: Patient: Jordan Rodriguez - located at Home Nurse/Provider: Dorna Bloom - located at [facility] Others (if applicable): NA  Review of Systems: Defer to PCP.  Cardiac Risk Factors include: advanced age (>73mn, >>19women);diabetes mellitus;hypertension;male gender  Objective:    Vitals: There were no vitals taken for this visit.  There is no height or weight on file to calculate BMI.  Advanced Directives 11/04/2020 06/08/2020 05/25/2020 05/22/2020 12/11/2019 09/09/2019 12/18/2018  Does Patient Have a Medical Advance Directive? No No No No No No No  Would patient like information on creating a medical advance directive? Yes (MAU/Ambulatory/Procedural Areas - Information given) No - Patient declined No - Patient declined No - Patient declined No - Patient declined No - Patient declined No - Patient declined  Pre-existing out of facility DNR order (yellow form or pink MOST form) - - - - - - -   Tobacco Social History   Tobacco Use  Smoking Status Former   Types: Cigarettes   Quit date: 02/28/1993   Years since quitting: 27.7  Smokeless Tobacco Never     Counseling given: No plans to restart.  Clinical Intake:  Pre-visit preparation completed: Yes  Pain Score: 0-No pain  How often do you need to have someone help you when you read instructions, pamphlets, or other written materials from your doctor or pharmacy?: 2 - Rarely What is the last grade level you completed in school?: High School  Past Medical History:  Diagnosis Date   Diabetes mellitus without complication ([place]    Substance abuse ([place]    hx alcohol (quit 1982) and tobacco (quit 1995)   Past Surgical History:  Procedure  Laterality Date   COLONOSCOPY  2015   NASAL FRACTURE SURGERY     Family History  Problem Relation Age of Onset   Diabetes Mother    Hypertension Mother    Stroke Mother    Early death Father    Alcohol abuse Father    Diabetes Father    Drug abuse Sister    Cancer Paternal Grandfather    Diabetes Sister    Kidney disease Sister    Kidney disease Sister    Thyroid disease Sister    Hypertension Sister    Colon cancer Neg Hx    Social History   Socioeconomic History   Marital status: Single    Spouse name: Not on file   Number of children: 3   Years of education: 12   Highest education level: High school graduate  Occupational History    Comment: AScientist, research (physical sciences)   Occupation: retired  Tobacco Use   Smoking status: Former    Types: Cigarettes    Quit date: 02/28/1993    Years since quitting: 27.7   Smokeless tobacco: Never  Vaping Use   Vaping Use: Never used  Substance and Sexual Activity   Alcohol use: No    Comment: quit 1982   Drug use: Yes    Types: Marijuana    Comment: quit 1995   Sexual activity: Not Currently  Other Topics Concern   Not on file  Social History Narrative   Patient lives alone. He does drive himself. Patient is retired.   He does have  close relationships with his sister, who he drives around to run errands and medical apts. Patient is close with his children, he sees them often. However, one of his sons is in prison.    Patient enjoys playing cards and bingo.    Social Determinants of Health   Financial Resource Strain: Low Risk    Difficulty of Paying Living Expenses: Not very hard  Food Insecurity: No Food Insecurity   Worried About Charity fundraiser in the Last Year: Never true   Ran Out of Food in the Last Year: Never true  Transportation Needs: No Transportation Needs   Lack of Transportation (Medical): No   Lack of Transportation (Non-Medical): No  Physical Activity: Inactive   Days of Exercise per Week: 0 days   Minutes of  Exercise per Session: 0 min  Stress: No Stress Concern Present   Feeling of Stress : Not at all  Social Connections: Moderately Isolated   Frequency of Communication with Friends and Family: More than three times a week   Frequency of Social Gatherings with Friends and Family: More than three times a week   Attends Religious Services: More than 4 times per year   Active Member of [employer] or Organizations: No   Attends Archivist Meetings: Never   Marital Status: Never married   Outpatient Encounter Medications as of 11/04/2020  Medication Sig   Accu-Chek Softclix Lancets lancets SMARTSIG:1 Topical 4 Times Daily   Accu-Chek Softclix Lancets lancets Use as instructed   aspirin EC 81 MG tablet Take 1 tablet (81 mg total) by mouth daily.   atorvastatin (LIPITOR) 40 MG tablet Take 1 tablet (40 mg total) by mouth daily.   blood glucose meter kit and supplies KIT Dispense based on patient and insurance preference. Use up to four times daily as directed. (FOR ICD-9 250.00, 250.01).   empagliflozin (JARDIANCE) 25 MG TABS tablet Take 1 tablet (25 mg total) by mouth daily.   glucose blood (ACCU-CHEK AVIVA) test strip Use to check blood sugar 3x a day. Dx Code: E11.8   glucose blood (BAYER CONTOUR TEST) test strip Use to test blood sugar up to 4 times a day.  ICD-10 code E11.65. (Patient taking differently: Use to test blood sugar up to 4 times a day.  ICD-10 code E11.65.)   insulin glargine (LANTUS) 100 UNIT/ML Solostar Pen Inject 10 Units into the skin every morning. (Patient taking differently: Inject 12 Units into the skin every morning.)   Insulin Pen Needle 32G X 4 MM MISC 1 Container by Does not apply route 3 (three) times daily.   Lancet Devices (ACCU-CHEK SOFTCLIX) lancets Check blood glucose up to 3 times daily.   losartan (COZAAR) 25 MG tablet Take 25 mg by mouth at bedtime.   metFORMIN (GLUCOPHAGE) 500 MG tablet TAKE 3 TABLETS BY MOUTH ONCE DAILY WITH BREAKFAST   sildenafil (VIAGRA)  50 MG tablet Take 1 tablet (50 mg total) by mouth daily as needed for erectile dysfunction.   sodium bicarbonate 650 MG tablet Take 650 mg by mouth 2 (two) times daily.   No facility-administered encounter medications on file as of 11/04/2020.   Activities of Daily Living In your present state of health, do you have any difficulty performing the following activities: 11/04/2020  Hearing? N  Vision? N  Difficulty concentrating or making decisions? N  Walking or climbing stairs? N  Dressing or bathing? N  Doing errands, shopping? N  Preparing Food and eating ? N  Using the  Toilet? N  In the past six months, have you accidently leaked urine? Y  Do you have problems with loss of bowel control? N  Managing your Medications? N  Managing your Finances? N  Housekeeping or managing your Housekeeping? N  Some recent data might be hidden   Patient Care Team: Sharion Settler, DO as PCP - General (Family Medicine) Lazaro Arms, RN as Case Manager   Assessment:   This is a routine wellness examination for Jordan Rodriguez.  Exercise Activities and Dietary recommendations Current Exercise Habits: The patient does not participate in regular exercise at present, Exercise limited by: None identified   Goals       Acknowledge receipt of Advanced Directive package     Patient reports he does have the packet, however he did not understand how to fill it out.  Discussed with patient.      complete Advance Directives     CARE PLAN ENTRY (see longitudinal plan of care for additional care plan information)  Current Barriers:  Patient does not have an Advance Directive Acknowledges deficits, education and support in order to complete this document Clinical Social Work Goal(s):  Over the next 45 days, patient will review mailed EMMI education on Advance Directive as evidenced by patient self report of review Over the next 45 days, the patient will  completion of Advance Directive, notarize and provide a  copy to provider office Interventions provided by LCSW: A voluntary discussion about advanced care planning including importance of advanced directives, healthcare proxy and living will was discussed with the patient.  E-Mailed the patient an EMMI educational information on Advance Directives as well as reviewed Advance Directive packet he received from the [facility] office Patient Self Care Activities:  Is able to complete documentation with assistance of his daughter Able to identify next of kin or [place] of Attorney/Health Care Agent Patient will review EMMI education and call LCSW with questions Initial goal documentation     Exercise 3-4x per week (30 min per time)     HEMOGLOBIN A1C < 7.0     Monitor and Manage My Blood Sugar-Diabetes Type 2     Timeframe:  Long-Range Goal Priority:  High Start Date:    06/23/20                         Expected End Date:    12/28/20    - check blood sugar at prescribed times - take the blood sugar log to all doctor visits    Why is this important?   Checking your blood sugar at home helps to keep it from getting very high or very low.  Writing the results in a diary or log helps the doctor know how to care for you.  Your blood sugar log should have the time, date and the results.  Also, write down the amount of insulin or other medicine that you take.  Other information, like what you ate, exercise done and how you were feeling, will also be helpful.    Notes:        Fall Risk Fall Risk  11/04/2020 06/23/2020 06/08/2020 05/25/2020 12/11/2019  Falls in the past year? 0 0 0 0 0  Number falls in past yr: 0 - 0 - 0  Injury with Fall? 1 - 0 - 0  Risk for fall due to : No Fall Risks - - - -  Follow up Falls prevention discussed - - - -  Patient reports his gait is steady and appropriate. Patient does not use assistive devices to ambulate.   Is the patient's home free of loose throw rugs in walkways, pet beds, electrical cords, etc?   yes       Grab bars in the bathroom? yes      Handrails on the stairs?   yes      Adequate lighting?   yes  Patient rating of health (0-10): 8   Depression Screen PHQ 2/9 Scores 11/04/2020 06/08/2020 05/25/2020 12/11/2019  PHQ - 2 Score 0 0 0 0  PHQ- 9 Score - 0 0 0   Cognitive Function  6CIT Screen 11/04/2020 09/03/2018  What Year? 0 points 0 points  What month? 0 points 0 points  What time? 0 points 0 points  Count back from 20 0 points 0 points  Months in reverse 2 points 0 points  Repeat phrase 0 points 0 points  Total Score 2 0   Immunization History  Administered Date(s) Administered   Fluad Quad(high Dose 65+) 12/11/2019   Influenza,inj,Quad PF,6+ Mos 02/17/2015, 11/27/2017, 11/13/2018   Janssen (J&J) SARS-COV-2 Vaccination 09/12/2019   Moderna Sars-Covid-2 Vaccination 03/28/2020   Pneumococcal Conjugate-13 11/13/2018   TDAP status: Due, Education has been provided regarding the importance of this vaccine. Advised may receive this vaccine at local pharmacy or Health Dept. Aware to provide a copy of the vaccination record if obtained from local pharmacy or Health Dept. Verbalized acceptance and understanding.  Qualifies for Shingles Vaccine? Yes   Zostavax completed No   Shingrix Completed?: No.    Education has been provided regarding the importance of this vaccine. Patient has been advised to call insurance company to determine out of pocket expense if they have not yet received this vaccine. Advised may also receive vaccine at local pharmacy or Health Dept. Verbalized acceptance and understanding.   Screening Tests Health Maintenance  Topic Date Due   TETANUS/TDAP  Never done   Zoster Vaccines- Shingrix (1 of 2) Never done   PNA vac Low Risk Adult (2 of 2 - PPSV23) 11/13/2019   COVID-19 Vaccine (3 - Booster for Janssen series) 07/26/2020   INFLUENZA VACCINE  09/28/2020   HEMOGLOBIN A1C  04/27/2021   FOOT EXAM  05/22/2021   OPHTHALMOLOGY EXAM  06/28/2021   COLONOSCOPY (Pts  45-63yr Insurance coverage will need to be confirmed)  10/15/2023   Hepatitis C Screening  Completed   HPV VACCINES  Aged Out   Cancer Screenings: Lung: Low Dose CT Chest recommended if Age 69-80years, 30 pack-year currently smoking OR have quit w/in 15years. Patient does not qualify. Colorectal: UTD  Additional Screenings: Hepatitis C Screening: Completed   Plan:  Due to Covid Booster and Flu vaccines. Please call and schedule a nurse visit for both.  PCP apt due in January. [facility]  I have personally reviewed and noted the following in the patient's chart:   Medical and social history Use of alcohol, tobacco or illicit drugs  Current medications and supplements Functional ability and status Nutritional status Physical activity Advanced directives List of other physicians Hospitalizations, surgeries, and ER visits in previous 12 months Vitals Screenings to include cognitive, depression, and falls Referrals and appointments  In addition, I have reviewed and discussed with patient certain preventive protocols, quality metrics, and best practice recommendations. A written personalized care plan for preventive services as well as general preventive health recommendations were provided to patient.  This visit was  conducted virtually in the setting of the [place] pandemic.   Dorna Bloom, Daniel  11/04/2020

## 2020-11-23 DIAGNOSIS — E119 Type 2 diabetes mellitus without complications: Secondary | ICD-10-CM | POA: Diagnosis not present

## 2020-12-01 ENCOUNTER — Ambulatory Visit: Payer: Medicaid Other

## 2020-12-01 NOTE — Patient Instructions (Signed)
Visit Information  Mr. Rappaport  it was nice speaking with you. Please call me directly [phone] if you have questions about the goals we discussed.   Goals Addressed             This Visit's Progress    Monitor and Manage My Blood Sugar-Diabetes Type 2       Timeframe:  Long-Range Goal Priority:  High Start Date:    06/23/20                         Expected End Date:    12/28/20    - check blood sugar at prescribed times - take the blood sugar log to all doctor visits    Why is this important?   Checking your blood sugar at home helps to keep it from getting very high or very low.  Writing the results in a diary or log helps the doctor know how to care for you.  Your blood sugar log should have the time, date and the results.  Also, write down the amount of insulin or other medicine that you take.  Other information, like what you ate, exercise done and how you were feeling, will also be helpful.           The patient verbalizes understanding of the information and instructions discussed today.  Our next appointment is scheduled for  01/05/21.   Please feel free to call me or the office if you have any questions or concerns.  Juanell Fairly RN, BSN, [facility] Care Management Coordinator [facility] Phone: [phone] I Fax: [phone]

## 2020-12-01 NOTE — Chronic Care Management (AMB) (Signed)
Care Management    RN Visit Note  12/01/2020 Name: Jordan Rodriguez MRN: [id number] DOB: April 29, 1950  Subjective: Jordan Rodriguez is a 70 y.o. year old male who is a primary care patient of Sharion Settler, DO. The care management team was consulted for assistance with disease management and care coordination needs.    Engaged with patient by telephone for follow up visit in response to provider referral for case management and/or care coordination services.   Consent to Services:   Mr. Dunlow was given information about Care Management services today including:  Care Management services includes personalized support from designated clinical staff supervised by his physician, including individualized plan of care and coordination with other care providers 24/7 contact phone numbers for assistance for urgent and routine care needs. The patient may stop case management services at any time by phone call to the office staff.  Patient agreed to services and consent obtained.    Assessment:  The patient is making positive progress with his sugar but still has some elevated blood sugars . See Care Plan below for interventions and patient self-care actives. Follow up Plan: Patient would like continued follow-up.  CCM RNCM will outreach the patient within the next 5 weeks.  Patient will call office if needed prior to next encounter  Review of patient past medical history, allergies, medications, health status, including review of consultants reports, laboratory and other test data, was performed as part of comprehensive evaluation and provision of chronic care management services.   SDOH (Social Determinants of Health) assessments and interventions performed:    Care Plan  No Known Allergies  Outpatient Encounter Medications as of 12/01/2020  Medication Sig Note   Accu-Chek Softclix Lancets lancets SMARTSIG:1 Topical 4 Times Daily    Accu-Chek Softclix Lancets lancets Use as  instructed    aspirin EC 81 MG tablet Take 1 tablet (81 mg total) by mouth daily.    atorvastatin (LIPITOR) 40 MG tablet Take 1 tablet (40 mg total) by mouth daily.    blood glucose meter kit and supplies KIT Dispense based on patient and insurance preference. Use up to four times daily as directed. (FOR ICD-9 250.00, 250.01).    empagliflozin (JARDIANCE) 25 MG TABS tablet Take 1 tablet (25 mg total) by mouth daily.    glucose blood (ACCU-CHEK AVIVA) test strip Use to check blood sugar 3x a day. Dx Code: E11.8    glucose blood (BAYER CONTOUR TEST) test strip Use to test blood sugar up to 4 times a day.  ICD-10 code E11.65. (Patient taking differently: Use to test blood sugar up to 4 times a day.  ICD-10 code E11.65.)    insulin glargine (LANTUS) 100 UNIT/ML Solostar Pen Inject 10 Units into the skin every morning. (Patient taking differently: Inject 12 Units into the skin every morning.) 06/23/2020: Patient 15 units in the morning.   Insulin Pen Needle 32G X 4 MM MISC 1 Container by Does not apply route 3 (three) times daily.    Lancet Devices (ACCU-CHEK SOFTCLIX) lancets Check blood glucose up to 3 times daily.    losartan (COZAAR) 25 MG tablet Take 25 mg by mouth at bedtime.    metFORMIN (GLUCOPHAGE) 500 MG tablet TAKE 3 TABLETS BY MOUTH ONCE DAILY WITH BREAKFAST    sildenafil (VIAGRA) 50 MG tablet Take 1 tablet (50 mg total) by mouth daily as needed for erectile dysfunction.    sodium bicarbonate 650 MG tablet Take 650 mg by mouth 2 (two) times daily.  No facility-administered encounter medications on file as of 12/01/2020.    Patient Active Problem List   Diagnosis Date Noted   Hyperkalemia 05/25/2020   Muscle cramps 11/11/2019   Erectile dysfunction 11/11/2019   Advance directive discussed with patient 07/15/2019   CKD (chronic kidney disease) stage 3, GFR 30-59 ml/min ([place]) 06/26/2019   Hyperlipidemia 12/24/2015   Diverticulosis of colon 10/29/2013   Controlled diabetes mellitus type  2 with complications ([place]) 62/37/6283    Conditions to be addressed/monitored: DMII  Care Plan : RN CAse Manager  Updates made by Lazaro Arms, RN since 12/01/2020 12:00 AM     Problem: Glycemic Management (Diabetes, Type 2)      Long-Range Goal: Patient will monitor and manage diabetes with medication and food intake.   Start Date: 06/23/2020  Expected End Date: 12/27/2020  Recent Progress: On track  Priority: High  Note:   Objective:  Lab Results  Component Value Date   HGBA1C 7.8 (A) 10/27/2020    Lab Results  Component Value Date   CREATININE 1.95 (H) 10/27/2020   CREATININE 1.96 (H) 06/24/2020   CREATININE 1.88 (H) 06/08/2020   Lab Results  Component Value Date   EGFR 37 (L) 10/27/2020    Current Barriers:  Knowledge Deficits related to basic Diabetes pathophysiology and self care/management  Case Manager Clinical Goal(s):  Over the next 90 days, patient will demonstrate improved adherence to prescribed treatment plan for diabetes self care/management as evidenced by: lowering his a1c by 1-2 points.  Interventions:  Provided education to patient about basic DM disease process Reviewed medications with patient and discussed importance of medication adherence.   Patient continues to be compliant with medications. Discussed plans with patient for ongoing care management follow up and provided patient with direct contact information for care management team Will  mail the  patient written educational materials related to hypo and hyperglycemia and importance of correct treatment. Patient received the educational material and is reviewing it.  Review of patient status, including review of consultants reports, relevant laboratory and other test results, and medications completed. Has your doctor given you an a1c target? Yes 7  or below What type of glucometer do you use? Accu check Have you received diabetes education in the past? Yews he states that it has been in the  last 2 years. Reviewed the hypoglycemic protocol.  Patient verbalized understanding.   - blood glucose monitoring encouraged 09/03/2020: Patient continues to check blood sugars 3 times per day. - blood glucose readings reviewed.  12/01/20: Mr. Kinser said that he is doing well. He states that he fell off with his diet a little. He bought a box of cream cakes and has been eating one daily. We discussed his diet, which is not the ideal food to eat daily, maybe once a week. He gave me some readings for his blood sugars, and he is doing well with the morning and evening readings ranging from 82-91. His noonday reading is around 321-387. He feels it is because this is when he eats breakfast, lunch, cereal, or other things and has not taken his Jardiance yet. Mr. Umbach informed me that he had his vision was checked last month with no complications. He has a slight cataract that is starting to form, but nothing to worry about now. Mr. Ress has an appointment with Nephrology on October 31st about a kidney problem. He was unsure of the date but went to [facility].  - healthy lifestyle promoted Patient expressed that  he has some financial concerns and RNCM will send a Referral to the care guides to help.  Patient Goals/Self Care Activities:  Patient verbalizes understanding of plan Self-administers medications as prescribed Calls pharmacy for medication refills Call's provider office for new concerns or questions check blood sugar at prescribed times      Lazaro Arms RN, BSN, [facility] Care Management Coordinator [place] Phone: [phone] I Fax: [phone]

## 2020-12-04 ENCOUNTER — Ambulatory Visit: Payer: Self-pay

## 2020-12-04 NOTE — Chronic Care Management (AMB) (Signed)
   RN Case Manager Care Management   Phone Outreach    12/04/2020 Name: Jordan Rodriguez MRN: [id number] DOB: 07-Oct-1950  Jordan Rodriguez is a 70 y.o. year old male who is a primary care patient of Sabino Dick, DO .   CCM RNCM received a call today from the patient needing a refill on his Metformin because he does not have any refills.  I advised the patient to call the pharmacy and have them send the request to the office.  I gave him an explanation of why it is best, and he verbalized understanding.   Follow Up Plan: RNCM will follow up at the next scheduled Interval.   Review of patient status, including review of consultants reports, relevant laboratory and other test results, and collaboration with appropriate care team members and the patient's provider was performed as part of comprehensive patient evaluation and provision of care management services.    Juanell Fairly RN, BSN, [facility] Care Management Coordinator [facility] Phone: [phone] I Fax: [phone]

## 2020-12-11 ENCOUNTER — Other Ambulatory Visit: Payer: Self-pay | Admitting: Family Medicine

## 2020-12-28 DIAGNOSIS — E1122 Type 2 diabetes mellitus with diabetic chronic kidney disease: Secondary | ICD-10-CM | POA: Diagnosis not present

## 2020-12-28 DIAGNOSIS — N1832 Chronic kidney disease, stage 3b: Secondary | ICD-10-CM | POA: Diagnosis not present

## 2020-12-28 DIAGNOSIS — D631 Anemia in chronic kidney disease: Secondary | ICD-10-CM | POA: Diagnosis not present

## 2020-12-28 DIAGNOSIS — N2581 Secondary hyperparathyroidism of renal origin: Secondary | ICD-10-CM | POA: Diagnosis not present

## 2020-12-28 DIAGNOSIS — E872 Acidosis, unspecified: Secondary | ICD-10-CM | POA: Diagnosis not present

## 2020-12-28 DIAGNOSIS — I129 Hypertensive chronic kidney disease with stage 1 through stage 4 chronic kidney disease, or unspecified chronic kidney disease: Secondary | ICD-10-CM | POA: Diagnosis not present

## 2020-12-28 DIAGNOSIS — E875 Hyperkalemia: Secondary | ICD-10-CM | POA: Diagnosis not present

## 2020-12-28 DIAGNOSIS — R809 Proteinuria, unspecified: Secondary | ICD-10-CM | POA: Diagnosis not present

## 2021-01-05 ENCOUNTER — Telehealth: Payer: Self-pay

## 2021-01-05 ENCOUNTER — Telehealth: Payer: Medicaid Other

## 2021-01-05 NOTE — Telephone Encounter (Signed)
   RN Case Manager Care Management   Phone Outreach    01/05/2021 Name: Jordan Rodriguez MRN: [id number] DOB: 1950/07/19  [name] is a 70 y.o. year old male who is a primary care patient of Sabino Dick, DO .   Telephone outreach was unsuccessful A HIPPA compliant phone message was left for the patient providing contact information and requesting a return call.   Follow Up Plan: Will route chart to Care Guide to see if patient would like to reschedule phone appointment    Review of patient status, including review of consultants reports, relevant laboratory and other test results, and collaboration with appropriate care team members and the patient's provider was performed as part of comprehensive patient evaluation and provision of care management services.    Juanell Fairly RN, BSN, [facility] Care Management Coordinator [facility] Phone: [phone] I Fax: [phone]

## 2021-01-08 NOTE — Telephone Encounter (Signed)
Rescheduled 01/14/21  Jordan Rodriguez  Care Guide, Embedded Care Coordination [facility]  Direct Dial: [phone]

## 2021-01-14 ENCOUNTER — Ambulatory Visit: Payer: Medicaid Other

## 2021-01-14 NOTE — Patient Instructions (Signed)
Visit Information  [name]  it was nice speaking with you. Please call me directly [phone] if you have questions about the goals we discussed.  Patient Goals/Self Care Activities: -Patient will self administer medications as prescribed as evidenced by self report/primary caregiver report  -Patient will attend all scheduled provider appointments as evidenced by clinician review of documented attendance to scheduled appointments and patient/caregiver report -Patient will call pharmacy for medication refills as evidenced by patient report and review of pharmacy fill history as appropriate -Patient will call provider office for new concerns or questions as evidenced by review of documented incoming telephone call notes and patient report -Patient verbalizes understanding of plan -Patient will focus on medication adherence by take medication as prescribed -check blood sugar at prescribed times -check blood sugar if I feel it is too high or too low -take the blood sugar meter to all doctor visits    The patient verbalized understanding of instructions, educational materials, and care plan provided today and declined offer to receive copy of patient instructions, educational materials, and care plan.   Follow up Plan: Patient would like continued follow-up.  CCM RNCM will outreach the patient within the next 4 weeks.  Patient will call office if needed prior to next encounter  Jordan Fairly, RN  [phone]

## 2021-01-14 NOTE — Chronic Care Management (AMB) (Signed)
Chronic Care Management   CCM RN Visit Note  01/14/2021 Name: Jordan Rodriguez MRN: [id number] DOB: [birth year]  Subjective: Jordan Rodriguez is a 70 y.o. year old male who is a primary care patient of Sharion Settler, DO. The care management team was consulted for assistance with disease management and care coordination needs.    Engaged with patient by telephone for follow up visit in response to provider referral for case management and/or care coordination services.   Consent to Services:  The patient was given information about Chronic Care Management services, agreed to services, and gave verbal consent prior to initiation of services.  Please see initial visit note for detailed documentation.   Patient agreed to services and verbal consent obtained.    Assessment: Patient is currently experiencing difficulty with cold and cough but he not experiencing any problems with his blood sugars.. See Care Plan below for interventions and patient self-care actives. Follow up Plan: Patient would like continued follow-up.  CCM RNCM will outreach the patient within the next 4 weeks.  Patient will call office if needed prior to next encounter  Review of patient past medical history, allergies, medications, health status, including review of consultants reports, laboratory and other test data, was performed as part of comprehensive evaluation and provision of chronic care management services.   SDOH (Social Determinants of Health) assessments and interventions performed:    CCM Care Plan  No Known Allergies  Outpatient Encounter Medications as of 01/14/2021  Medication Sig Note   ACCU-CHEK GUIDE test strip USE 1 STRIP THREE TIMES DAILY    Accu-Chek Softclix Lancets lancets SMARTSIG:1 Topical 4 Times Daily    Accu-Chek Softclix Lancets lancets Use as instructed    aspirin EC 81 MG tablet Take 1 tablet (81 mg total) by mouth daily.    atorvastatin (LIPITOR) 40 MG tablet Take 1 tablet  (40 mg total) by mouth daily.    blood glucose meter kit and supplies KIT Dispense based on patient and insurance preference. Use up to four times daily as directed. (FOR ICD-9 250.00, 250.01).    empagliflozin (JARDIANCE) 25 MG TABS tablet Take 1 tablet (25 mg total) by mouth daily.    glucose blood (ACCU-CHEK AVIVA) test strip Use to check blood sugar 3x a day. Dx Code: E11.8    glucose blood (BAYER CONTOUR TEST) test strip Use to test blood sugar up to 4 times a day.  ICD-10 code E11.65. (Patient taking differently: Use to test blood sugar up to 4 times a day.  ICD-10 code E11.65.)    insulin glargine (LANTUS) 100 UNIT/ML Solostar Pen Inject 10 Units into the skin every morning. (Patient taking differently: Inject 12 Units into the skin every morning.) 06/23/2020: Patient 15 units in the morning.   Insulin Pen Needle 32G X 4 MM MISC 1 Container by Does not apply route 3 (three) times daily.    Lancet Devices (ACCU-CHEK SOFTCLIX) lancets Check blood glucose up to 3 times daily.    losartan (COZAAR) 25 MG tablet Take 25 mg by mouth at bedtime.    metFORMIN (GLUCOPHAGE) 500 MG tablet TAKE 3 TABLETS BY MOUTH ONCE DAILY WITH BREAKFAST    sildenafil (VIAGRA) 50 MG tablet Take 1 tablet (50 mg total) by mouth daily as needed for erectile dysfunction.    sodium bicarbonate 650 MG tablet Take 650 mg by mouth 2 (two) times daily.    No facility-administered encounter medications on file as of 01/14/2021.    Patient Active Problem List  Diagnosis Date Noted   Hyperkalemia 05/25/2020   Muscle cramps 11/11/2019   Erectile dysfunction 11/11/2019   Advance directive discussed with patient 07/15/2019   CKD (chronic kidney disease) stage 3, GFR 30-59 ml/min ([place]) 06/26/2019   Hyperlipidemia 12/24/2015   Diverticulosis of colon 10/29/2013   Controlled diabetes mellitus type 2 with complications ([place]) 19/62/2297    Conditions to be addressed/monitored:DMII  Care Plan : RN CAse Manager  Updates made by  Lazaro Arms, RN since 01/14/2021 12:00 AM     Problem: Glycemic Management (Diabetes, Type 2)      Long-Range Goal: Patient will monitor and manage diabetes with medication and food intake.   Start Date: 06/23/2020  Expected End Date: 02/26/2021  Recent Progress: On track  Priority: High  Note:   Objective:  Lab Results  Component Value Date   HGBA1C 7.8 (A) 10/27/2020    Lab Results  Component Value Date   CREATININE 1.95 (H) 10/27/2020   CREATININE 1.96 (H) 06/24/2020   CREATININE 1.88 (H) 06/08/2020   Lab Results  Component Value Date   EGFR 37 (L) 10/27/2020    Current Barriers:  Knowledge Deficits related to basic Diabetes pathophysiology and self care/management  Case Manager Clinical Goal(s):  Over the next 90 days, patient will demonstrate improved adherence to prescribed treatment plan for diabetes self care/management as evidenced by: lowering his a1c by 1-2 points.  Interventions:  Provided education to patient about basic DM disease process Reviewed medications with patient and discussed importance of medication adherence.   Patient continues to be compliant with medications. Discussed plans with patient for ongoing care management follow up and provided patient with direct contact information for care management team Review of patient status, including review of consultants reports, relevant laboratory and other test results, and medications completed. Reviewed the hypoglycemic protocol.  Patient verbalized understanding.   - blood glucose monitoring encouraged  - blood glucose readings reviewed.  01/14/21:  Jordan Rodriguez said he is getting over what he thinks may have been the flu. He went to see his Nephrologist about two weeks ago, and the Nephrologist increased losartan from 25 mg to 50 mg. After the increase in medication, a cough,  feeling weak and congested, started. He is feeling better now because he stopped taking 50 mg and is only taking 25 mg. I  advised him to call the nephrology office and inform them about the medication and the effects it caused him, and the change of dosage. His blood sugars range from 70 to 100, with two being around 200 and one 67. He is taking his medication as prescribed. He wanted an appointment for a follow-up for his diabetes and a flu shot. I contacted the office, and we scheduled an appointment for 02/08/21 at 230 with his PCP.   Patient Goals/Self Care Activities: -Patient will self administer medications as prescribed as evidenced by self report/primary caregiver report  -Patient will attend all scheduled provider appointments as evidenced by clinician review of documented attendance to scheduled appointments and patient/caregiver report -Patient will call pharmacy for medication refills as evidenced by patient report and review of pharmacy fill history as appropriate -Patient will call provider office for new concerns or questions as evidenced by review of documented incoming telephone call notes and patient report -Patient verbalizes understanding of plan -Patient will focus on medication adherence by take medication as prescribed -check blood sugar at prescribed times -check blood sugar if I feel it is too high or too low -take the  blood sugar meter to all doctor visits          Lazaro Arms RN, BSN, [facility] Care Management Coordinator [place] Phone: [phone] I Fax: [phone]

## 2021-02-07 ENCOUNTER — Other Ambulatory Visit: Payer: Self-pay | Admitting: Family Medicine

## 2021-02-07 DIAGNOSIS — Z794 Long term (current) use of insulin: Secondary | ICD-10-CM

## 2021-02-07 DIAGNOSIS — E118 Type 2 diabetes mellitus with unspecified complications: Secondary | ICD-10-CM

## 2021-02-07 NOTE — Patient Instructions (Signed)
It was wonderful to see you today.  Please bring ALL of your medications with you to every visit.   Today we talked about:  -Your hemoglobin A1c was 7.5, the goal is to get this under 7! We are almost there! -Continue your medications as prescribed. -Take the 50 mg of Losartan like your nephrologists had recommended. Return in 1 week for blood testing here. The order is already placed, you will just need to come for a lab visit sometime next week. -Follow up in 3 months for your next diabetes check up.  -You received your Flu shot today.  Thank you for choosing [facility].   Please call [phone] with any questions about today's appointment.  Please be sure to schedule follow up at the front  desk before you leave today.   Sabino Dick, DO PGY-2 Family Medicine

## 2021-02-07 NOTE — Progress Notes (Signed)
    SUBJECTIVE:   CHIEF COMPLAINT / HPI:   T2DM F/U - Last A1c 7.8 in August 2022 - Medications: Metformin 1500 mg, Lantus 10 U, Jardiance 25 mg  - Compliance: Daily (had run out of Metformin so hasn't taken the last two days- refill sent today).  - Checking BG at home: Yes, home readings reviewed and range generally in the low 100s. No readings <60, highest in the 360s but this is rare. - Diet: "I still eat some cakes and things sometimes". He is trying to work on diet but still has sweet cravings. - Exercise: Walks in the morning, "but not far", he walks to put his neighbors paper on the porch. - Eye exam: Up to date - Foot exam: Up to date - ACE-I: Seen by [facility] on 10/31 and advised to increase Losartan from 25 mg to 50 mg daily.  States that he took one dose of it but then had "flu-like symptoms" where he was weak and sweaty and so he went back to 25 mg.  - Statin: On Atorvastatin 40 mg  - Denies symptoms of hypoglycemia, polyuria, polydipsia, numbness extremities, foot ulcers/trauma  PERTINENT  PMH / PSH:  CKD stage IIIb, HLD, Erectile dysfunction   OBJECTIVE:   BP 125/60   Pulse (!) 58   Temp 97.7 F (36.5 C)   Ht 5\' 6"  (1.676 m)   Wt 122 lb 3.2 oz (55.4 kg)   SpO2 98%   BMI 19.72 kg/m    General: NAD, pleasant, able to participate in exam Respiratory: Breathing comfortably, normal effort Extremities: no edema or cyanosis. Foot exam: No deformities, ulcerations, or other skin breakdown on feet bilaterally.  Sensation intact to monofilament and light touch.  PT and DP pulses intact BL.   Skin: warm and dry, no rashes noted Neuro: alert, no obvious focal deficits Psych: Normal affect and mood  ASSESSMENT/PLAN:   Controlled diabetes mellitus type 2 with complications (HCC) Hemoglobin A1c is slightly improved today at 7.5.  Congratulated patient on this.  Glucometer review showed a 7-day average of 136, 14-day average of 160.  No hypoglycemic  episodes noted. -Continue current regimen -Follow-up in 3 months for repeat A1c -Discussed with patient that if he were to have hypoglycemic symptoms to let know and we will back down on the insulin.  CKD (chronic kidney disease) stage 3, GFR 30-59 ml/min Per recent nephrologist note, patient was supposed to increase his losartan to 50 mg daily.  He had not been taking this due to "flulike symptoms".  We discussed this is likely coincidence and not related to the medication.  He is amenable to trying the increased dose again.  He will return in 1 week for a BMP. -Follow-up BMP; Future order placed  Needs flu shot Provided today without complication.     [place], DO [facility]

## 2021-02-08 ENCOUNTER — Other Ambulatory Visit: Payer: Self-pay

## 2021-02-08 ENCOUNTER — Encounter: Payer: Self-pay | Admitting: Family Medicine

## 2021-02-08 ENCOUNTER — Ambulatory Visit (INDEPENDENT_AMBULATORY_CARE_PROVIDER_SITE_OTHER): Payer: Medicare Other | Admitting: Family Medicine

## 2021-02-08 VITALS — BP 125/60 | HR 58 | Temp 97.7°F | Ht 66.0 in | Wt 122.2 lb

## 2021-02-08 DIAGNOSIS — Z794 Long term (current) use of insulin: Secondary | ICD-10-CM | POA: Diagnosis not present

## 2021-02-08 DIAGNOSIS — E118 Type 2 diabetes mellitus with unspecified complications: Secondary | ICD-10-CM | POA: Diagnosis not present

## 2021-02-08 DIAGNOSIS — Z23 Encounter for immunization: Secondary | ICD-10-CM | POA: Diagnosis not present

## 2021-02-08 DIAGNOSIS — N1832 Chronic kidney disease, stage 3b: Secondary | ICD-10-CM

## 2021-02-08 LAB — POCT GLYCOSYLATED HEMOGLOBIN (HGB A1C): HbA1c, POC (controlled diabetic range): 7.5 % — AB (ref 0.0–7.0)

## 2021-02-08 NOTE — Assessment & Plan Note (Signed)
Per recent nephrologist note, patient was supposed to increase his losartan to 50 mg daily.  He had not been taking this due to "flulike symptoms".  We discussed this is likely coincidence and not related to the medication.  He is amenable to trying the increased dose again.  He will return in 1 week for a BMP. -Follow-up BMP; Future order placed

## 2021-02-08 NOTE — Assessment & Plan Note (Signed)
Provided today without complication. 

## 2021-02-08 NOTE — Assessment & Plan Note (Signed)
Hemoglobin A1c is slightly improved today at 7.5.  Congratulated patient on this.  Glucometer review showed a 7-day average of 136, 14-day average of 160.  No hypoglycemic episodes noted. -Continue current regimen -Follow-up in 3 months for repeat A1c -Discussed with patient that if he were to have hypoglycemic symptoms to let us know and we will back down on the insulin.

## 2021-02-09 ENCOUNTER — Other Ambulatory Visit: Payer: Self-pay | Admitting: Family Medicine

## 2021-02-09 DIAGNOSIS — N1832 Chronic kidney disease, stage 3b: Secondary | ICD-10-CM

## 2021-02-09 LAB — BASIC METABOLIC PANEL
BUN/Creatinine Ratio: 14 (ref 10–24)
BUN: 24 mg/dL (ref 8–27)
CO2: 22 mmol/L (ref 20–29)
Calcium: 8.8 mg/dL (ref 8.6–10.2)
Chloride: 104 mmol/L (ref 96–106)
Creatinine, Ser: 1.71 mg/dL — ABNORMAL HIGH (ref 0.76–1.27)
Glucose: 166 mg/dL — ABNORMAL HIGH (ref 70–99)
Potassium: 5.1 mmol/L (ref 3.5–5.2)
Sodium: 139 mmol/L (ref 134–144)
eGFR: 43 mL/min/{1.73_m2} — ABNORMAL LOW (ref >59)

## 2021-02-09 NOTE — Progress Notes (Signed)
Future BMP ordered for next week due to Losartan increase.

## 2021-02-11 ENCOUNTER — Ambulatory Visit: Payer: Medicare Other

## 2021-02-11 DIAGNOSIS — Z794 Long term (current) use of insulin: Secondary | ICD-10-CM

## 2021-02-11 DIAGNOSIS — N1832 Chronic kidney disease, stage 3b: Secondary | ICD-10-CM

## 2021-02-11 NOTE — Chronic Care Management (AMB) (Signed)
Chronic Care Management   CCM RN Visit Note  02/11/2021 Name: Jordan Rodriguez MRN: [id number] DOB: 30-May-1950  Subjective: Jordan Rodriguez is a 70 y.o. year old male who is a primary care patient of Jordan Settler, DO. The care management team was consulted for assistance with disease management and care coordination needs.    Engaged with patient by telephone for follow up visit in response to provider referral for case management and/or care coordination services.   Consent to Services:  The patient was given information about Chronic Care Management services, agreed to services, and gave verbal consent prior to initiation of services.  Please see initial visit note for detailed documentation.   Patient agreed to services and verbal consent obtained.    Assessment:  The patient continues to maintain positive progress with care plan goals.. See Care Plan below for interventions and patient self-care actives. Follow up Plan: Patient would like continued follow-up.  CCM RNCM will outreach the patient within the next 4 weeks.  Patient will call office if needed prior to next encounter Review of patient past medical history, allergies, medications, health status, including review of consultants reports, laboratory and other test data, was performed as part of comprehensive evaluation and provision of chronic care management services.   SDOH (Social Determinants of Health) assessments and interventions performed:    CCM Care Plan  No Known Allergies  Outpatient Encounter Medications as of 02/11/2021  Medication Sig Note   ACCU-CHEK GUIDE test strip USE 1 STRIP THREE TIMES DAILY    Accu-Chek Softclix Lancets lancets SMARTSIG:1 Topical 4 Times Daily    Accu-Chek Softclix Lancets lancets Use as instructed    aspirin EC 81 MG tablet Take 1 tablet (81 mg total) by mouth daily.    atorvastatin (LIPITOR) 40 MG tablet Take 1 tablet (40 mg total) by mouth daily.    blood glucose meter  kit and supplies KIT Dispense based on patient and insurance preference. Use up to four times daily as directed. (FOR ICD-9 250.00, 250.01).    empagliflozin (JARDIANCE) 25 MG TABS tablet Take 1 tablet (25 mg total) by mouth daily.    glucose blood (ACCU-CHEK AVIVA) test strip Use to check blood sugar 3x a day. Dx Code: E11.8    glucose blood (BAYER CONTOUR TEST) test strip Use to test blood sugar up to 4 times a day.  ICD-10 code E11.65. (Patient taking differently: Use to test blood sugar up to 4 times a day.  ICD-10 code E11.65.)    insulin glargine (LANTUS) 100 UNIT/ML Solostar Pen Inject 10 Units into the skin every morning. (Patient taking differently: Inject 12 Units into the skin every morning.) 02/08/2021: Taking 10U   Insulin Pen Needle 32G X 4 MM MISC 1 Container by Does not apply route 3 (three) times daily.    Lancet Devices (ACCU-CHEK SOFTCLIX) lancets Check blood glucose up to 3 times daily.    losartan (COZAAR) 25 MG tablet Take 25 mg by mouth at bedtime. 02/08/2021: Supposed to be 50 mg now   metFORMIN (GLUCOPHAGE) 500 MG tablet TAKE 3 TABLETS BY MOUTH ONCE DAILY WITH BREAKFAST    sildenafil (VIAGRA) 50 MG tablet Take 1 tablet (50 mg total) by mouth daily as needed for erectile dysfunction.    sodium bicarbonate 650 MG tablet Take 650 mg by mouth 2 (two) times daily.    No facility-administered encounter medications on file as of 02/11/2021.    Patient Active Problem List   Diagnosis Date Noted  Needs flu shot 02/08/2021   Hyperkalemia 05/25/2020   Muscle cramps 11/11/2019   Erectile dysfunction 11/11/2019   Advance directive discussed with patient 07/15/2019   CKD (chronic kidney disease) stage 3, GFR 30-59 ml/min ([place]) 06/26/2019   Hyperlipidemia 12/24/2015   Diverticulosis of colon 10/29/2013   Controlled diabetes mellitus type 2 with complications ([place]) 09/32/3557    Conditions to be addressed/monitored:DMII  Care Plan : RN CAse Manager  Updates made by Lazaro Arms, RN since 02/11/2021 12:00 AM     Problem: Glycemic Management (Diabetes, Type 2)      Long-Range Goal: Patient will monitor and manage diabetes with medication and food intake.   Start Date: 06/23/2020  Expected End Date: 03/30/2021  Recent Progress: On track  Priority: High  Note:    Current Barriers:  Knowledge Deficits related to basic Diabetes pathophysiology and self care/management  Case Manager Clinical Goal(s):  Over the next 90 days, patient will demonstrate improved adherence to prescribed treatment plan for diabetes self care/management as evidenced by: lowering his a1c by 1-2 points.  Interventions: 1:1 collaboration with primary care provider regarding development and update of comprehensive plan of care as evidenced by provider attestation and co-signature Inter-disciplinary care team collaboration (see longitudinal plan of care) Evaluation of current treatment plan related to  self management and patient's adherence to plan as established by provider   Diabetes:  (Status: Goal on Track (progressing): YES.) Long Term Goal   Lab Results  Component Value Date   HGBA1C 7.5 (A) 02/08/2021  Assessed patient's understanding of A1c goal: <7%  Provided education to patient about basic DM disease process Reviewed medications with patient and discussed importance of medication adherence.   Patient continues to be compliant with medications. Discussed plans with patient for ongoing care management follow up and provided patient with direct contact information for care management team Review of patient status, including review of consultants reports, relevant laboratory and other test results, and medications completed. Reviewed the hypoglycemic protocol.  Patient verbalized understanding.   - blood glucose monitoring encouraged  - blood glucose readings reviewed.  02/11/21:  [name] is doing well; his FBS today was 123, and at noon it was 130. He had an  appointment on 02/08/21 and had his a1c checked, which was 7.5, down three months ago from 7.8, and he received his flu shot. He monitors his diet but says he needs to cut down on sweets. He is currently moving new address that has been added to the system  Patient Goals/Self Care Activities: -Patient/Caregiver will self-administer medications as prescribed as evidenced by self-report/primary caregiver report  -Patient/Caregiver will attend all scheduled provider appointments as evidenced by clinician review of documented attendance to scheduled appointments and patient/caregiver report -Patient/Caregiver will call pharmacy for medication refills as evidenced by patient report and review of pharmacy fill history as appropriate -Patient/Caregiver will call provider office for new concerns or questions as evidenced by review of documented incoming telephone call notes and patient report -Patient/Caregiver verbalizes understanding of plan -Patient/Caregiver will focus on medication adherence by taking medications as prescribed -check blood sugar at prescribed times -check blood sugar if I feel it is too high or too low -record values and write them down take them to all doctor visits      Lazaro Arms RN, BSN, [place] Management Coordinator [place] Phone: [phone] I Fax: [phone]

## 2021-02-11 NOTE — Patient Instructions (Signed)
Visit Information  Mr. Defelice  it was nice speaking with you. Please call me directly [phone] if you have questions about the goals we discussed.  -Patient/Caregiver will self-administer medications as prescribed as evidenced by self-report/primary caregiver report  -Patient/Caregiver will attend all scheduled provider appointments as evidenced by clinician review of documented attendance to scheduled appointments and patient/caregiver report -Patient/Caregiver will call pharmacy for medication refills as evidenced by patient report and review of pharmacy fill history as appropriate -Patient/Caregiver will call provider office for new concerns or questions as evidenced by review of documented incoming telephone call notes and patient report -Patient/Caregiver verbalizes understanding of plan -Patient/Caregiver will focus on medication adherence by taking medications as prescribed -check blood sugar at prescribed times -check blood sugar if I feel it is too high or too low -record values and write them down take them to all doctor visits    The patient verbalized understanding of instructions, educational materials, and care plan provided today and declined offer to receive copy of patient instructions, educational materials, and care plan.   Follow up Plan: Patient would like continued follow-up.  CCM RNCM will outreach the patient within the next 4 weeks  Patient will call office if needed prior to next encounter  Juanell Fairly, RN  [phone]

## 2021-02-15 ENCOUNTER — Other Ambulatory Visit: Payer: Medicare Other

## 2021-02-15 ENCOUNTER — Other Ambulatory Visit: Payer: Self-pay

## 2021-02-15 DIAGNOSIS — N1832 Chronic kidney disease, stage 3b: Secondary | ICD-10-CM

## 2021-02-16 LAB — BASIC METABOLIC PANEL
BUN/Creatinine Ratio: 12 (ref 10–24)
BUN: 23 mg/dL (ref 8–27)
CO2: 21 mmol/L (ref 20–29)
Calcium: 8.5 mg/dL — ABNORMAL LOW (ref 8.6–10.2)
Chloride: 107 mmol/L — ABNORMAL HIGH (ref 96–106)
Creatinine, Ser: 1.93 mg/dL — ABNORMAL HIGH (ref 0.76–1.27)
Glucose: 137 mg/dL — ABNORMAL HIGH (ref 70–99)
Potassium: 4.8 mmol/L (ref 3.5–5.2)
Sodium: 139 mmol/L (ref 134–144)
eGFR: 37 mL/min/{1.73_m2} — ABNORMAL LOW (ref >59)

## 2021-03-18 ENCOUNTER — Ambulatory Visit: Payer: Medicare Other

## 2021-03-18 NOTE — Chronic Care Management (AMB) (Signed)
Chronic Care Management   CCM RN Visit Note  03/18/2021 Name: Jordan Rodriguez MRN: [id number] DOB: 03/10/1950  Subjective: VAHE PIENTA is a 71 y.o. year old male who is a primary care patient of Sabino Dick, DO. The care management team was consulted for assistance with disease management and care coordination needs.    Engaged with patient by telephone for follow up visit in response to provider referral for case management and/or care coordination services.   Consent to Services:  The patient was given information about Chronic Care Management services, agreed to services, and gave verbal consent prior to initiation of services.  Please see initial visit note for detailed documentation.   Patient agreed to services and verbal consent obtained.    Summary: Patient is making progress with diabetes , but continues to have experience difficulty with learning about his food and drink choices . See Care Plan below for interventions and patient self-care actives.  Recommendation: The patient may benefit from monitoring his diet and blood sugar values to see which food and drinks tend to elevate his blood sugars. The patient agrees.   Follow up Plan: Patient would like continued follow-up.  CCM RNCM will outreach the patient within the next 5 weeks.  Patient will call office if needed prior to next encounter   Assessment: Review of patient past medical history, allergies, medications, health status, including review of consultants reports, laboratory and other test data, was performed as part of comprehensive evaluation and provision of chronic care management services.   SDOH (Social Determinants of Health) assessments and interventions performed:  No  CCM Care Plan   Conditions to be addressed/monitored:DMII  Care Plan : RN CAse Manager  Updates made by Juanell Fairly, RN since 03/18/2021 12:00 AM     Problem: Glycemic Management (Diabetes, Type 2)      Long-Range  Goal: Patient will monitor and manage diabetes with medication and food intake.   Start Date: 06/23/2020  Expected End Date: 05/28/2021  Recent Progress: On track  Priority: High  Note:    Current Barriers:  Knowledge Deficits related to basic Diabetes pathophysiology and self care/management  Case Manager Clinical Goal(s):  Over the next 90 days, patient will demonstrate improved adherence to prescribed treatment plan for diabetes self care/management as evidenced by: lowering his a1c by 1-2 points.  Interventions: 1:1 collaboration with primary care provider regarding development and update of comprehensive plan of care as evidenced by provider attestation and co-signature Inter-disciplinary care team collaboration (see longitudinal plan of care) Evaluation of current treatment plan related to  self management and patient's adherence to plan as established by provider   Diabetes:  (Status: Goal on Track (progressing): YES.) Long Term Goal   Lab Results  Component Value Date   HGBA1C 7.5 (A) 02/08/2021  Assessed patient's understanding of A1c goal: <7%  Reviewed medications with patient and discussed importance of medication adherence.   Patient continues to be compliant with medications. Discussed plans with patient for ongoing care management follow up and provided patient with direct contact information for care management team Review of patient status, including review of consultants reports, relevant laboratory and other test results, and medications completed. Reviewed the hypoglycemic protocol.  Patient verbalized understanding.   blood glucose monitoring encouraged  blood glucose readings reviewed.  03/18/21:  Mr. Catena checked his FBS today, and it was 90.  He is taking his medications but drinking orange juice with his morning meals, and some of his blood sugars mid-day  ranged in the 2-3 hundred.  We talked about things to eat and drink that can cause him to elevate his  blood sugar and about moderation.  He verbalized understanding.  Patient Goals/Self Care Activities: -Patient/Caregiver will self-administer medications as prescribed as evidenced by self-report/primary caregiver report  -Patient/Caregiver will attend all scheduled provider appointments as evidenced by clinician review of documented attendance to scheduled appointments and patient/caregiver report -Patient/Caregiver will call pharmacy for medication refills as evidenced by patient report and review of pharmacy fill history as appropriate -Patient/Caregiver will call provider office for new concerns or questions as evidenced by review of documented incoming telephone call notes and patient report -Patient/Caregiver verbalizes understanding of plan -Patient/Caregiver will focus on medication adherence by taking medications as prescribed -check blood sugar at prescribed times -check blood sugar if I feel it is too high or too low -record values and write them down take them to all doctor visits      Juanell Fairly RN, BSN, [facility] Care Management Coordinator [facility]  Phone: [phone]

## 2021-03-18 NOTE — Patient Instructions (Signed)
Visit Information  Jordan Rodriguez  it was nice speaking with you. Please call me directly [phone] if you have questions about the goals we discussed.   Patient Goals/Self Care Activities: -Patient/Caregiver will self-administer medications as prescribed as evidenced by self-report/primary caregiver report  -Patient/Caregiver will attend all scheduled provider appointments as evidenced by clinician review of documented attendance to scheduled appointments and patient/caregiver report -Patient/Caregiver will call pharmacy for medication refills as evidenced by patient report and review of pharmacy fill history as appropriate -Patient/Caregiver will call provider office for new concerns or questions as evidenced by review of documented incoming telephone call notes and patient report -Patient/Caregiver verbalizes understanding of plan -Patient/Caregiver will focus on medication adherence by taking medications as prescribed -check blood sugar at prescribed times -check blood sugar if I feel it is too high or too low -record values and write them down take them to all doctor visits        The patient verbalized understanding of instructions, educational materials, and care plan provided today and agreed to receive a mailed copy of patient instructions, educational materials, and care plan.   Follow up Plan: Patient would like continued follow-up.  CCM RNCM will outreach the patient within the next 5 weeks.  Patient will call office if needed prior to next encounter  Juanell Fairly, RN  [phone]

## 2021-03-19 ENCOUNTER — Other Ambulatory Visit: Payer: Self-pay | Admitting: Family Medicine

## 2021-04-26 ENCOUNTER — Ambulatory Visit: Payer: Medicare Other

## 2021-04-26 NOTE — Patient Instructions (Signed)
Visit Information  Jordan Rodriguez  it was nice speaking with you. Please call me directly [phone] if you have questions about the goals we discussed.  Patient Goals/Self Care Activities: -Patient/Caregiver will self-administer medications as prescribed as evidenced by self-report/primary caregiver report  -Patient/Caregiver will attend all scheduled provider appointments as evidenced by clinician review of documented attendance to scheduled appointments and patient/caregiver report -Patient/Caregiver will call pharmacy for medication refills as evidenced by patient report and review of pharmacy fill history as appropriate -Patient/Caregiver will call provider office for new concerns or questions as evidenced by review of documented incoming telephone call notes and patient report -Patient/Caregiver verbalizes understanding of plan -Patient/Caregiver will focus on medication adherence by taking medications as prescribed -Monitor his Diet and meal prep when he is able to eat the correct foods -check blood sugar at prescribed times -check blood sugar if I feel it is too high or too low -record values and write them down take them to all doctor visits              The patient verbalized understanding of instructions, educational materials, and care plan provided today and agreed to receive a mailed copy of patient instructions, educational materials, and care plan.   Follow up Plan: Patient would like continued follow-up.  CCM RNCM will outreach the patient within the next 5 weeks.  Patient will call office if needed prior to next encounter  Juanell Fairly, RN  [phone]

## 2021-04-26 NOTE — Chronic Care Management (AMB) (Signed)
Chronic Care Management   CCM RN Visit Note  04/26/2021 Name: Jordan Rodriguez MRN: [id number] DOB: 1950-09-15  Subjective: Jordan Rodriguez is a 71 y.o. year old male who is a primary care patient of Sabino Dick, DO. The care management team was consulted for assistance with disease management and care coordination needs.    Engaged with patient by telephone for follow up visit in response to provider referral for case management and/or care coordination services.   Consent to Services:  The patient was given information about Chronic Care Management services, agreed to services, and gave verbal consent prior to initiation of services.  Please see initial visit note for detailed documentation.   Patient agreed to services and verbal consent obtained.    Summary: Patient is making progress with his , but currently is experiencing difficulty with slightly elevaated blood sugars . See Care Plan below for interventions and patient self-care actives.  Recommendation: The patient may benefit from monitoring his diet prepping his meals and writing and recording his values.  The patient agrees.  Follow up Plan: Patient would like continued follow-up.  CCM RNCM will outreach the patient within the next 5 weeks.  Patient will call office if needed prior to next encounter   Assessment: Review of patient past medical history, allergies, medications, health status, including review of consultants reports, laboratory and other test data, was performed as part of comprehensive evaluation and provision of chronic care management services.   SDOH (Social Determinants of Health) assessments and interventions performed:  No  CCM Care Plan    Conditions to be addressed/monitored:DMII  Care Plan : RN CAse Manager  Updates made by Juanell Fairly, RN since 04/26/2021 12:00 AM     Problem: Glycemic Management (Diabetes, Type 2)      Long-Range Goal: Patient will monitor and manage diabetes  with medication and food intake.   Start Date: 06/23/2020  Expected End Date: 05/28/2021  Recent Progress: On track  Priority: High  Note:    Current Barriers:  Knowledge Deficits related to basic Diabetes pathophysiology and self care/management  Case Manager Clinical Goal(s):  Over the next 90 days, patient will demonstrate improved adherence to prescribed treatment plan for diabetes self care/management as evidenced by: lowering his a1c by 1-2 points.  Interventions: 1:1 collaboration with primary care provider regarding development and update of comprehensive plan of care as evidenced by provider attestation and co-signature Inter-disciplinary care team collaboration (see longitudinal plan of care) Evaluation of current treatment plan related to  self management and patient's adherence to plan as established by provider   Diabetes:  (Status: Goal on Track (progressing): YES.) Long Term Goal   Lab Results  Component Value Date   HGBA1C 7.5 (A) 02/08/2021  Assessed patient's understanding of A1c goal: <7%  Reviewed medications with patient and discussed importance of medication adherence.   Patient continues to be compliant with medications. Discussed plans with patient for ongoing care management follow up and provided patient with direct contact information for care management team Review of patient status, including review of consultants reports, relevant laboratory and other test results, and medications completed. blood glucose monitoring encouraged  blood glucose readings reviewed.  Sending educational material on How to read labels and Calendar 04/26/21:  Jordan Rodriguez said he is doing okay, but his blood sugars have been slightly elevated. He needs to eat the right foods. He was eating more fruits and cereal with sugars in them. We discussed food types and meal prepping to  help him make better choices. His FBS today was 115 yesterday at noon 152 and at 8 pm, 74  that morning  113. He said there was also a couple of reading in the 2 and 3 hundred.  Patient Goals/Self Care Activities: -Patient/Caregiver will self-administer medications as prescribed as evidenced by self-report/primary caregiver report  -Patient/Caregiver will attend all scheduled provider appointments as evidenced by clinician review of documented attendance to scheduled appointments and patient/caregiver report -Patient/Caregiver will call pharmacy for medication refills as evidenced by patient report and review of pharmacy fill history as appropriate -Patient/Caregiver will call provider office for new concerns or questions as evidenced by review of documented incoming telephone call notes and patient report -Patient/Caregiver verbalizes understanding of plan -Patient/Caregiver will focus on medication adherence by taking medications as prescribed -Monitor his Diet and meal prep when he is able to eat the correct foods -check blood sugar at prescribed times -check blood sugar if I feel it is too high or too low -record values and write them down take them to all doctor visits        Juanell Fairly RN, BSN, [facility] Care Management Coordinator [facility]  Phone: [phone]

## 2021-04-27 ENCOUNTER — Other Ambulatory Visit: Payer: Self-pay | Admitting: Family Medicine

## 2021-05-21 ENCOUNTER — Ambulatory Visit: Payer: Medicare Other

## 2021-05-21 NOTE — Patient Instructions (Signed)
Visit Information ? ?Mr. Skillman  it was nice speaking with you. Please call me directly [phone] if you have questions about the goals we discussed. ? ?Patient Goals/Self Care Activities: ?-Patient/Caregiver will self-administer medications as prescribed as evidenced by self-report/primary caregiver report  ?-Patient/Caregiver will attend all scheduled provider appointments as evidenced by clinician review of documented attendance to scheduled appointments and patient/caregiver report ?-Patient/Caregiver will call pharmacy for medication refills as evidenced by patient report and review of pharmacy fill history as appropriate ?-Patient/Caregiver will call provider office for new concerns or questions as evidenced by review of documented incoming telephone call notes and patient report ?-Patient/Caregiver verbalizes understanding of plan ?-Patient/Caregiver will focus on medication adherence by taking medications as prescribed ?-Monitor his Diet and meal prep when he is able to eat the correct foods ?-check blood sugar at prescribed times ?-check blood sugar if I feel it is too high or too low ?-record values and write them down take them to all doctor visits  ?-eat meals to maintain blood sugar control. ?   ?  ? ? ? ?The patient verbalized understanding of instructions, educational materials, and care plan provided today and agreed to receive a mailed copy of patient instructions, educational materials, and care plan.  ? ?Follow up Plan: Patient would like continued follow-up.  CCM RNCM will outreach the patient within the next 5 weeks.  Patient will call office if needed prior to next encounter ? ?Antonya Leeder, RN ? ?[phone]  ?

## 2021-05-21 NOTE — Chronic Care Management (AMB) (Signed)
?Chronic Care Management  ? ?CCM RN Visit Note ? ?05/21/2021 ?Name: Jordan Rodriguez MRN: [id number] DOB: Feb 19, 1951 ? ?Subjective: ?Jordan Rodriguez is a 71 y.o. year old male who is a primary care patient of Sabino Dick, DO. The care management team was consulted for assistance with disease management and care coordination needs.   ? ?Engaged with patient by telephone for follow up visit in response to provider referral for case management and/or care coordination services.  ? ?Consent to Services:  ?The patient was given information about Chronic Care Management services, agreed to services, and gave verbal consent prior to initiation of services.  Please see initial visit note for detailed documentation.  ? ?Patient agreed to services and verbal consent obtained.  ? ? ?Summary:  The patient continues to maintain positive progress with care plan goals.   See Care Plan below for interventions and patient self-care actives. ? ?Recommendation: The patient may benefit from taking medications as prescribed, monitoring food intake, checking Blood sugar and record values, and calling your physician if numbers are abnormal ? ?Follow up Plan: Patient would like continued follow-up.  CCM RNCM will outreach the patient within the next 5 weeks.  Patient will call office if needed prior to next encounter ? ?Assessment: Review of patient past medical history, allergies, medications, health status, including review of consultants reports, laboratory and other test data, was performed as part of comprehensive evaluation and provision of chronic care management services.  ? ?SDOH (Social Determinants of Health) assessments and interventions performed:  No ? ?CCM Care Plan ? ? ? ?Conditions to be addressed/monitored:DMII ? ?Care Plan : RN Case Manager  ?Updates made by Juanell Fairly, RN since 05/21/2021 12:00 AM  ?  ? ?Problem: Glycemic Management (Diabetes, Type 2)   ?  ? ?Long-Range Goal: Patient will monitor and manage  diabetes with medication and food intake.   ?Start Date: 06/23/2020  ?Expected End Date: 07/28/2021  ?Recent Progress: On track  ?Priority: High  ?Note:   ? ?Current Barriers:  ?Knowledge Deficits related to basic Diabetes pathophysiology and self care/management ? ?Case Manager Clinical Goal(s):  ?Over the next 90 days, patient will demonstrate improved adherence to prescribed treatment plan for diabetes self care/management as evidenced by: lowering his a1c by 1-2 points. ? ?Interventions: ?1:1 collaboration with primary care provider regarding development and update of comprehensive plan of care as evidenced by provider attestation and co-signature ?Inter-disciplinary care team collaboration (see longitudinal plan of care) ?Evaluation of current treatment plan related to  self management and patient's adherence to plan as established by provider ? ? ?Diabetes:  (Status: Goal on Track (progressing): YES.) Long Term Goal  ? ?Lab Results  ?Component Value Date  ? HGBA1C 7.5 (A) 02/08/2021  ?Assessed patient's understanding of A1c goal: <7% ? ?Reviewed medications with patient and discussed importance of medication adherence.   Patient continues to be compliant with medications. ?Discussed plans with patient for ongoing care management follow up and provided patient with direct contact information for care management team ?Review of patient status, including review of consultants reports, relevant laboratory and other test results, and medications completed. ?blood glucose monitoring encouraged  ?blood glucose readings reviewed.  ?05/21/21: Mr. Nafziger is doing well; his blood sugars ranged from 108 to 200; he had two lows on the 12th, 64, and the 19th, 69; he felt weak at those times.  We discussed the Hypoglycemic protocol, and he verbalized understanding.  I also contacted the office, and we scheduled him an  appointment to come in on April 3rd at 210 pm with Dr. Melba Coon for a three-month follow-up Diabetes check  and recheck of his A1c per her last office note in December. ? ?Patient Goals/Self Care Activities: ?-Patient/Caregiver will self-administer medications as prescribed as evidenced by self-report/primary caregiver report  ?-Patient/Caregiver will attend all scheduled provider appointments as evidenced by clinician review of documented attendance to scheduled appointments and patient/caregiver report ?-Patient/Caregiver will call pharmacy for medication refills as evidenced by patient report and review of pharmacy fill history as appropriate ?-Patient/Caregiver will call provider office for new concerns or questions as evidenced by review of documented incoming telephone call notes and patient report ?-Patient/Caregiver verbalizes understanding of plan ?-Patient/Caregiver will focus on medication adherence by taking medications as prescribed ?-Monitor his Diet and meal prep when he is able to eat the correct foods ?-check blood sugar at prescribed times ?-check blood sugar if I feel it is too high or too low ?-record values and write them down take them to all doctor visits  ?-eat meals to maintain blood sugar control. ?  ? ?Juanell Fairly RN, BSN, CPC ?Care Management Coordinator ? Family Medicine  ?Phone: [phone]  ?  ? ? ? ? ? ? ?

## 2021-05-29 NOTE — Patient Instructions (Signed)
It was wonderful to see you today. ? ?Please bring ALL of your medications with you to every visit.  ? ?Today we talked about: ? ?-Your A1c today was 8.5 ?-I would like you to increase your Lantus to 12U daily.  ?-If you have sugar readings less than 60 OR if your sugar readings are slightly higher but you feel weak, clammy, or lightheaded, please let me know.  ?-Return in 3 months. ?-Please try to make a conscious effort to exercise at least 30 minutes a day, 5 times per week. This could be anything that gets your heart rate up, such as walking.  ?-Do your best to limit portions when you have sugar craving, see below for other dietary recommendations. ?-I have sent a referral to an ophthalmologist for an eye exam. ?-We did a urine test today to check on your kidneys.  ?-I would encourage you to follow up with your kidney doctor.  ? ? ?Diet Recommendations for Diabetes  ?Carbohydrate includes starch, sugar, and fiber.  Of these, only sugar and starch raise blood glucose.  (Fiber is found in fruits, vegetables [especially skin, seeds, and stalks], whole grains, and beans.)   ?Starchy (carb) foods: Bread, rice, pasta, potatoes, corn, cereal, grits, crackers, bagels, muffins, all baked goods.  (Fruit, milk, and yogurt also have carbohydrate, but most of these foods will not spike your blood sugar as most starchy or sweet foods will.)  A few fruits do cause high blood sugars; use small portions of bananas (limit to 1/2 at a time), grapes, watermelon, and oranges.   ?Protein foods: Meat, fish, poultry, eggs, dairy foods, and beans such as pinto and kidney beans (beans also provide carbohydrate).  ? ?1. Eat at least 3 REAL meals and 1-2 snacks per day. Eat breakfast within the first hour of getting up.  Have something to eat at least every 5 hours while awake.   ?2. Limit starchy foods to TWO per meal and ONE per snack. ONE portion of a starchy food is equal to the following:  ? - ONE slice of bread (or its equivalent,  such as half of a hamburger bun).  ? - 1/2 cup of a "scoopable" starchy food such as potatoes or rice.  ? - 15 grams of Total Carbohydrate as shown on food label.  ? - Every 4 ounces of a sweet drink (including fruit juice). ?3. Include twice the volume of vegetables as protein or carbohydrate foods for BOTH lunch and dinner as often as you can.  ? - Fresh or frozen vegetables are best.  ? - Keep frozen vegetables on hand for a quick option.    ? ?  ? ? ? ?Thank you for choosing [facility].  ? ?Please call [phone] with any questions about today's appointment. ? ?Please be sure to schedule follow up at the front  desk before you leave today.  ? ?Sabino Dick, DO ?PGY-2 Family Medicine   ?

## 2021-05-29 NOTE — Progress Notes (Signed)
? ? ?  SUBJECTIVE:  ? ?CHIEF COMPLAINT / HPI:  ? ?Diabetes, Type 2 ?- Last A1c 7.5 in December  ?- Medications: Metformin 1500 mg, Lantus 10 U, Jardiance 25 mg  ?- Compliance: Good ?- Checking BG at home: Checks daily, glucometer shows range 64-336 in March  ?- Diet: Still having sweet cravings  ?- Exercise: Has not been exercising. Is thinking about walking ?- Eye exam: States he will call for an appointment ?- Foot exam: Up to date ?- Microalbumin: Due ?- Statin: Atorvastatin 40 mg  ?- Denies symptoms of hypoglycemia, polyuria, polydipsia, numbness extremities, foot ulcers/trauma ?-Needs refills on test strips.  ? ?CKD Stage 3b ?Follows with [facility]. States that he was unable to be seen at his last Nephrology appointment last month given that he was 3 minutes over their late policy. He is unsure if he wants to reschedule or try to find another kidney doctor.  ? ?PERTINENT  PMH / PSH:  ?Past Medical History:  ?Diagnosis Date  ? Diabetes mellitus without complication ([place])   ? Substance abuse ([place])   ? hx alcohol (quit 1982) and tobacco (quit 1995)  ? ?OBJECTIVE:  ? ?BP (!) 128/58   Pulse 86   Wt 123 lb 12.8 oz (56.2 kg)   SpO2 100%   BMI 19.98 kg/m?   ? ?General: NAD, pleasant, able to participate in exam ?Extremities: no edema or cyanosis. ?Foot exam: No deformities, ulcerations, or other skin breakdown on feet bilaterally.  Sensation intact to touch.  PT and DP pulses intact BL.   ?Skin: warm and dry, no rashes noted ?Psych: Normal affect and mood ? ?ASSESSMENT/PLAN:  ? ?Controlled diabetes mellitus type 2 with complications ([place]) ?A1c slightly worsened today at 8.5. Has room to improve with exercise and diet. He is on the thinner side so I'm hesistant to add GLP-1. Renal function limits further increase of Metformin. ?-Increase Lantus to 12U; being cautious as to try to avoid hypoglycemia ?-Continue other medications: Metformin, Jardiance ?-Eye exam due in May; referral placed today ?-Continue statin  and ARB  ?-Urine microalbumin today ?-Foot exam unremarkable ? ?CKD (chronic kidney disease) stage 3, GFR 30-59 ml/min ?Encouraged patient call to reschedule appointment at [facility]. Encouraged him to arrive early to his appointment time so that he does not risk going up against the late policy. He seemed agreeable. Last BMP 01/2021, creatinine 1.93 (BL ~1.8), GFR 37.  ?-Urine microalbumin as above ?-Continue jardiance, sodium bicarb, ARB ?  ? ?Sharion Settler, DO ?[place]  ? ?

## 2021-05-31 ENCOUNTER — Ambulatory Visit (INDEPENDENT_AMBULATORY_CARE_PROVIDER_SITE_OTHER): Payer: Medicare Other | Admitting: Family Medicine

## 2021-05-31 ENCOUNTER — Other Ambulatory Visit: Payer: Self-pay

## 2021-05-31 VITALS — BP 128/58 | HR 86 | Wt 123.8 lb

## 2021-05-31 DIAGNOSIS — Z794 Long term (current) use of insulin: Secondary | ICD-10-CM | POA: Diagnosis not present

## 2021-05-31 DIAGNOSIS — E118 Type 2 diabetes mellitus with unspecified complications: Secondary | ICD-10-CM | POA: Diagnosis not present

## 2021-05-31 DIAGNOSIS — N1832 Chronic kidney disease, stage 3b: Secondary | ICD-10-CM

## 2021-05-31 LAB — POCT GLYCOSYLATED HEMOGLOBIN (HGB A1C): HbA1c, POC (controlled diabetic range): 8.5 % — AB (ref 0.0–7.0)

## 2021-05-31 MED ORDER — GLUCOSE BLOOD VI STRP: ORAL_STRIP | 3 refills | Status: DC

## 2021-05-31 MED ORDER — GLUCOSE BLOOD VI STRP: ORAL_STRIP | 12 refills | Status: DC

## 2021-05-31 NOTE — Assessment & Plan Note (Addendum)
Encouraged patient call to reschedule appointment at [facility]. Encouraged him to arrive early to his appointment time so that he does not risk going up against the late policy. He seemed agreeable. Last BMP 01/2021, creatinine 1.93 (BL ~1.8), GFR 37.  ?-Urine microalbumin as above ?-Continue jardiance, sodium bicarb, ARB ?

## 2021-05-31 NOTE — Assessment & Plan Note (Addendum)
A1c slightly worsened today at 8.5. Has room to improve with exercise and diet. He is on the thinner side so I'm hesistant to add GLP-1. Renal function limits further increase of Metformin. ?-Increase Lantus to 12U; being cautious as to try to avoid hypoglycemia ?-Continue other medications: Metformin, Jardiance ?-Eye exam due in May; referral placed today ?-Continue statin and ARB  ?-Urine microalbumin today ?-Foot exam unremarkable ?

## 2021-06-01 LAB — MICROALBUMIN / CREATININE URINE RATIO
Creatinine, Urine: 70.3 mg/dL
Microalb/Creat Ratio: 183 mg/g creat — ABNORMAL HIGH (ref 0–29)
Microalbumin, Urine: 128.6 ug/mL

## 2021-06-02 ENCOUNTER — Other Ambulatory Visit: Payer: Self-pay

## 2021-06-02 MED ORDER — ACCU-CHEK GUIDE VI STRP: ORAL_STRIP | 0 refills | Status: DC

## 2021-06-02 NOTE — Telephone Encounter (Signed)
Patient calls nurse line reporting the wrong test strips were sent in on 4/3. ? ?Patient reports his meter is for Accu Chek Guide. ? ?Will send in Accu Check Guide testing strips.  ?

## 2021-06-03 MED ORDER — ACCU-CHEK GUIDE VI STRP: ORAL_STRIP | 0 refills | Status: DC

## 2021-06-03 NOTE — Addendum Note (Signed)
Addended by: Steva Colder on: 06/03/2021 12:08 PM ? ? Modules accepted: Orders ? ?

## 2021-06-21 ENCOUNTER — Ambulatory Visit: Payer: Medicare Other

## 2021-06-21 NOTE — Chronic Care Management (AMB) (Signed)
?Chronic Care Management  ? ?CCM RN Visit Note ? ?06/21/2021 ?Name: Jordan Rodriguez MRN: [id number] DOB: 1950/10/24 ? ?Subjective: ?Jordan Rodriguez is a 71 y.o. year old male who is a primary care patient of Sharion Settler, DO. The care management team was consulted for assistance with disease management and care coordination needs.   ? ?Engaged with patient by telephone for follow up visit in response to provider referral for case management and/or care coordination services.  ? ?Consent to Services:  ?The patient was given information about Chronic Care Management services, agreed to services, and gave verbal consent prior to initiation of services.  Please see initial visit note for detailed documentation.  ? ?Patient agreed to services and verbal consent obtained.  ? ? ?Summary:  The patient continues to maintain positive progress with care plan goals. His A1c went up some but he knows what he has to do t make changes.See Care Plan below for interventions and patient self-care actives. ? ?Recommendation: The patient may benefit from taking medications as prescribed, exercising as tolerated, monitoring food intake, checking Blood sugar and record values, calling your physician if numbers are abnormal, and The patient agrees. ? ?Follow up Plan: Patient would like continued follow-up.  CCM RNCM will outreach the patient within the next 5 weeks.  Patient will call office if needed prior to next encounter ? ? ?Assessment: Review of patient past medical history, allergies, medications, health status, including review of consultants reports, laboratory and other test data, was performed as part of comprehensive evaluation and provision of chronic care management services.  ? ?SDOH (Social Determinants of Health) assessments and interventions performed:   ? ?CCM Care Plan ? ?No Known Allergies ? ?Outpatient Encounter Medications as of 06/21/2021  ?Medication Sig Note  ? Accu-Chek Softclix Lancets lancets  SMARTSIG:1 Topical 4 Times Daily   ? Accu-Chek Softclix Lancets lancets Use as instructed   ? aspirin EC 81 MG tablet Take 1 tablet (81 mg total) by mouth daily.   ? atorvastatin (LIPITOR) 40 MG tablet Take 1 tablet (40 mg total) by mouth daily.   ? blood glucose meter kit and supplies KIT Dispense based on patient and insurance preference. Use up to four times daily as directed. (FOR ICD-9 250.00, 250.01).   ? empagliflozin (JARDIANCE) 25 MG TABS tablet Take 1 tablet (25 mg total) by mouth daily.   ? glucose blood (ACCU-CHEK GUIDE) test strip Use to check blood sugars 3x per day.Dx code:E11.65   ? insulin glargine (LANTUS) 100 UNIT/ML Solostar Pen Inject 10 Units into the skin every morning. (Patient taking differently: Inject 12 Units into the skin every morning.) 05/31/2021: Taking 10U  ? Insulin Pen Needle 32G X 4 MM MISC 1 Container by Does not apply route 3 (three) times daily.   ? Lancet Devices (ACCU-CHEK SOFTCLIX) lancets Check blood glucose up to 3 times daily.   ? losartan (COZAAR) 50 MG tablet Take 50 mg by mouth at bedtime. 02/08/2021: Supposed to be 50 mg now  ? metFORMIN (GLUCOPHAGE) 500 MG tablet TAKE 3 TABLETS BY MOUTH ONCE DAILY WITH BREAKFAST   ? sildenafil (VIAGRA) 50 MG tablet Take 1 tablet (50 mg total) by mouth daily as needed for erectile dysfunction.   ? sodium bicarbonate 650 MG tablet Take 650 mg by mouth 2 (two) times daily.   ? ?No facility-administered encounter medications on file as of 06/21/2021.  ? ? ?Patient Active Problem List  ? Diagnosis Date Noted  ? Needs flu shot 02/08/2021  ?  Hyperkalemia 05/25/2020  ? Muscle cramps 11/11/2019  ? Erectile dysfunction 11/11/2019  ? Advance directive discussed with patient 07/15/2019  ? CKD (chronic kidney disease) stage 3, GFR 30-59 ml/min (HCC) 06/26/2019  ? Hyperlipidemia 12/24/2015  ? Diverticulosis of colon 10/29/2013  ? Controlled diabetes mellitus type 2 with complications ([place]) 38/18/2993  ? ? ?Conditions to be  addressed/monitored:DMII ? ?Care Plan : RN Case Manager  ?Updates made by Lazaro Arms, RN since 06/21/2021 12:00 AM  ?  ? ?Problem: Glycemic Management (Diabetes, Type 2)   ?  ? ?Long-Range Goal: Patient will monitor and manage diabetes with medication and food intake.   ?Start Date: 06/23/2020  ?Expected End Date: 10/28/2021  ?Recent Progress: On track  ?Priority: High  ?Note:   ? ?Current Barriers:  ?Knowledge Deficits related to basic Diabetes pathophysiology and self care/management ? ?Case Manager Clinical Goal(s):  ?Over the next 90 days, patient will demonstrate improved adherence to prescribed treatment plan for diabetes self care/management as evidenced by: lowering his a1c by 1-2 points. ? ?Interventions: ?1:1 collaboration with primary care provider regarding development and update of comprehensive plan of care as evidenced by provider attestation and co-signature ?Inter-disciplinary care team collaboration (see longitudinal plan of care) ?Evaluation of current treatment plan related to  self management and patient's adherence to plan as established by provider ? ? ?Diabetes:  (Status: Goal on Track (progressing): YES.) Long Term Goal  ? ?Lab Results  ?Component Value Date  ? HGBA1C 8.5 (A) 05/31/2021  ?Assessed patient's understanding of A1c goal: <7% ? ?Reviewed medications with patient and discussed importance of medication adherence.   Patient continues to be compliant with medications. ?Discussed plans with patient for ongoing care management follow up and provided patient with direct contact information for care management team ?Review of patient status, including review of consultants reports, relevant laboratory and other test results, and medications completed. ?blood glucose monitoring encouraged  ?blood glucose readings reviewed.  ?06/21/21: The patient attended his appointment on 05/31/21, and his A1C went from 7.5 to 8.5.  His Laantus was increased to 12 units; he will continue his metformin and  jardiance.  He is walking for exercise and trying to do 30 minutes a day.  I expressed the importance of him eating regularly and including some protein in his diet.  We also reviewed the hypoglycemic protocol.  His blood sugar this morning was 89.  He had a low of 67 about two weeks ago.  I will send him a sheet about foods to eat. ? ? ?Patient Goals/Self Care Activities: ?-Patient/Caregiver will self-administer medications as prescribed as evidenced by self-report/primary caregiver report  ?-Patient/Caregiver will attend all scheduled provider appointments as evidenced by clinician review of documented attendance to scheduled appointments and patient/caregiver report ?-Patient/Caregiver will call pharmacy for medication refills as evidenced by patient report and review of pharmacy fill history as appropriate ?-Patient/Caregiver will call provider office for new concerns or questions as evidenced by review of documented incoming telephone call notes and patient report ?-Patient/Caregiver verbalizes understanding of plan ?-Patient/Caregiver will focus on medication adherence by taking medications as prescribed ?-Monitor his Diet and meal prep when he is able to eat the correct foods ?-check blood sugar at prescribed times ?-check blood sugar if I feel it is too high or too low ?-record values and write them down take them to all doctor visits  ?-eat meals to maintain blood sugar control. ?  ? ?Lazaro Arms RN, BSN, [place] ?Care Management Coordinator ?[place]  ?  Phone: [phone]  ?  ? ? ? ? ? ? ? ?

## 2021-06-21 NOTE — Patient Instructions (Signed)
Visit Information ? ?Mr. Joubert  it was nice speaking with you. Please call me directly [phone] if you have questions about the goals we discussed. ? ?Patient Goals/Self Care Activities: ?-Patient/Caregiver will self-administer medications as prescribed as evidenced by self-report/primary caregiver report  ?-Patient/Caregiver will attend all scheduled provider appointments as evidenced by clinician review of documented attendance to scheduled appointments and patient/caregiver report ?-Patient/Caregiver will call pharmacy for medication refills as evidenced by patient report and review of pharmacy fill history as appropriate ?-Patient/Caregiver will call provider office for new concerns or questions as evidenced by review of documented incoming telephone call notes and patient report ?-Patient/Caregiver verbalizes understanding of plan ?-Patient/Caregiver will focus on medication adherence by taking medications as prescribed ?-Monitor his Diet and meal prep when he is able to eat the correct foods ?-check blood sugar at prescribed times ?-check blood sugar if I feel it is too high or too low ?-record values and write them down take them to all doctor visits  ?-eat meals to maintain blood sugar control. ?   ?  ? ? ? ?The patient verbalized understanding of instructions, educational materials, and care plan provided today and agreed to receive a mailed copy of patient instructions, educational materials, and care plan.  ? ?Follow up Plan: Patient would like continued follow-up.  CCM RNCM will outreach the patient within the next 5 weeks.  Patient will call office if needed prior to next encounter ? ?Lazaro Arms, RN ? ?[phone]  ?

## 2021-06-23 ENCOUNTER — Other Ambulatory Visit: Payer: Self-pay | Admitting: Family Medicine

## 2021-06-23 MED ORDER — INSULIN GLARGINE 100 UNIT/ML SOLOSTAR PEN: 12.0000 [IU] | PEN_INJECTOR | SUBCUTANEOUS | 1 refills | Status: DC

## 2021-07-03 ENCOUNTER — Other Ambulatory Visit: Payer: Self-pay | Admitting: Family Medicine

## 2021-07-05 ENCOUNTER — Other Ambulatory Visit: Payer: Self-pay

## 2021-07-05 MED ORDER — ASPIRIN EC 81 MG PO TBEC: 81.0000 mg | DELAYED_RELEASE_TABLET | Freq: Every day | ORAL | 3 refills | Status: AC

## 2021-07-07 DIAGNOSIS — E872 Acidosis, unspecified: Secondary | ICD-10-CM | POA: Diagnosis not present

## 2021-07-07 DIAGNOSIS — E785 Hyperlipidemia, unspecified: Secondary | ICD-10-CM | POA: Diagnosis not present

## 2021-07-07 DIAGNOSIS — E875 Hyperkalemia: Secondary | ICD-10-CM | POA: Diagnosis not present

## 2021-07-07 DIAGNOSIS — E1122 Type 2 diabetes mellitus with diabetic chronic kidney disease: Secondary | ICD-10-CM | POA: Diagnosis not present

## 2021-07-07 DIAGNOSIS — N1832 Chronic kidney disease, stage 3b: Secondary | ICD-10-CM | POA: Diagnosis not present

## 2021-07-07 DIAGNOSIS — N2581 Secondary hyperparathyroidism of renal origin: Secondary | ICD-10-CM | POA: Diagnosis not present

## 2021-07-07 DIAGNOSIS — I129 Hypertensive chronic kidney disease with stage 1 through stage 4 chronic kidney disease, or unspecified chronic kidney disease: Secondary | ICD-10-CM | POA: Diagnosis not present

## 2021-07-07 DIAGNOSIS — D631 Anemia in chronic kidney disease: Secondary | ICD-10-CM | POA: Diagnosis not present

## 2021-07-07 DIAGNOSIS — R809 Proteinuria, unspecified: Secondary | ICD-10-CM | POA: Diagnosis not present

## 2021-07-21 ENCOUNTER — Ambulatory Visit: Payer: Medicare Other

## 2021-07-22 NOTE — Patient Instructions (Signed)
Visit Information  Jordan Rodriguez  it was nice speaking with you. Please call me directly [phone] if you have questions about the goals we discussed.  Patient Goals/Self Care Activities: -Patient/Caregiver will self-administer medications as prescribed as evidenced by self-report/primary caregiver report  -Patient/Caregiver will attend all scheduled provider appointments as evidenced by clinician review of documented attendance to scheduled appointments and patient/caregiver report -Patient/Caregiver will call pharmacy for medication refills as evidenced by patient report and review of pharmacy fill history as appropriate -Patient/Caregiver will call provider office for new concerns or questions as evidenced by review of documented incoming telephone call notes and patient report -Patient/Caregiver verbalizes understanding of plan -Patient/Caregiver will focus on medication adherence by taking medications as prescribed -Monitor his Diet and meal prep when he is able to eat the correct foods -check blood sugar at prescribed times -check blood sugar if I feel it is too high or too low -record values and write them down take them to all doctor visits  -eat meals to maintain blood sugar control.   The patient verbalized understanding of instructions, educational materials, and care plan provided today and agreed to receive a mailed copy of patient instructions, educational materials, and care plan.   Follow up Plan: Patient would like continued follow-up.  CCM RNCM will outreach the patient within the next 4 weeks.  Patient will call office if needed prior to next encounter  Lazaro Arms, RN  [phone]

## 2021-07-22 NOTE — Chronic Care Management (AMB) (Signed)
Chronic Care Management   CCM RN Visit Note  07/22/2021 Name: Jordan Rodriguez MRN: [id number] DOB: 1950/10/30  Subjective: Jordan Rodriguez is a 71 y.o. year old male who is a primary care patient of Jordan Dick, DO. The care management team was consulted for assistance with disease management and care coordination needs.    Engaged with patient by telephone for follow up visit in response to provider referral for case management and/or care coordination services.   Consent to Services:  The patient was given information about Chronic Care Management services, agreed to services, and gave verbal consent prior to initiation of services.  Please see initial visit note for detailed documentation.   Patient agreed to services and verbal consent obtained.    Summary:  The patient is making progress with diabetes but still has some elevate numbers and a few lows.  We have discussed diet and snacks. . See Care Plan below for interventions and patient self-care actives.  Recommendation: The patient may benefit from taking medications as prescribed, exercising as tolerated, monitoring food intake, checking Blood sugar and record values, calling your physician if numbers are abnormal, and The patient agrees.  Follow up Plan: Patient would like continued follow-up.  CCM RNCM will outreach the patient within the next 4 weeks.  Patient will call office if needed prior to next encounter   Assessment: Review of patient past medical history, allergies, medications, health status, including review of consultants reports, laboratory and other test data, was performed as part of comprehensive evaluation and provision of chronic care management services.   SDOH (Social Determinants of Health) assessments and interventions performed:  No  CCM Care Plan  Conditions to be addressed/monitored:DMII  Care Plan : RN Case Manager  Updates made by Juanell Fairly, RN since 07/22/2021 12:00 AM      Problem: Glycemic Management (Diabetes, Type 2)      Long-Range Goal: Patient will monitor and manage diabetes with medication and food intake.   Start Date: 06/23/2020  Expected End Date: 10/28/2021  Recent Progress: On track  Priority: High  Note:    Current Barriers:  Knowledge Deficits related to basic Diabetes pathophysiology and self care/management  Case Manager Clinical Goal(s):  Over the next 90 days, patient will demonstrate improved adherence to prescribed treatment plan for diabetes self care/management as evidenced by: lowering his a1c by 1-2 points.  Interventions: 1:1 collaboration with primary care provider regarding development and update of comprehensive plan of care as evidenced by provider attestation and co-signature Inter-disciplinary care team collaboration (see longitudinal plan of care) Evaluation of current treatment plan related to  self management and patient's adherence to plan as established by provider   Diabetes:  (Status: Goal on Track (progressing): YES.) Long Term Goal   Lab Results  Component Value Date   HGBA1C 8.5 (A) 05/31/2021  Assessed patient's understanding of A1c goal: <7%  Reviewed medications with patient and discussed importance of medication adherence.   Patient continues to be compliant with medications. Discussed plans with patient for ongoing care management follow up and provided patient with direct contact information for care management team Review of patient status, including review of consultants reports, relevant laboratory and other test results, and medications completed. blood glucose monitoring encouraged  blood glucose readings reviewed.  07/21/21: I spoke with Jordan Rodriguez, and he reported that his blood sugar levels range between 77 and 183, with a high of 283 and two lows of 58 and 67. We discussed the importance of  maintaining a healthy diet and not skipping meals. He indicated that he understood the importance of  this. Additionally, we discussed his upcoming diabetic eye exam, and he provided the contact information for [facility], which he preferred. During our call with Shanda Bumps from the clinic, we learned that Jordan Rodriguez had his last exam in September and is due for his next one at the end of September this year. Luckily, his insurance will cover the exam cost at [facility]. I sent the information to the office and let them know that this is where he prefers to go.  Patient Goals/Self Care Activities: -Patient/Caregiver will self-administer medications as prescribed as evidenced by self-report/primary caregiver report  -Patient/Caregiver will attend all scheduled provider appointments as evidenced by clinician review of documented attendance to scheduled appointments and patient/caregiver report -Patient/Caregiver will call pharmacy for medication refills as evidenced by patient report and review of pharmacy fill history as appropriate -Patient/Caregiver will call provider office for new concerns or questions as evidenced by review of documented incoming telephone call notes and patient report -Patient/Caregiver verbalizes understanding of plan -Patient/Caregiver will focus on medication adherence by taking medications as prescribed -Monitor his Diet and meal prep when he is able to eat the correct foods -check blood sugar at prescribed times -check blood sugar if I feel it is too high or too low -record values and write them down take them to all doctor visits  -eat meals to maintain blood sugar control.    Juanell Fairly RN, BSN, [facility] Care Management Coordinator [facility]  Phone: [phone]

## 2021-07-28 ENCOUNTER — Encounter: Payer: Self-pay | Admitting: Family Medicine

## 2021-07-28 LAB — HM DIABETES EYE EXAM

## 2021-08-20 ENCOUNTER — Ambulatory Visit: Payer: Medicare Other

## 2021-08-29 ENCOUNTER — Other Ambulatory Visit: Payer: Self-pay | Admitting: Family Medicine

## 2021-09-06 ENCOUNTER — Other Ambulatory Visit: Payer: Self-pay | Admitting: Family Medicine

## 2021-09-09 NOTE — Progress Notes (Unsigned)
    SUBJECTIVE:   CHIEF COMPLAINT / HPI:   Diabetes, Type 2 - Last A1c 8.5 in April - Medications: Metformin 1500 mg, Lantus 12 units, Jardiance 25 mg - Compliance: Great - Checking BG at home: Checks 3 times a day, has had a few lows in July 58-66. Mostly ranging in 100s, a few high readings >200. - Diet: Still has some sweet cravings. - Exercise: Working on trying to do more - Eye exam: Scheduled for September - Foot exam: Due - Microalbumin: UTD - Statin: On Atorvastatin 40 mg   PERTINENT  PMH / PSH:  Past Medical History:  Diagnosis Date   Diabetes mellitus without complication (HCC)    Substance abuse (HCC)    hx alcohol (quit 1982) and tobacco (quit 1995)   OBJECTIVE:   BP (!) 106/58   Pulse 62   Wt 123 lb (55.8 kg)   SpO2 98%   BMI 19.85 kg/m    General: NAD, pleasant, able to participate in exam Respiratory: Normal respiratory effort Foot exam: No deformities, ulcerations, or other skin breakdown on feet bilaterally.  Sensation intact to monofilament and light touch.  PT and DP pulses intact BL.   Skin: warm and dry, no rashes noted Psych: Normal affect and mood  ASSESSMENT/PLAN:   Controlled diabetes mellitus type 2 with complications (HCC) A1c 7.9, slightly improved today from previous. Upon review of CBG, patient has had a few lows this month. He has history of low sugars as well. Given his age and hx of hypoglycemia, will make his A1c goal <8.  -Decrease Lantus back to 10U daily -Continue Metformin and Jardiance -Avoiding GLP-1 given his BMI is 19  -Continue statin, ARB -Foot exam completed today -Ophthalmology exam in September  Hyperlipidemia Stable, continue statin.    Sabino Dick, DO [place] [facility]

## 2021-09-20 NOTE — Patient Instructions (Signed)
It was wonderful to see you today.  Please bring ALL of your medications with you to every visit.   Today we talked about:  -Your A1c was 7.9 today. This is slight improvement from the last time! - Medication changes: Go down to 10U of Lantus each day.  -Follow up with me in 3 months.   Diet Recommendations for Diabetes  Carbohydrate includes starch, sugar, and fiber.  Of these, only sugar and starch raise blood glucose.  (Fiber is found in fruits, vegetables [especially skin, seeds, and stalks], whole grains, and beans.)   Starchy (carb) foods: Bread, rice, pasta, potatoes, corn, cereal, grits, crackers, bagels, muffins, all baked goods.  (Fruit, milk, and yogurt also have carbohydrate, but most of these foods will not spike your blood sugar as most starchy or sweet foods will.)  A few fruits do cause high blood sugars; use small portions of bananas (limit to 1/2 at a time), grapes, watermelon, and oranges.   Protein foods: Meat, fish, poultry, eggs, dairy foods, and beans such as pinto and kidney beans (beans also provide carbohydrate).   1. Eat at least 3 REAL meals and 1-3 snacks per day.  Eat breakfast within one hour of getting up.  Have something to eat at least every 5 hours while awake.  - A REAL meal for lunch or dinner includes at least some protein, some starch, and vegetables and/or fruit.   - A REAL breakfast needs to include both starch and protein foods.     2. Limit starchy foods to TWO portions per meal and ONE per snack. ONE portion of a starchy food is equal to the following:  - ONE slice of bread (or its equivalent, such as half of a hamburger bun).  - 1/2 cup of a "scoopable" starchy food such as potatoes or rice.  - 15 grams of Total Carbohydrate as shown on food label.  - 4 ounces of a sweet drink (including fruit juice).  3. Include twice the volume of vegetables as protein or carbohydrate foods for BOTH lunch and dinner.. - Fresh or frozen vegetables are best.  -  Keep frozen vegetables on hand for a quick option.          Thank you for choosing [facility].   Please call [phone] with any questions about today's appointment.  Please be sure to schedule follow up at the front  desk before you leave today.   Sabino Dick, DO PGY-3 Family Medicine

## 2021-09-21 ENCOUNTER — Ambulatory Visit (INDEPENDENT_AMBULATORY_CARE_PROVIDER_SITE_OTHER): Payer: Medicare Other | Admitting: Family Medicine

## 2021-09-21 ENCOUNTER — Encounter: Payer: Self-pay | Admitting: Family Medicine

## 2021-09-21 VITALS — BP 106/58 | HR 62 | Wt 123.0 lb

## 2021-09-21 DIAGNOSIS — E118 Type 2 diabetes mellitus with unspecified complications: Secondary | ICD-10-CM | POA: Diagnosis not present

## 2021-09-21 DIAGNOSIS — Z794 Long term (current) use of insulin: Secondary | ICD-10-CM | POA: Diagnosis not present

## 2021-09-21 DIAGNOSIS — E7800 Pure hypercholesterolemia, unspecified: Secondary | ICD-10-CM

## 2021-09-21 LAB — POCT GLYCOSYLATED HEMOGLOBIN (HGB A1C): HbA1c, POC (controlled diabetic range): 7.9 % — AB (ref 0.0–7.0)

## 2021-09-21 MED ORDER — EMPAGLIFLOZIN 25 MG PO TABS: 25.0000 mg | ORAL_TABLET | Freq: Every day | ORAL | 1 refills | Status: DC

## 2021-09-21 MED ORDER — METFORMIN HCL 500 MG PO TABS: 1500.0000 mg | ORAL_TABLET | Freq: Every day | ORAL | 3 refills | Status: DC

## 2021-09-21 NOTE — Assessment & Plan Note (Signed)
A1c 7.9, slightly improved today from previous. Upon review of CBG, patient has had a few lows this month. He has history of low sugars as well. Given his age and hx of hypoglycemia, will make his A1c goal <8.  -Decrease Lantus back to 10U daily -Continue Metformin and Jardiance -Avoiding GLP-1 given his BMI is 19  -Continue statin, ARB -Foot exam completed today -Ophthalmology exam in September

## 2021-09-21 NOTE — Assessment & Plan Note (Signed)
Stable, continue statin 

## 2021-10-04 ENCOUNTER — Other Ambulatory Visit: Payer: Self-pay | Admitting: Family Medicine

## 2021-11-09 DIAGNOSIS — E119 Type 2 diabetes mellitus without complications: Secondary | ICD-10-CM | POA: Diagnosis not present

## 2021-11-10 ENCOUNTER — Other Ambulatory Visit: Payer: Self-pay | Admitting: Family Medicine

## 2021-11-10 DIAGNOSIS — Z794 Long term (current) use of insulin: Secondary | ICD-10-CM

## 2021-11-11 DIAGNOSIS — D631 Anemia in chronic kidney disease: Secondary | ICD-10-CM | POA: Diagnosis not present

## 2021-11-11 DIAGNOSIS — N1832 Chronic kidney disease, stage 3b: Secondary | ICD-10-CM | POA: Diagnosis not present

## 2021-11-11 DIAGNOSIS — R809 Proteinuria, unspecified: Secondary | ICD-10-CM | POA: Diagnosis not present

## 2021-11-11 DIAGNOSIS — I129 Hypertensive chronic kidney disease with stage 1 through stage 4 chronic kidney disease, or unspecified chronic kidney disease: Secondary | ICD-10-CM | POA: Diagnosis not present

## 2021-11-11 DIAGNOSIS — N2581 Secondary hyperparathyroidism of renal origin: Secondary | ICD-10-CM | POA: Diagnosis not present

## 2021-11-11 DIAGNOSIS — E872 Acidosis, unspecified: Secondary | ICD-10-CM | POA: Diagnosis not present

## 2021-11-11 DIAGNOSIS — E1122 Type 2 diabetes mellitus with diabetic chronic kidney disease: Secondary | ICD-10-CM | POA: Diagnosis not present

## 2021-11-17 ENCOUNTER — Encounter: Payer: Self-pay | Admitting: Family Medicine

## 2021-11-17 LAB — HM DIABETES EYE EXAM

## 2021-12-02 DIAGNOSIS — N1832 Chronic kidney disease, stage 3b: Secondary | ICD-10-CM | POA: Diagnosis not present

## 2021-12-09 ENCOUNTER — Other Ambulatory Visit: Payer: Self-pay | Admitting: Family Medicine

## 2021-12-09 DIAGNOSIS — Z794 Long term (current) use of insulin: Secondary | ICD-10-CM

## 2022-01-04 IMAGING — US US RENAL
1 series · 14 of 25 positions shown · non-contrast
Comparison: None.

CLINICAL DATA: Chronic kidney disease

EXAM:
RENAL / URINARY TRACT ULTRASOUND COMPLETE

[Series 1: us renal · 0.23mm/px · 14 of 43 slices shown]
[im 1/43]
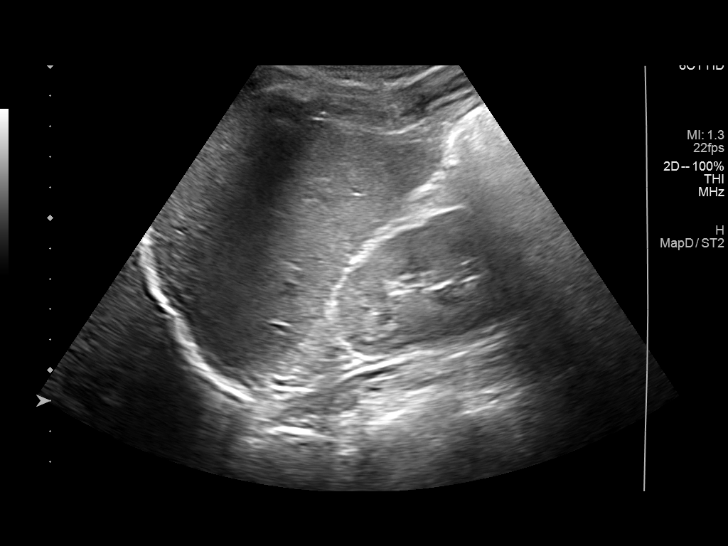
[im 4/43]
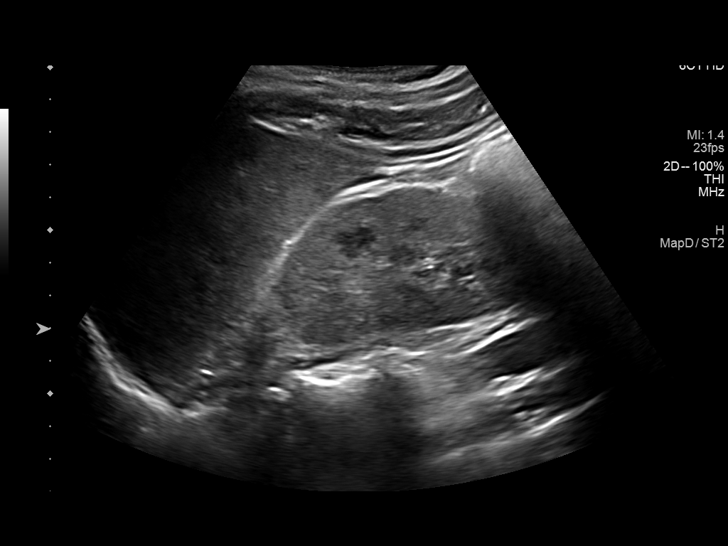
[im 8/43]
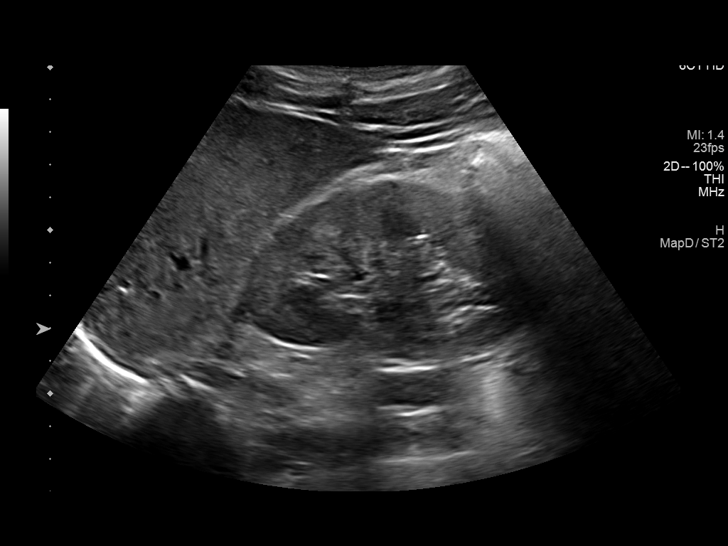
[im 11/43]
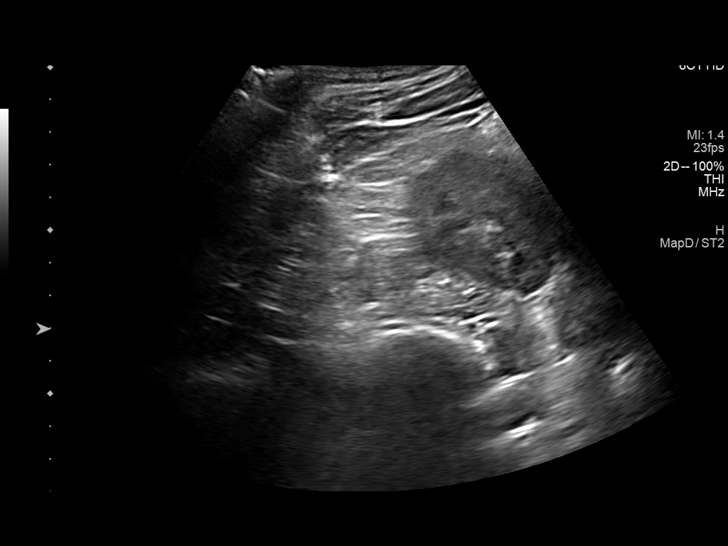
[im 15/43]
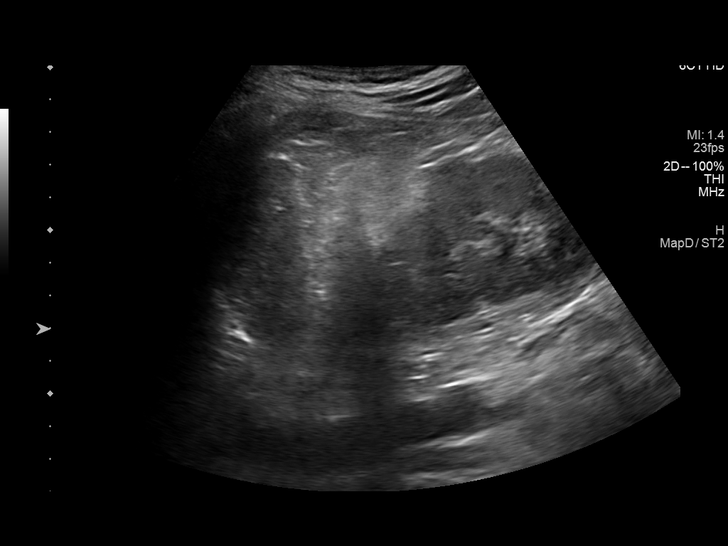
[im 16/43]
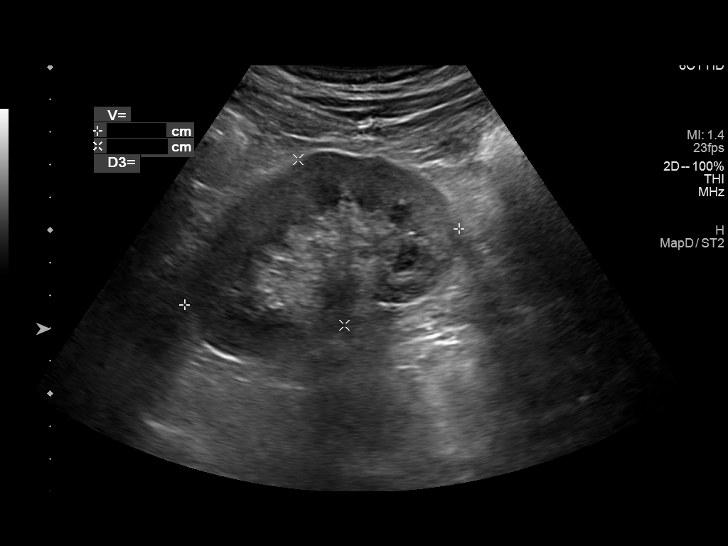
[im 20/43]
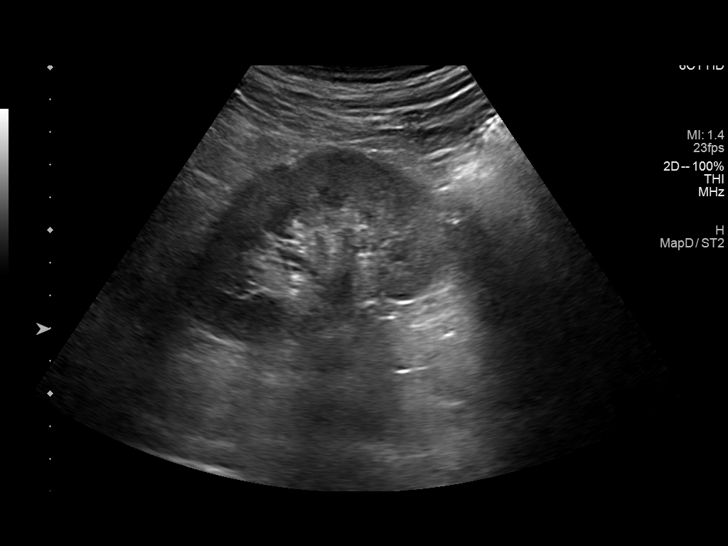
[im 23/43]
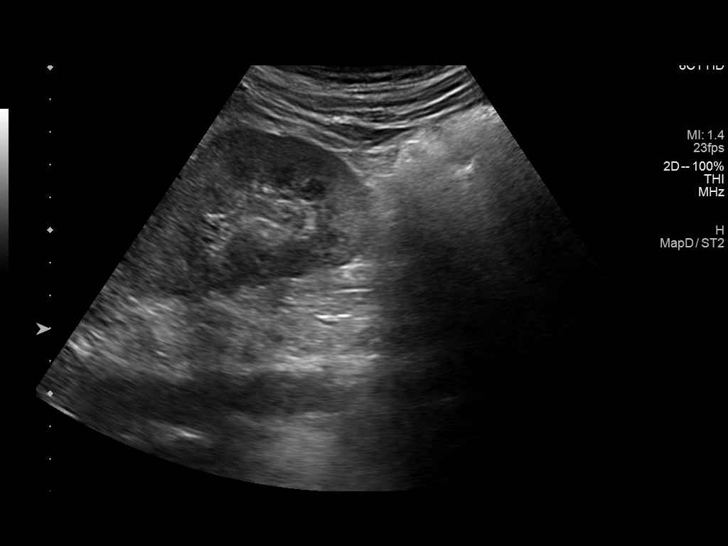
[im 27/43]
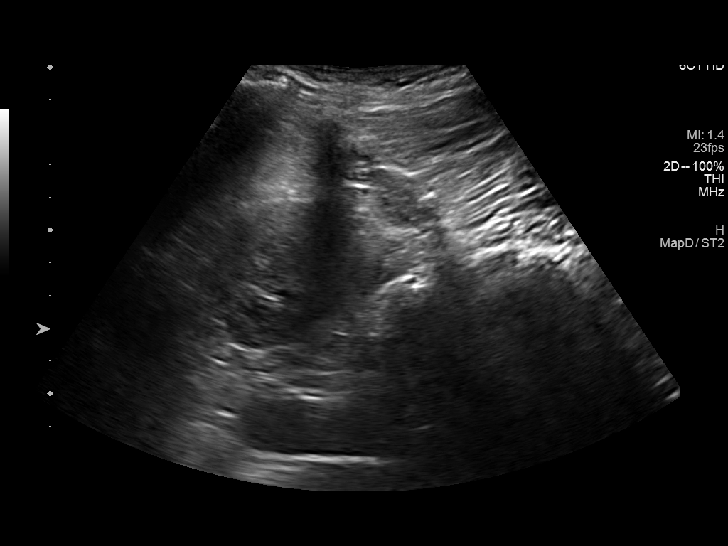
[im 29/43]
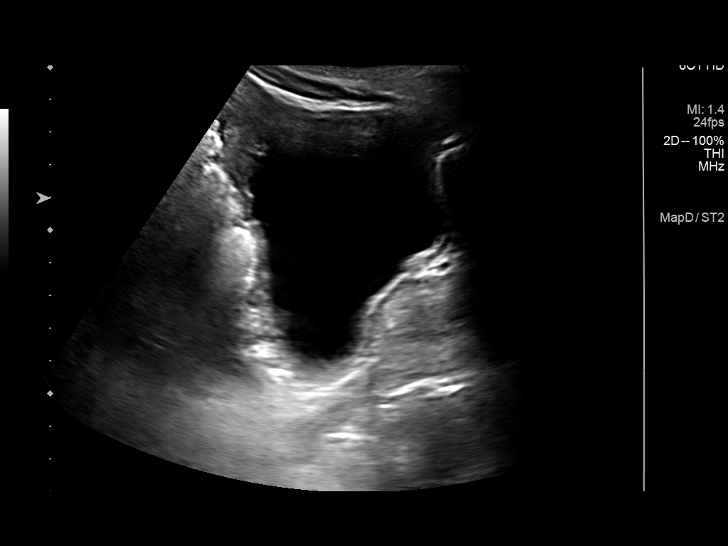
[im 32/43]
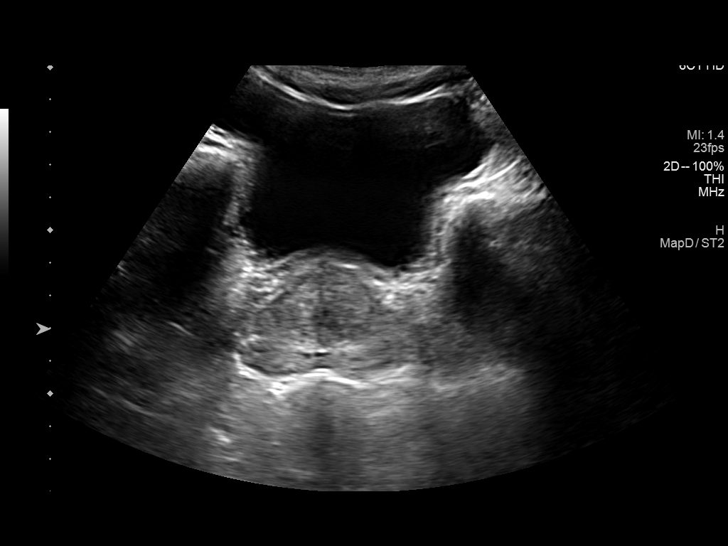
[im 36/43]
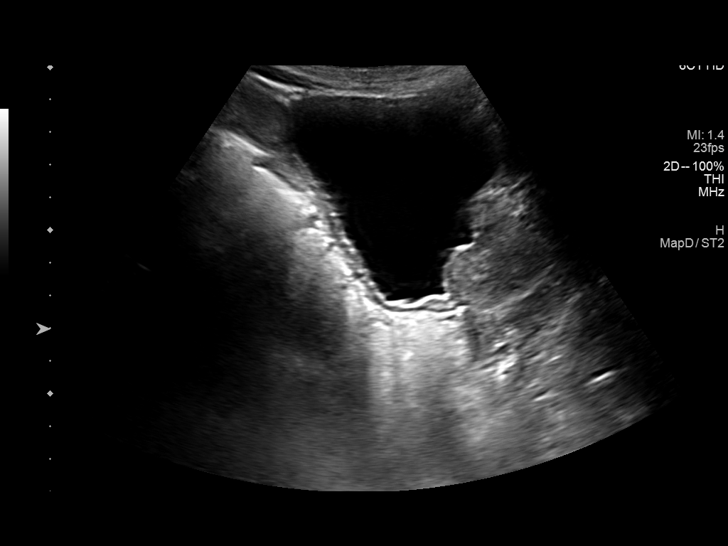
[im 39/43]
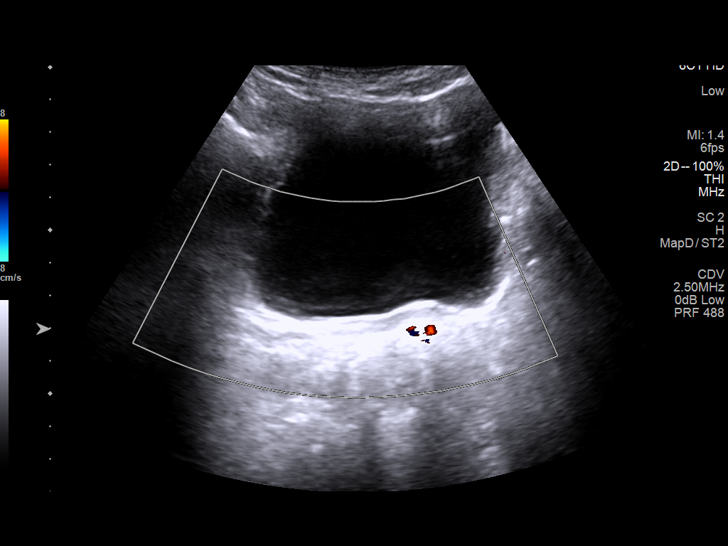
[im 43/43]
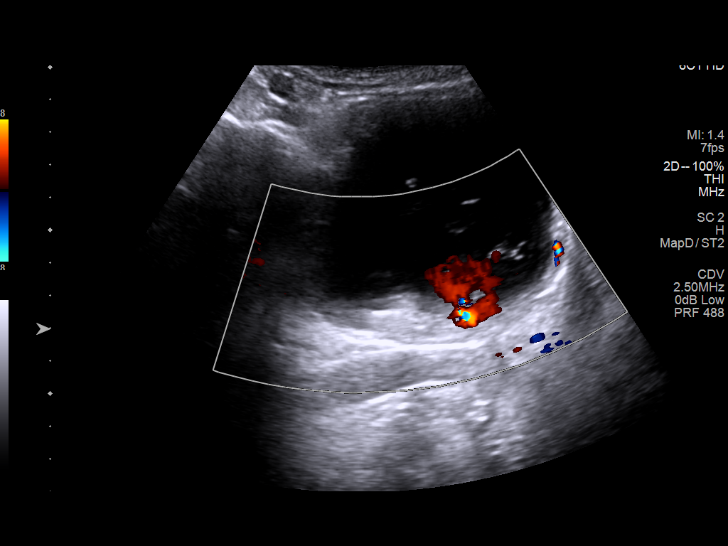

[14 of 25 positions shown; findings below may reference images not displayed]

FINDINGS: Right Kidney:

Renal measurements: 9.8 x 5.5 x 4 cm = volume: 113.1 mL. Cortex
appears slightly echogenic. No hydronephrosis or mass.

Left Kidney:

Renal measurements: 8.7 x 5.3 x 4.1 cm = volume: 97.4 mL. Cortex
appears slightly echogenic. No hydronephrosis or mass.

Bladder:

Appears normal for degree of bladder distention.

Other:

Prostate slightly enlarged measuring 5.1 x 3.9 x 3.3 cm.
IMPRESSION: 1. Kidneys are slightly echogenic consistent with medical renal
disease. No hydronephrosis.
2. Prostate appears slightly enlarged

## 2022-01-06 ENCOUNTER — Other Ambulatory Visit: Payer: Self-pay | Admitting: Family Medicine

## 2022-01-06 DIAGNOSIS — E118 Type 2 diabetes mellitus with unspecified complications: Secondary | ICD-10-CM

## 2022-02-10 ENCOUNTER — Ambulatory Visit (INDEPENDENT_AMBULATORY_CARE_PROVIDER_SITE_OTHER): Payer: Medicare Other | Admitting: Family Medicine

## 2022-02-10 ENCOUNTER — Encounter: Payer: Self-pay | Admitting: Family Medicine

## 2022-02-10 ENCOUNTER — Other Ambulatory Visit: Payer: Self-pay

## 2022-02-10 VITALS — BP 136/60 | HR 66 | Wt 131.0 lb

## 2022-02-10 DIAGNOSIS — E1159 Type 2 diabetes mellitus with other circulatory complications: Secondary | ICD-10-CM

## 2022-02-10 DIAGNOSIS — Z794 Long term (current) use of insulin: Secondary | ICD-10-CM

## 2022-02-10 DIAGNOSIS — E7800 Pure hypercholesterolemia, unspecified: Secondary | ICD-10-CM

## 2022-02-10 DIAGNOSIS — E1165 Type 2 diabetes mellitus with hyperglycemia: Secondary | ICD-10-CM

## 2022-02-10 DIAGNOSIS — E118 Type 2 diabetes mellitus with unspecified complications: Secondary | ICD-10-CM | POA: Diagnosis not present

## 2022-02-10 DIAGNOSIS — Z23 Encounter for immunization: Secondary | ICD-10-CM

## 2022-02-10 DIAGNOSIS — N1832 Chronic kidney disease, stage 3b: Secondary | ICD-10-CM | POA: Diagnosis not present

## 2022-02-10 DIAGNOSIS — I152 Hypertension secondary to endocrine disorders: Secondary | ICD-10-CM | POA: Diagnosis not present

## 2022-02-10 LAB — POCT GLYCOSYLATED HEMOGLOBIN (HGB A1C): HbA1c, POC (controlled diabetic range): 7.9 % — AB (ref 0.0–7.0)

## 2022-02-10 NOTE — Assessment & Plan Note (Signed)
Recheck lipid panel today. LDL goal <70.  -Continue Atorvastatin 40 mg daily

## 2022-02-10 NOTE — Patient Instructions (Addendum)
It was wonderful to see you today.  Please bring ALL of your medications with you to every visit.   Today we talked about:  Your A1c today was 7.9 which is not bad but we can do better!  Continue your medications for now.  Return for a repeat A1c in 3 months.   We are doing lab work today to check your kidney function electrolytes, and your cholesterol. I will send you a MyChart message if you have MyChart. Otherwise, I will give you a call for abnormal results or send a letter if everything returned back normal. If you don't hear from me in 2 weeks, please call the office.    Thank you for coming to your visit as scheduled. We have had a large "no-show" problem lately, and this significantly limits our ability to see and care for patients. As a friendly reminder- if you cannot make your appointment please call to cancel. We do have a no show policy for those who do not cancel within 24 hours. Our policy is that if you miss or fail to cancel an appointment within 24 hours, 3 times in a 83-month period, you may be dismissed from our clinic.   Thank you for choosing [facility].   Please call [phone] with any questions about today's appointment.  Please be sure to schedule follow up at the front  desk before you leave today.   Sabino Dick, DO PGY-3 Family Medicine

## 2022-02-10 NOTE — Progress Notes (Signed)
    SUBJECTIVE:   CHIEF COMPLAINT / HPI:   Jordan Rodriguez is a 71 y.o. male who presents to the [facility] clinic today to discuss the following concerns:   Diabetes, Type 2 - Last A1c 7.9 in July  - Medications: Metformin 1500 mg (adjusted per renal function), Lantus 10 units, Jardiance 25 mg daily (did not take in the last 2 days)  - Compliance: Good mostly  - Checking BG at home: 3 times a day. His 30 day average sugar was 128. Has had intermittent  - Diet: Still has a sweet tooth  - Exercise: "a little walking, not a whole lot" - Eye exam: UTD, performed 11/09/21 and normal without signs of diabetic retinopathy - Foot exam: UTD - Microalbumin: UTD - Statin: Atorvastatin 40 mg   Hypertension Takes 50 mg of Losartan daily. Took today. He does not check home BP. No CP, SOB, lower extremity edema, HA.   PERTINENT  PMH / PSH: CKD stage 3b  OBJECTIVE:   BP 136/60   Pulse 66   Wt 131 lb (59.4 kg)   SpO2 100%   BMI 21.14 kg/m   Vitals:   02/10/22 1355 02/10/22 1419  BP: (!) 159/70 136/60  Pulse: 66   SpO2: 100%    General: NAD, pleasant, able to participate in exam Respiratory: normal effort Foot exam: No deformities, ulcerations, or other skin breakdown on feet bilaterally. +Dry skin present. Does have a callous present to medial right great toe. Sensation intact to monofilament and light touch.  PT and DP pulses intact BL.   Psych: Normal affect and mood  ASSESSMENT/PLAN:   CKD (chronic kidney disease) stage 3, GFR 30-59 ml/min Follows with [facility]. Has upcoming appointment -Check BMP -Continue sodium bicarb  -F/u as scheduled -Continue SGLT-2 (discussed to hold on sick days)  Type 2 diabetes mellitus with hyperglycemia (HCC) A1c is stable today at 7.9.  -Continue Lantus 10U, jardiance 25 mg, Metformin 1500 mg daily  -Continue checking CBG -Eye exam, foot exam, urine microalbumin UTD -Continue ARB, statin -F/u in 3 months  Hyperlipidemia Recheck lipid  panel today. LDL goal <70.  -Continue Atorvastatin 40 mg daily  Hypertension associated with diabetes (HCC) Improved on recheck. Goal <130/80. -BMP today -Continue Losartan 50 mg daily, no adjustments today    Sabino Dick, DO [facility]

## 2022-02-10 NOTE — Assessment & Plan Note (Addendum)
A1c is stable today at 7.9.  -Continue Lantus 10U, jardiance 25 mg, Metformin 1500 mg daily  -Continue checking CBG -Eye exam, foot exam, urine microalbumin UTD -Continue ARB, statin -F/u in 3 months

## 2022-02-10 NOTE — Assessment & Plan Note (Signed)
Follows with [employer]. Has upcoming appointment -Check BMP -Continue sodium bicarb  -F/u as scheduled -Continue SGLT-2 (discussed to hold on sick days)

## 2022-02-10 NOTE — Assessment & Plan Note (Signed)
Improved on recheck. Goal <130/80. -BMP today -Continue Losartan 50 mg daily, no adjustments today

## 2022-02-11 ENCOUNTER — Encounter: Payer: Self-pay | Admitting: Family Medicine

## 2022-02-11 LAB — LIPID PANEL
Chol/HDL Ratio: 2.8 ratio (ref 0.0–5.0)
Cholesterol, Total: 76 mg/dL — ABNORMAL LOW (ref 100–199)
HDL: 27 mg/dL — ABNORMAL LOW (ref >39)
LDL Chol Calc (NIH): 32 mg/dL (ref 0–99)
Triglycerides: 82 mg/dL (ref 0–149)
VLDL Cholesterol Cal: 17 mg/dL (ref 5–40)

## 2022-02-11 LAB — BASIC METABOLIC PANEL
BUN/Creatinine Ratio: 16 (ref 10–24)
BUN: 31 mg/dL — ABNORMAL HIGH (ref 8–27)
CO2: 20 mmol/L (ref 20–29)
Calcium: 7.9 mg/dL — ABNORMAL LOW (ref 8.6–10.2)
Chloride: 104 mmol/L (ref 96–106)
Creatinine, Ser: 2 mg/dL — ABNORMAL HIGH (ref 0.76–1.27)
Glucose: 153 mg/dL — ABNORMAL HIGH (ref 70–99)
Potassium: 5.1 mmol/L (ref 3.5–5.2)
Sodium: 138 mmol/L (ref 134–144)
eGFR: 35 mL/min/{1.73_m2} — ABNORMAL LOW (ref >59)

## 2022-02-17 DIAGNOSIS — E1165 Type 2 diabetes mellitus with hyperglycemia: Secondary | ICD-10-CM | POA: Diagnosis not present

## 2022-03-11 ENCOUNTER — Other Ambulatory Visit: Payer: Self-pay | Admitting: Family Medicine

## 2022-04-08 ENCOUNTER — Encounter (HOSPITAL_COMMUNITY): Payer: Self-pay | Admitting: Emergency Medicine

## 2022-04-08 ENCOUNTER — Emergency Department (HOSPITAL_COMMUNITY)
Admission: EM | Admit: 2022-04-08 | Discharge: 2022-04-08 | Disposition: A | Discharge status: Home / Self Care | Payer: 59 | Attending: Emergency Medicine | Admitting: Emergency Medicine

## 2022-04-08 ENCOUNTER — Emergency Department (HOSPITAL_COMMUNITY): Payer: 59

## 2022-04-08 ENCOUNTER — Other Ambulatory Visit: Payer: Self-pay

## 2022-04-08 DIAGNOSIS — Y92410 Unspecified street and highway as the place of occurrence of the external cause: Secondary | ICD-10-CM | POA: Insufficient documentation

## 2022-04-08 DIAGNOSIS — M25511 Pain in right shoulder: Secondary | ICD-10-CM | POA: Insufficient documentation

## 2022-04-08 DIAGNOSIS — M545 Low back pain, unspecified: Secondary | ICD-10-CM | POA: Diagnosis not present

## 2022-04-08 DIAGNOSIS — M25512 Pain in left shoulder: Secondary | ICD-10-CM | POA: Diagnosis not present

## 2022-04-08 DIAGNOSIS — M47812 Spondylosis without myelopathy or radiculopathy, cervical region: Secondary | ICD-10-CM | POA: Diagnosis not present

## 2022-04-08 DIAGNOSIS — M542 Cervicalgia: Secondary | ICD-10-CM | POA: Diagnosis not present

## 2022-04-08 DIAGNOSIS — M25551 Pain in right hip: Secondary | ICD-10-CM | POA: Diagnosis not present
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
## 2022-04-08 NOTE — ED Notes (Signed)
AVS reviewed with pt prior to discharge. Pt verbalizes understanding. Belongings with pt upon depart. Ambulatory to POV by self.
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
## 2022-04-08 NOTE — Discharge Instructions (Signed)
Take 4 over the counter ibuprofen tablets 3 times a day or 2 over-the-counter naproxen tablets twice a day for pain. Also take tylenol 1029m(2 extra strength) four times a day.    Please follow-up with your family doctor.  If you are still having significant pain they may choose to reimage or send you to a specialist.
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
## 2022-04-11 NOTE — Patient Instructions (Signed)
Health Maintenance, Male Adopting a healthy lifestyle and getting preventive care are important in promoting health and wellness. Ask your health care provider about: The right schedule for you to have regular tests and exams. Things you can do on your own to prevent diseases and keep yourself healthy. What should I know about diet, weight, and exercise? Eat a healthy diet  Eat a diet that includes plenty of vegetables, fruits, low-fat dairy products, and lean protein. Do not eat a lot of foods that are high in solid fats, added sugars, or sodium. Maintain a healthy weight Body mass index (BMI) is a measurement that can be used to identify possible weight problems. It estimates body fat based on height and weight. Your health care provider can help determine your BMI and help you achieve or maintain a healthy weight. Get regular exercise Get regular exercise. This is one of the most important things you can do for your health. Most adults should: Exercise for at least 150 minutes each week. The exercise should increase your heart rate and make you sweat (moderate-intensity exercise). Do strengthening exercises at least twice a week. This is in addition to the moderate-intensity exercise. Spend less time sitting. Even light physical activity can be beneficial. Watch cholesterol and blood lipids Have your blood tested for lipids and cholesterol at 71 years of age, then have this test every 5 years. You may need to have your cholesterol levels checked more often if: Your lipid or cholesterol levels are high. You are older than 71 years of age. You are at high risk for heart disease. What should I know about cancer screening? Many types of cancers can be detected early and may often be prevented. Depending on your health history and family history, you may need to have cancer screening at various ages. This may include screening for: Colorectal cancer. Prostate cancer. Skin cancer. Lung  cancer. What should I know about heart disease, diabetes, and high blood pressure? Blood pressure and heart disease High blood pressure causes heart disease and increases the risk of stroke. This is more likely to develop in people who have high blood pressure readings or are overweight. Talk with your health care provider about your target blood pressure readings. Have your blood pressure checked: Every 3-5 years if you are 18-39 years of age. Every year if you are 40 years old or older. If you are between the ages of 65 and 75 and are a current or former smoker, ask your health care provider if you should have a one-time screening for abdominal aortic aneurysm (AAA). Diabetes Have regular diabetes screenings. This checks your fasting blood sugar level. Have the screening done: Once every three years after age 45 if you are at a normal weight and have a low risk for diabetes. More often and at a younger age if you are overweight or have a high risk for diabetes. What should I know about preventing infection? Hepatitis B If you have a higher risk for hepatitis B, you should be screened for this virus. Talk with your health care provider to find out if you are at risk for hepatitis B infection. Hepatitis C Blood testing is recommended for: Everyone born from 1945 through 1965. Anyone with known risk factors for hepatitis C. Sexually transmitted infections (STIs) You should be screened each year for STIs, including gonorrhea and chlamydia, if: You are sexually active and are younger than 71 years of age. You are older than 71 years of age and your   health care provider tells you that you are at risk for this type of infection. Your sexual activity has changed since you were last screened, and you are at increased risk for chlamydia or gonorrhea. Ask your health care provider if you are at risk. Ask your health care provider about whether you are at high risk for HIV. Your health care provider  may recommend a prescription medicine to help prevent HIV infection. If you choose to take medicine to prevent HIV, you should first get tested for HIV. You should then be tested every 3 months for as long as you are taking the medicine. Follow these instructions at home: Alcohol use Do not drink alcohol if your health care provider tells you not to drink. If you drink alcohol: Limit how much you have to 0-2 drinks a day. Know how much alcohol is in your drink. In the [place] one drink equals one 12 oz bottle of beer (355 mL), one 5 oz glass of wine (148 mL), or one 1 oz glass of hard liquor (44 mL). Lifestyle Do not use any products that contain nicotine or tobacco. These products include cigarettes, chewing tobacco, and vaping devices, such as e-cigarettes. If you need help quitting, ask your health care provider. Do not use street drugs. Do not share needles. Ask your health care provider for help if you need support or information about quitting drugs. General instructions Schedule regular health, dental, and eye exams. Stay current with your vaccines. Tell your health care provider if: You often feel depressed. You have ever been abused or do not feel safe at home. Summary Adopting a healthy lifestyle and getting preventive care are important in promoting health and wellness. Follow your health care provider's instructions about healthy diet, exercising, and getting tested or screened for diseases. Follow your health care provider's instructions on monitoring your cholesterol and blood pressure. This information is not intended to replace advice given to you by your health care provider. Make sure you discuss any questions you have with your health care provider. Document Revised: 07/06/2020 Document Reviewed: 07/06/2020 Elsevier Patient Education  2023 [employer]  

## 2022-04-11 NOTE — Progress Notes (Unsigned)
I connected with  Jordan Rodriguez on 04/12/2022 by a audio enabled telemedicine application and verified that I am speaking with the correct person using two identifiers.  Patient Location: Home  Provider Location: Home Office  I discussed the limitations of evaluation and management by telemedicine. The patient expressed understanding and agreed to proceed.  Subjective:   Jordan Rodriguez is a 72 y.o. male who presents for Medicare Annual/Subsequent preventive examination.  Review of Systems    Per HPI unless specifically indicated below.  Cardiac Risk Factors include: advanced age (>40mn, >>40women);male gender, Hypertension, and Hyperlipidemia.         Objective:       04/08/2022    3:09 PM 04/08/2022    1:24 PM 02/10/2022    2:19 PM  Vitals with BMI  Systolic [id number] Diastolic 72 84 60  Pulse 68 66     Today's Vitals   04/12/22 1045  PainSc: 5    There is no height or weight on file to calculate BMI.     04/12/2022   10:51 AM 04/08/2022    1:23 PM 02/10/2022    1:56 PM 09/21/2021   10:48 AM 05/31/2021    4:31 PM 11/04/2020   10:20 AM 06/08/2020   11:14 AM  Advanced Directives  Does Patient Have a Medical Advance Directive? No No No No No No No  Would patient like information on creating a medical advance directive? No - Patient declined  No - Patient declined No - Patient declined No - Patient declined Yes (MAU/Ambulatory/Procedural Areas - Information given) No - Patient declined    Current Medications (verified) Outpatient Encounter Medications as of 04/12/2022  Medication Sig   Accu-Chek Softclix Lancets lancets SMARTSIG:1 Topical 4 Times Daily   Accu-Chek Softclix Lancets lancets Use as instructed   aspirin EC 81 MG tablet Take 1 tablet (81 mg total) by mouth daily.   atorvastatin (LIPITOR) 40 MG tablet Take 1 tablet by mouth once daily   BD PEN NEEDLE NANO 2ND GEN 32G X 4 MM MISC USE AS DIRECTED THREE TIMES DAILY TO  CHECK  BLOOD  GLUCOSE   blood  glucose meter kit and supplies KIT Dispense based on patient and insurance preference. Use up to four times daily as directed. (FOR ICD-9 250.00, 250.01).   empagliflozin (JARDIANCE) 25 MG TABS tablet Take 1 tablet (25 mg total) by mouth daily.   glucose blood (ACCU-CHEK GUIDE) test strip USE TO CHECK BLOOD SUGAR THREE TIMES DAILY   Lancet Devices (ACCU-CHEK SOFTCLIX) lancets Check blood glucose up to 3 times daily.   LANTUS SOLOSTAR 100 UNIT/ML Solostar Pen INJECT 12 UNITS SUBCUTANEOUSLY IN THE MORNING   losartan (COZAAR) 50 MG tablet Take 50 mg by mouth at bedtime.   metFORMIN (GLUCOPHAGE) 500 MG tablet Take 3 tablets (1,500 mg total) by mouth daily with breakfast.   sildenafil (VIAGRA) 50 MG tablet Take 1 tablet (50 mg total) by mouth daily as needed for erectile dysfunction.   sodium bicarbonate 650 MG tablet Take 650 mg by mouth 2 (two) times daily.   No facility-administered encounter medications on file as of 04/12/2022.    Allergies (verified) Patient has no known allergies.   History: Past Medical History:  Diagnosis Date   Diabetes mellitus without complication ([facility]    Substance abuse ([place]    hx alcohol (quit 1982) and tobacco (quit 1995)   Past Surgical History:  Procedure Laterality Date   COLONOSCOPY  2015  NASAL FRACTURE SURGERY     Family History  Problem Relation Age of Onset   Diabetes Mother    Hypertension Mother    Stroke Mother    Early death Father    Alcohol abuse Father    Diabetes Father    Drug abuse Sister    Cancer Paternal Grandfather    Diabetes Sister    Kidney disease Sister    Kidney disease Sister    Thyroid disease Sister    Hypertension Sister    Colon cancer Neg Hx    Social History   Socioeconomic History   Marital status: Single    Spouse name: Not on file   Number of children: 3   Years of education: 12   Highest education level: High school graduate  Occupational History    Comment: Scientist, research (physical sciences)    Occupation:  retired  Tobacco Use   Smoking status: Former    Types: Cigarettes    Quit date: 02/28/1993    Years since quitting: 29.1    Passive exposure: Past   Smokeless tobacco: Never  Vaping Use   Vaping Use: Never used  Substance and Sexual Activity   Alcohol use: No    Comment: quit 1982   Drug use: Yes    Types: Marijuana    Comment: quit 1995   Sexual activity: Not Currently  Other Topics Concern   Not on file  Social History Narrative   Patient lives alone. He does drive himself. Patient is retired.   He does have close relationships with his sister, who he drives around to run errands and medical apts. Patient is close with his children, he sees them often. However, one of his sons is in prison.    Patient enjoys playing cards and bingo.    Social Determinants of Health   Financial Resource Strain: Low Risk  (04/12/2022)   Overall Financial Resource Strain (CARDIA)    Difficulty of Paying Living Expenses: Not hard at all  Food Insecurity: No Food Insecurity (04/12/2022)   Hunger Vital Sign    Worried About Running Out of Food in the Last Year: Never true    Ran Out of Food in the Last Year: Never true  Transportation Needs: No Transportation Needs (04/12/2022)   PRAPARE - Hydrologist (Medical): No    Lack of Transportation (Non-Medical): No  Physical Activity: Inactive (04/12/2022)   Exercise Vital Sign    Days of Exercise per Week: 0 days    Minutes of Exercise per Session: 0 min  Stress: No Stress Concern Present (04/12/2022)   [place]    Feeling of Stress : Not at all  Social Connections: Moderately Isolated (04/12/2022)   Social Connection and Isolation Panel [NHANES]    Frequency of Communication with Friends and Family: More than three times a week    Frequency of Social Gatherings with Friends and Family: More than three times a week    Attends Religious Services: 1 to 4  times per year    Active Member of [employer] or Organizations: No    Attends Music therapist: Never    Marital Status: Never married    Tobacco Counseling Counseling given: No   Clinical Intake:  Pre-visit preparation completed: No  Pain : 0-10 Pain Score: 5  Pain Type: Acute pain Pain Location: Back Pain Orientation: Lower Pain Descriptors / Indicators: Aching Pain Onset: 1 to 4 weeks ago  Nutritional Status: BMI of 19-24  Normal Nutritional Risks: Unintentional weight loss, Other (Comment) (sore in the mouh from his dentures x 1 week) Diabetes: Yes CBG done?: Yes CBG resulted in Enter/ Edit results?: Yes Did pt. bring in CBG monitor from home?: No  How often do you need to have someone help you when you read instructions, pamphlets, or other written materials from your doctor or pharmacy?: 1 - Never  Diabetic?Nutrition Risk Assessment:  Has the patient had any N/V/D within the last 2 months?  No  Does the patient have any non-healing wounds?  No  Has the patient had any unintentional weight loss or weight gain?  Yes   Diabetes:  Is the patient diabetic?  Yes  If diabetic, was a CBG obtained today?  Yes  Did the patient bring in their glucometer from home?  No  How often do you monitor your CBG's? Daily .   Financial Strains and Diabetes Management:  Are you having any financial strains with the device, your supplies or your medication? No .  Does the patient want to be seen by Chronic Care Management for management of their diabetes?  No  Would the patient like to be referred to a Nutritionist or for Diabetic Management?  No   Diabetic Exams:  Diabetic Eye Exam: Completed 11/17/2021 Diabetic Foot Exam: Completed 09/22/2022    Interpreter Needed?: No  Information entered by :: Donnie Mesa, CMA   Activities of Daily Living    04/12/2022   10:43 AM  In your present state of health, do you have any difficulty performing the following  activities:  Hearing? 0  Vision? [place], [facility]  Difficulty concentrating or making decisions? 0  Walking or climbing stairs? 0  Dressing or bathing? 0  Doing errands, shopping? 0    Patient Care Team: Sharion Settler, DO as PCP - General (Family Medicine) Lazaro Arms, RN as Case Manager  Indicate any recent Medical Services you may have received from other than [facility] providers in the past year (date may be approximate).     Assessment:   This is a routine wellness examination for [place].  Hearing/Vision screen Denies any hearing issues. Denies any vision changes. Annual [place], [facility]   Dietary issues and exercise activities discussed: Current Exercise Habits: The patient does not participate in regular exercise at present, Exercise limited by: None identified   Goals Addressed             This Visit's Progress    Stay Active and Independent           Why is this important?   Regular activity or exercise is important to managing back pain.  Activity helps to keep your muscles strong.  You will sleep better and feel more relaxed.  You will have more energy and feel less stressed.  If you are not active now, start slowly. Little changes make a big difference.  Rest, but not too much.  Stay as active as you can and listen to your body's signals.            Depression Screen    04/12/2022   10:42 AM 02/10/2022    1:56 PM 09/21/2021   10:48 AM 05/31/2021    4:31 PM 11/04/2020   10:18 AM 06/08/2020   11:14 AM 05/25/2020   10:57 AM  PHQ 2/9 Scores  PHQ - 2 Score 0 0 0 0 0 0  0  PHQ- 9 Score  0 0 0  0 0    Fall Risk    04/12/2022   10:43 AM 02/10/2022    1:55 PM 09/21/2021   10:49 AM 05/31/2021    4:31 PM 11/04/2020   10:20 AM  Fall Risk   Falls in the past year? 0 0 0 0 0  Number falls in past yr: 0 0 0 0 0  Injury with Fall? 0 0 0 0 1  Risk for fall due to : No Fall Risks  No Fall Risks  No Fall  Risks  Follow up Falls evaluation completed  Falls prevention discussed  Falls prevention discussed    FALL RISK PREVENTION PERTAINING TO THE HOME:  Any stairs in or around the home? No  If so, are there any without handrails? Yes  Home free of loose throw rugs in walkways, pet beds, electrical cords, etc? Yes  Adequate lighting in your home to reduce risk of falls? Yes   ASSISTIVE DEVICES UTILIZED TO PREVENT FALLS:  Life alert? No  Use of a cane, walker or w/c? No  Grab bars in the bathroom? Yes  Shower chair or bench in shower? No  Elevated toilet seat or a handicapped toilet? Yes   TIMED UP AND GO:  Was the test performed? Unable to perform, virtual appointment     Cognitive Function:        04/12/2022   10:44 AM 11/04/2020   10:23 AM 09/03/2018   11:29 AM  6CIT Screen  What Year? 0 points 0 points 0 points  What month? 0 points 0 points 0 points  What time? 0 points 0 points 0 points  Count back from 20 0 points 0 points 0 points  Months in reverse 0 points 2 points 0 points  Repeat phrase 2 points 0 points 0 points  Total Score 2 points 2 points 0 points    Immunizations Immunization History  Administered Date(s) Administered   Fluad Quad(high Dose 65+) 12/11/2019, 02/08/2021, 02/10/2022   Influenza,inj,Quad PF,6+ Mos 02/17/2015, 11/27/2017, 11/13/2018   Janssen (J&J) SARS-COV-2 Vaccination 09/12/2019   Moderna Sars-Covid-2 Vaccination 03/28/2020   Pneumococcal Conjugate-13 11/13/2018   Tdap 10/21/2019    TDAP status: Up to date  Flu Vaccine status: Up to date  Pneumococcal vaccine status: Due, Education has been provided regarding the importance of this vaccine. Advised may receive this vaccine at local pharmacy or Health Dept. Aware to provide a copy of the vaccination record if obtained from local pharmacy or Health Dept. Verbalized acceptance and understanding.  Covid-19 vaccine status: Information provided on how to obtain vaccines.   Qualifies for  Shingles Vaccine? Yes   Zostavax completed No   Shingrix Completed?: No.    Education has been provided regarding the importance of this vaccine. Patient has been advised to call insurance company to determine out of pocket expense if they have not yet received this vaccine. Advised may also receive vaccine at local pharmacy or Health Dept. Verbalized acceptance and understanding.  Screening Tests Health Maintenance  Topic Date Due   Zoster Vaccines- Shingrix (1 of 2) Never done   Pneumonia Vaccine 62+ Years old (2 of 2 - PPSV23 or PCV20) 11/13/2019   COVID-19 Vaccine (3 - 2023-24 season) 10/29/2021   Diabetic kidney evaluation - Urine ACR  06/01/2022   HEMOGLOBIN A1C  08/12/2022   FOOT EXAM  09/22/2022   OPHTHALMOLOGY EXAM  11/18/2022   Diabetic kidney evaluation - eGFR measurement  02/11/2023  Medicare Annual Wellness (AWV)  04/13/2023   COLONOSCOPY (Pts 45-80yr Insurance coverage will need to be confirmed)  10/15/2023   DTaP/Tdap/Td (2 - Td or Tdap) 10/20/2029   INFLUENZA VACCINE  Completed   Hepatitis C Screening  Completed   HPV VACCINES  Aged Out    Health Maintenance  Health Maintenance Due  Topic Date Due   Zoster Vaccines- Shingrix (1 of 2) Never done   Pneumonia Vaccine 71 Years old (2 of 2 - PPSV23 or PCV20) 11/13/2019   COVID-19 Vaccine (3 - 2023-24 season) 10/29/2021    Colorectal cancer screening: Type of screening: Colonoscopy. Completed 10/14/2013. Repeat every 10 years  Lung Cancer Screening: (Low Dose CT Chest recommended if Age 71-80years, 30 pack-year currently smoking OR have quit w/in 15years.) does not qualify.   Lung Cancer Screening Referral: no applicable   Additional Screening:  Hepatitis C Screening: does qualify; Completed 02/17/2015  Vision Screening: Recommended annual ophthalmology exams for early detection of glaucoma and other disorders of the eye. Is the patient up to date with their annual eye exam?  Yes  Who is the provider or what  is the name of the [facility] in which the patient [facility] If pt is not established with a provider, would they like to be referred to a provider to establish care? No .   Dental Screening: Recommended annual dental exams for proper oral hygiene  Community Resource Referral / Chronic Care Management: CRR required this visit?  No   CCM required this visit?  No      Plan:     I have personally reviewed and noted the following in the patient's chart:   Medical and social history Use of alcohol, tobacco or illicit drugs  Current medications and supplements including opioid prescriptions. Patient is not currently taking opioid prescriptions. Functional ability and status Nutritional status Physical activity Advanced directives List of other physicians Hospitalizations, surgeries, and ER visits in previous 12 months Vitals Screenings to include cognitive, depression, and falls Referrals and appointments  In addition, I have reviewed and discussed with patient certain preventive protocols, quality metrics, and best practice recommendations. A written personalized care plan for preventive services as well as general preventive health recommendations were provided to patient.     Mr. BDanowski, Thank you for taking time to come for your Medicare Wellness Visit. I appreciate your ongoing commitment to your health goals. Please review the following plan we discussed and let me know if I can assist you in the future.   These are the goals we discussed:  Goals       Acknowledge receipt of Advanced Directive package     Patient reports he does have the packet, however he did not understand how to fill it out.  Discussed with patient.      complete Advance Directives     CARE PLAN ENTRY (see longitudinal plan of care for additional care plan information)  Current Barriers:  Patient does not have an Advance Directive Acknowledges deficits, education  and support in order to complete this document Clinical Social Work Goal(s):  Over the next 45 days, patient will review mailed EMMI education on Advance Directive as evidenced by patient self report of review Over the next 45 days, the patient will  completion of Advance Directive, notarize and provide a copy to provider office Interventions provided by LCSW: A voluntary discussion about advanced care planning including importance of advanced directives, healthcare proxy  and living will was discussed with the patient.  E-Mailed the patient an EMMI educational information on Advance Directives as well as reviewed Advance Directive packet he received from the [facility] office Patient Self Care Activities:  Is able to complete documentation with assistance of his daughter Able to identify next of kin or [place] of Attorney/Health Care Agent Patient will review EMMI education and call LCSW with questions Initial goal documentation     Exercise 3-4x per week (30 min per time)     HEMOGLOBIN A1C < 7.0     Stay Active and Independent         Why is this important?   Regular activity or exercise is important to managing back pain.  Activity helps to keep your muscles strong.  You will sleep better and feel more relaxed.  You will have more energy and feel less stressed.  If you are not active now, start slowly. Little changes make a big difference.  Rest, but not too much.  Stay as active as you can and listen to your body's signals.             This is a list of the screening recommended for you and due dates:  Health Maintenance  Topic Date Due   Zoster (Shingles) Vaccine (1 of 2) Never done   Pneumonia Vaccine (2 of 2 - PPSV23 or PCV20) 11/13/2019   COVID-19 Vaccine (3 - 2023-24 season) 10/29/2021   Yearly kidney health urinalysis for diabetes  06/01/2022   Hemoglobin A1C  08/12/2022   Complete foot exam   09/22/2022   Eye exam for diabetics  11/18/2022   Yearly kidney  function blood test for diabetes  02/11/2023   Medicare Annual Wellness Visit  04/13/2023   Colon Cancer Screening  10/15/2023   DTaP/Tdap/Td vaccine (2 - Td or Tdap) 10/20/2029   Flu Shot  Completed   Hepatitis C Screening: USPSTF Recommendation to screen - Ages 18-79 yo.  Completed   HPV Vaccine  Aged [address], [place]   04/12/2022  Nurse Notes: Approximately 30 minute Non-Face -To-Face Medicare Wellness Visit

## 2022-04-12 ENCOUNTER — Ambulatory Visit (INDEPENDENT_AMBULATORY_CARE_PROVIDER_SITE_OTHER): Payer: 59

## 2022-04-12 DIAGNOSIS — Z0000 Encounter for general adult medical examination without abnormal findings: Secondary | ICD-10-CM | POA: Diagnosis not present

## 2022-05-12 DIAGNOSIS — E1122 Type 2 diabetes mellitus with diabetic chronic kidney disease: Secondary | ICD-10-CM | POA: Diagnosis not present

## 2022-05-12 DIAGNOSIS — E785 Hyperlipidemia, unspecified: Secondary | ICD-10-CM | POA: Diagnosis not present

## 2022-05-12 DIAGNOSIS — E872 Acidosis, unspecified: Secondary | ICD-10-CM | POA: Diagnosis not present

## 2022-05-12 DIAGNOSIS — D631 Anemia in chronic kidney disease: Secondary | ICD-10-CM | POA: Diagnosis not present

## 2022-05-12 DIAGNOSIS — R809 Proteinuria, unspecified: Secondary | ICD-10-CM | POA: Diagnosis not present

## 2022-05-12 DIAGNOSIS — I129 Hypertensive chronic kidney disease with stage 1 through stage 4 chronic kidney disease, or unspecified chronic kidney disease: Secondary | ICD-10-CM | POA: Diagnosis not present

## 2022-05-12 DIAGNOSIS — N2581 Secondary hyperparathyroidism of renal origin: Secondary | ICD-10-CM | POA: Diagnosis not present

## 2022-05-12 DIAGNOSIS — N1832 Chronic kidney disease, stage 3b: Secondary | ICD-10-CM | POA: Diagnosis not present

## 2022-05-16 ENCOUNTER — Ambulatory Visit (INDEPENDENT_AMBULATORY_CARE_PROVIDER_SITE_OTHER): Payer: 59 | Admitting: Family Medicine

## 2022-05-16 ENCOUNTER — Encounter: Payer: Self-pay | Admitting: Family Medicine

## 2022-05-16 VITALS — BP 138/78 | HR 68 | Ht 65.0 in | Wt 127.0 lb

## 2022-05-16 DIAGNOSIS — Z794 Long term (current) use of insulin: Secondary | ICD-10-CM

## 2022-05-16 DIAGNOSIS — I152 Hypertension secondary to endocrine disorders: Secondary | ICD-10-CM | POA: Diagnosis not present

## 2022-05-16 DIAGNOSIS — E1165 Type 2 diabetes mellitus with hyperglycemia: Secondary | ICD-10-CM | POA: Diagnosis not present

## 2022-05-16 DIAGNOSIS — E1159 Type 2 diabetes mellitus with other circulatory complications: Secondary | ICD-10-CM

## 2022-05-16 DIAGNOSIS — N1832 Chronic kidney disease, stage 3b: Secondary | ICD-10-CM

## 2022-05-16 LAB — POCT GLYCOSYLATED HEMOGLOBIN (HGB A1C): HbA1c, POC (controlled diabetic range): 8 % — AB (ref 0.0–7.0)

## 2022-05-16 NOTE — Patient Instructions (Signed)
It was wonderful to see you today.  Please bring ALL of your medications with you to every visit.   Today we talked about:  Your A1c was 8. Ideally, we would like your number to be less than 8!  We will need to recheck in 3 months.  I am not adjusting your medications at this time because I don't want you to have more low sugars.  I would encourage you to continue making improvements in your diet and exercising more! See below for diet recommendations. It is recommended that you get 150 minutes of exercise each week. That is 30 minutes 5 times a week.   Diet Recommendations for Diabetes  Carbohydrate includes starch, sugar, and fiber.  Of these, only sugar and starch raise blood glucose.  (Fiber is found in fruits, vegetables [especially skin, seeds, and stalks], whole grains, and beans.)   Starchy (carb) foods: Bread, rice, pasta, potatoes, corn, cereal, grits, crackers, bagels, muffins, all baked goods.  (Fruit, milk, and yogurt also have carbohydrate, but most of these foods will not spike your blood sugar as most starchy or sweet foods will.)  A few fruits do cause high blood sugars; use small portions of bananas (limit to 1/2 at a time), grapes, watermelon, and oranges.   Protein foods: Meat, fish, poultry, eggs, dairy foods, and beans such as pinto and kidney beans (beans also provide carbohydrate).   1. Eat at least 3 REAL meals and 1-2 snacks per day.  Eat breakfast within one hour of getting up.  Have something to eat at least every 5 hours while awake.  - A REAL meal for lunch or dinner includes at least some protein, some starch, and vegetables and/or fruit.   - A REAL breakfast needs to include both starch and protein foods.     2. Limit starchy foods to TWO portions per meal and ONE per snack. ONE portion of a starchy food is equal to the following:  - ONE slice of bread (or its equivalent, such as half of a hamburger bun).  - 1/2 cup of a "scoopable" starchy food such as potatoes  or rice.  - 15 grams of Total Carbohydrate as shown on food label.  - 4 ounces of a sweet drink (including fruit juice).  3. Include twice the volume of vegetables as protein or carbohydrate foods in at least 3 meals per week.  - Fresh or frozen vegetables are best.  - Keep frozen vegetables on hand for a quick option.         Thank you for coming to your visit as scheduled. We have had a large "no-show" problem lately, and this significantly limits our ability to see and care for patients. As a friendly reminder- if you cannot make your appointment please call to cancel. We do have a no show policy for those who do not cancel within 24 hours. Our policy is that if you miss or fail to cancel an appointment within 24 hours, 3 times in a 12-month period, you may be dismissed from our clinic.   Thank you for choosing [facility].   Please call [phone] with any questions about today's appointment.  Please be sure to schedule follow up at the front  desk before you leave today.   Sharion Settler, DO PGY-3 Family Medicine

## 2022-05-16 NOTE — Progress Notes (Signed)
    SUBJECTIVE:   CHIEF COMPLAINT / HPI:   Jordan Rodriguez is a 72 y.o. male who presents to the [facility] clinic today to discuss the following concerns:   Diabetes, Type 2 - Last A1c 7.9 in December - Medications: Metformin 1500 mg daily, Jardiance 25 mg daily, Lantus 10 units daily - Compliance: Good  - Checking BG at home: Checks TID. Reports 3-4 lows last month to about 60s. States no readings in 300s, mostly 100-200s.  - Diet: Working on cutting back on grease and sweets  - Exercise: Walks mostly  - Eye exam: UTD, next due 06/2021 - Foot exam: UTD - Microalbumin: Due next month, recently checked at Nephrology office so will not repeat today - Statin: Atorvastatin 40 mg - Denies symptoms of hypoglycemia, polyuria, polydipsia, numbness extremities, foot ulcers/trauma  CKD Stage 3b Last BMP 01/2022 creatinine 2, eGFR 35. Follows with Nephrology, had a recent visit on 3/14 and states that they discussed possibly starting a new medication for renal protection- patient is unsure which. He had blood work done at last visit, reports still waiting on the results. On sodium bicarb 650 mg twice daily.  Hypertension Taking losartan 50 mg daily. BMP UTD.   PERTINENT  PMH / PSH: Hypertension, hyperlipidemia  OBJECTIVE:   BP 138/78   Pulse 68   Ht 5\' 5"  (1.651 m)   Wt 127 lb (57.6 kg)   SpO2 100%   BMI 21.13 kg/m    General: NAD, pleasant, able to participate in exam Respiratory: normal effort Extremities: no edema or cyanosis. Skin: warm and dry Psych: Normal affect and mood  ASSESSMENT/PLAN:   1. Type 2 diabetes mellitus with hyperglycemia, with long-term current use of insulin (HCC) Hemoglobin A1c 8. Goal for him is <8 given age and recurrent hypoglycemic episodes with tighter control. - Repeat HgB A1c in 3 months - Continue current medications - Will work towards lifestyle modifications: improved diet, exercise - Continue statin, ARB  2. Stage 3b chronic kidney disease  ([place]) Followed by [facility]. Had recent visit on 3/14. -Will await fax with lab results   3. Hypertension associated with diabetes ([place]) Goal ideally <130/80 given CKD and T2DM but also want to be conservative given risk for falls. Will not adjust medications at this time. -Continue ARB, SGLT-2    Sharion Settler, [place]

## 2022-05-30 ENCOUNTER — Other Ambulatory Visit: Payer: Self-pay | Admitting: Family Medicine

## 2022-05-31 ENCOUNTER — Other Ambulatory Visit: Payer: Self-pay | Admitting: Family Medicine

## 2022-07-05 ENCOUNTER — Other Ambulatory Visit: Payer: Self-pay | Admitting: Family Medicine

## 2022-07-05 DIAGNOSIS — Z794 Long term (current) use of insulin: Secondary | ICD-10-CM

## 2022-07-09 ENCOUNTER — Other Ambulatory Visit: Payer: Self-pay | Admitting: Family Medicine

## 2022-07-18 ENCOUNTER — Other Ambulatory Visit: Payer: Self-pay | Admitting: Family Medicine

## 2022-08-09 NOTE — Progress Notes (Signed)
    SUBJECTIVE:   CHIEF COMPLAINT / HPI:   Jordan Rodriguez is a 72 y.o. male who presents to the [facility] clinic today to discuss the following concerns:   Diabetes, Type 2 - Last A1c 8 in March  - Medications: Metformin 1500 mg daily, Jardiance 25 mg daily, Lantus 10 units daily  - Compliance: Good - Checking BG at home: Yes. Brings meter with him today and glucose ranges 90s-130s mostly. Does have 2-3 readings that are >200 but none above 300   - Diet: Still eating sweets.  - Exercise: Walks  - Eye exam: UTD  - Foot exam: UTD - Microalbumin: UTD, had a [place] kidney in March. Elevated Alb/Creat ratio of 261. On ARB and SGLT-2  - Statin: Atorvastatin 40 mg daily   PERTINENT  PMH / PSH: CKD stage 3b, follows with Nephro   OBJECTIVE:   BP 139/64   Pulse (!) 58   Ht 5\' 5"  (1.651 m)   Wt 123 lb 9.6 oz (56.1 kg)   SpO2 100%   BMI 20.57 kg/m    General: NAD, pleasant, able to participate in exam Cardiac: RRR, no murmurs. Respiratory: CTAB, normal effort, No wheezes, rales or rhonchi Extremities: no edema or cyanosis. Psych: Normal affect and mood  ASSESSMENT/PLAN:   1. Controlled type 2 diabetes mellitus with complication, with long-term current use of insulin (HCC) A1c 7.9, at goal (<8 given age and to avoid hypoglycemia). UTD on BMP, urine microalbumin, foot and eye exam.  - HgB A1c - Insulin Pen Needle (BD PEN NEEDLE NANO 2ND GEN) 32G X 4 MM MISC; USE AS DIRECTED THREE TIMES DAILY TO  CHECK  BLOOD  GLUCOSE  Dispense: 900 each; Refill: 0 - Continue Lantus 10 U daily, Metformin 1500 mg daily, jardiance, statin   2. Hypertension associated with diabetes (HCC) At goal BP <140/90.  -Continue losartan, jardiance   3. Stage 3b chronic kidney disease (HCC) Mention that his nephrologist is no longer in network with his insurance.  Encouraged him to reach out to his insurance to see which other nephrologist would be in network.    Sabino Dick, DO [facility]

## 2022-08-12 ENCOUNTER — Ambulatory Visit (INDEPENDENT_AMBULATORY_CARE_PROVIDER_SITE_OTHER): Payer: 59 | Admitting: Family Medicine

## 2022-08-12 VITALS — BP 139/64 | HR 58 | Ht 65.0 in | Wt 123.6 lb

## 2022-08-12 DIAGNOSIS — N1832 Chronic kidney disease, stage 3b: Secondary | ICD-10-CM | POA: Diagnosis not present

## 2022-08-12 DIAGNOSIS — E118 Type 2 diabetes mellitus with unspecified complications: Secondary | ICD-10-CM

## 2022-08-12 DIAGNOSIS — I152 Hypertension secondary to endocrine disorders: Secondary | ICD-10-CM | POA: Diagnosis not present

## 2022-08-12 DIAGNOSIS — Z794 Long term (current) use of insulin: Secondary | ICD-10-CM

## 2022-08-12 DIAGNOSIS — E1159 Type 2 diabetes mellitus with other circulatory complications: Secondary | ICD-10-CM

## 2022-08-12 LAB — POCT GLYCOSYLATED HEMOGLOBIN (HGB A1C): HbA1c, POC (controlled diabetic range): 7.9 % — AB (ref 0.0–7.0)

## 2022-08-12 MED ORDER — BD PEN NEEDLE NANO 2ND GEN 32G X 4 MM MISC: 0 refills | Status: AC

## 2022-08-12 NOTE — Patient Instructions (Signed)
It was wonderful to see you today.  Today we talked about:  Your A1c was great! It was 7.9, our goal is <6.  You should follow up in 6 months for your next A1c.  Continue taking 10 units of Lantus, jardiance, Metformin daily. Continue your other medications as prescribed.  Call your insurance to see which Nephrologist is in network.   Continue to go on walks and move your body! This will keep you independent.  It was a pleasure caring for you. Your next doctor is going to be Dr. Ardyth Harps, she is fantastic and you will be in fabulous hands!  Thank you for coming to your visit as scheduled. We have had a large "no-show" problem lately, and this significantly limits our ability to see and care for patients. As a friendly reminder- if you cannot make your appointment please call to cancel. We do have a no show policy for those who do not cancel within 24 hours. Our policy is that if you miss or fail to cancel an appointment within 24 hours, 3 times in a 27-month period, you may be dismissed from our clinic.   Thank you for choosing [facility].   Please call [phone] with any questions about today's appointment.  Please be sure to schedule follow up at the front  desk before you leave today.   Sabino Dick, DO PGY-3 Family Medicine

## 2022-08-24 ENCOUNTER — Other Ambulatory Visit: Payer: Self-pay | Admitting: Family Medicine

## 2022-09-13 ENCOUNTER — Telehealth: Payer: Self-pay

## 2022-09-13 NOTE — Patient Outreach (Signed)
  Care Coordination   Follow Up Visit Note   09/13/2022 Name: Jordan Rodriguez MRN: [id number] DOB: 11-Jan-1951  Jordan Rodriguez is a 72 y.o. year old male who sees Elberta Fortis, MD for primary care. I spoke with  Constance Goltz by phone today.  What matters to the patients health and wellness today?  Jordan Rodriguez is doing well. He stated, " I just need to be obedient with food and taking medications." He tries to eat three meals daily but still eats cereal when his blood sugar is low. He tries to get exercise but not out in the heat, which I told him is best. His blood sugars have been running around 120 and occasionally in the 200s. He is determined to keep them in his target range and not have any lows, which reassures his commitment to his health.    Goals Addressed             This Visit's Progress    Patient will maintain blood sugars within his target range       Care Coordination Interventions: Reviewed medications with patient and discussed importance of medication adherence Counseled on importance of regular laboratory monitoring as prescribed Discussed plans with patient for ongoing care management follow up and provided patient with direct contact information for care management team Active listening / Reflection utilized  Emotional Support Provided         SDOH assessments and interventions completed:  No     Care Coordination Interventions:  Yes, provided   Follow up plan: Follow up call scheduled for 10/14/22  11 am    Encounter Outcome:  Pt. Visit Completed   Juanell Fairly RN, BSN, [facility] Care Coordinator [facility]   Phone: [phone]

## 2022-09-13 NOTE — Patient Instructions (Signed)
Visit Information  Thank you for taking time to visit with me today. Please don't hesitate to contact me if I can be of assistance to you.   Following are the goals we discussed today:   Goals Addressed             This Visit's Progress    Patient will maintain blood sugars within his target range       Care Coordination Interventions: Reviewed medications with patient and discussed importance of medication adherence Counseled on importance of regular laboratory monitoring as prescribed Discussed plans with patient for ongoing care management follow up and provided patient with direct contact information for care management team Active listening / Reflection utilized  Emotional Support Provided         Our next appointment is by telephone on 10/14/22 at 11 am  Please call the care guide team at [phone] if you need to cancel or reschedule your appointment.   If you are experiencing a Mental Health or Behavioral Health Crisis or need someone to talk to, please call 1-800-273-TALK (toll free, 24 hour hotline)   The patient verbalized understanding of instructions, educational materials, and care plan provided today.    Juanell Fairly RN, BSN, [facility] Care Coordinator [facility]   Phone: [phone]

## 2022-10-07 ENCOUNTER — Other Ambulatory Visit: Payer: Self-pay | Admitting: *Deleted

## 2022-10-07 DIAGNOSIS — E118 Type 2 diabetes mellitus with unspecified complications: Secondary | ICD-10-CM

## 2022-10-07 MED ORDER — METFORMIN HCL 500 MG PO TABS: 1500.0000 mg | ORAL_TABLET | Freq: Every day | ORAL | 3 refills | Status: DC

## 2022-10-10 ENCOUNTER — Other Ambulatory Visit: Payer: Self-pay | Admitting: *Deleted

## 2022-10-10 MED ORDER — ACCU-CHEK GUIDE VI STRP: ORAL_STRIP | 0 refills | Status: DC

## 2022-10-17 ENCOUNTER — Ambulatory Visit: Payer: Self-pay

## 2022-10-17 NOTE — Patient Instructions (Signed)
Visit Information  Thank you for taking time to visit with me today. Please don't hesitate to contact me if I can be of assistance to you.   Following are the goals we discussed today:   Goals Addressed             This Visit's Progress    Patient will maintain blood sugars within his target range       Care Coordination Interventions: Reviewed medications with patient and discussed importance of medication adherence Counseled on importance of regular laboratory monitoring as prescribed Discussed plans with patient for ongoing care management follow up and provided patient with direct contact information for care management team Active listening / Reflection utilized  Emotional Support Provided  Lab Results  Component Value Date   HGBA1C 7.9 (A) 08/12/2022   @ Assessed patient's understanding of A1c goal: <7% Reviewed medications with patient and discussed importance of medication adherence;        Discussed plans with patient for ongoing care management follow up and provided patient with direct contact information for care management team;      call provider for findings outside established parameters;       Review of patient status, including review of consultants reports, relevant laboratory and other test results, and medications completed;               Our next appointment is by telephone on 11/17/22 at 2 pm  Please call the care guide team at [phone] if you need to cancel or reschedule your appointment.   If you are experiencing a Mental Health or Behavioral Health Crisis or need someone to talk to, please call 1-800-273-TALK (toll free, 24 hour hotline)  The patient verbalized understanding of instructions, educational materials, and care plan provided today.    Juanell Fairly RN, BSN, [facility] Care Coordinator [facility]   Phone: [phone]

## 2022-10-17 NOTE — Patient Outreach (Signed)
  Care Coordination   Follow Up Visit Note   10/17/2022 Name: Jordan Rodriguez MRN: [id number] DOB: February 09, 1951  Jordan Rodriguez is a 72 y.o. year old male who sees Jordan Fortis, MD for primary care. I spoke with  Jordan Rodriguez by phone today.  What matters to the patients health and wellness today?  Jordan Rodriguez is generally doing well, with his blood sugar levels ranging between 100 and 180. However, there have been two readings in the 200s and one in the 70s. He is making an effort to eat healthily and has no problems with sleeping. It's positive that he is walking for exercise, but it's suggested that he increases his walking activity for better health outcomes.    Goals Addressed             This Visit's Progress    Patient will maintain blood sugars within his target range       Care Coordination Interventions: Reviewed medications with patient and discussed importance of medication adherence Counseled on importance of regular laboratory monitoring as prescribed Discussed plans with patient for ongoing care management follow up and provided patient with direct contact information for care management team Active listening / Reflection utilized  Emotional Support Provided  Lab Results  Component Value Date   HGBA1C 7.9 (A) 08/12/2022   @ Assessed patient's understanding of A1c goal: <7% Reviewed medications with patient and discussed importance of medication adherence;        Discussed plans with patient for ongoing care management follow up and provided patient with direct contact information for care management team;      call provider for findings outside established parameters;       Review of patient status, including review of consultants reports, relevant laboratory and other test results, and medications completed;               SDOH assessments and interventions completed:  No     Care Coordination Interventions:  Yes, provided  Interventions  Today    Flowsheet Row Most Recent Value  Chronic Disease   Chronic disease during today's visit Diabetes  General Interventions   General Interventions Discussed/Reviewed General Interventions Discussed  Exercise Interventions   Exercise Discussed/Reviewed Exercise Discussed  Pharmacy Interventions   Pharmacy Dicussed/Reviewed Pharmacy Topics Discussed  Safety Interventions   Safety Discussed/Reviewed Safety Reviewed        Follow up plan: Follow up call scheduled for 11/17/22  2 pm    Encounter Outcome:  Pt. Visit Completed   Juanell Fairly RN, BSN, [facility] Care Coordinator [facility]   Phone: [phone]

## 2022-10-20 ENCOUNTER — Telehealth: Payer: Self-pay | Admitting: *Deleted

## 2022-10-20 NOTE — Progress Notes (Signed)
  Care Coordination Note  10/20/2022 Name: Jordan Rodriguez MRN: [id number] DOB: 08/22/1950  [name] is a 72 y.o. year old male who is a primary care patient of Elberta Fortis, MD and is actively engaged with the care management team. I reached out to Constance Goltz by phone today to assist with re-scheduling a follow up visit with the RN Case Manager  Follow up plan: Unsuccessful telephone outreach attempt made. A HIPAA compliant phone message was left for the patient providing contact information and requesting a return call.   [facility]  Care Coordination Care Guide  Direct Dial: [phone]

## 2022-10-27 NOTE — Progress Notes (Signed)
  Care Coordination Note  10/27/2022 Name: Jordan Rodriguez MRN: [id number] DOB: October 16, 1950  Jordan Rodriguez is a 72 y.o. year old male who is a primary care patient of Elberta Fortis, MD and is actively engaged with the care management team. I reached out to Constance Goltz by phone today to assist with re-scheduling a follow up visit with the RN Case Manager  Follow up plan: Telephone appointment with care management team member scheduled for:11/30/22  [facility] Coordination Care Guide  Direct Dial: [phone]

## 2022-11-14 DIAGNOSIS — N2581 Secondary hyperparathyroidism of renal origin: Secondary | ICD-10-CM | POA: Diagnosis not present

## 2022-11-14 DIAGNOSIS — E875 Hyperkalemia: Secondary | ICD-10-CM | POA: Diagnosis not present

## 2022-11-14 DIAGNOSIS — I5030 Unspecified diastolic (congestive) heart failure: Secondary | ICD-10-CM | POA: Diagnosis not present

## 2022-11-14 DIAGNOSIS — E872 Acidosis, unspecified: Secondary | ICD-10-CM | POA: Diagnosis not present

## 2022-11-14 DIAGNOSIS — N1832 Chronic kidney disease, stage 3b: Secondary | ICD-10-CM | POA: Diagnosis not present

## 2022-11-14 DIAGNOSIS — D631 Anemia in chronic kidney disease: Secondary | ICD-10-CM | POA: Diagnosis not present

## 2022-11-14 DIAGNOSIS — N185 Chronic kidney disease, stage 5: Secondary | ICD-10-CM | POA: Diagnosis not present

## 2022-11-14 DIAGNOSIS — I120 Hypertensive chronic kidney disease with stage 5 chronic kidney disease or end stage renal disease: Secondary | ICD-10-CM | POA: Diagnosis not present

## 2022-11-14 DIAGNOSIS — I129 Hypertensive chronic kidney disease with stage 1 through stage 4 chronic kidney disease, or unspecified chronic kidney disease: Secondary | ICD-10-CM | POA: Diagnosis not present

## 2022-11-14 DIAGNOSIS — E1122 Type 2 diabetes mellitus with diabetic chronic kidney disease: Secondary | ICD-10-CM | POA: Diagnosis not present

## 2022-11-16 ENCOUNTER — Other Ambulatory Visit: Payer: Self-pay | Admitting: *Deleted

## 2022-11-16 MED ORDER — LANTUS SOLOSTAR 100 UNIT/ML ~~LOC~~ SOPN: 10.0000 [IU] | PEN_INJECTOR | Freq: Every day | SUBCUTANEOUS | 0 refills | Status: DC

## 2022-11-24 ENCOUNTER — Other Ambulatory Visit: Payer: Self-pay

## 2022-11-24 DIAGNOSIS — E875 Hyperkalemia: Secondary | ICD-10-CM | POA: Diagnosis not present

## 2022-11-24 MED ORDER — EMPAGLIFLOZIN 25 MG PO TABS: 25.0000 mg | ORAL_TABLET | Freq: Every day | ORAL | 0 refills | Status: DC

## 2022-11-29 ENCOUNTER — Other Ambulatory Visit: Payer: Self-pay

## 2022-11-29 MED ORDER — ATORVASTATIN CALCIUM 40 MG PO TABS: 40.0000 mg | ORAL_TABLET | Freq: Every day | ORAL | 3 refills | Status: DC

## 2022-11-30 ENCOUNTER — Ambulatory Visit: Payer: Self-pay

## 2022-11-30 NOTE — Patient Instructions (Signed)
Visit Information  Thank you for taking time to visit with me today. Please don't hesitate to contact me if I can be of assistance to you.   Following are the goals we discussed today:   Goals Addressed             This Visit's Progress    Patient will maintain blood sugars within his target range       Care Coordination Interventions: Counseled on importance of regular laboratory monitoring as prescribed Discussed plans with patient for ongoing care management follow up and provided patient with direct contact information for care management team Active listening / Reflection utilized  Emotional Support Provided  Lab Results  Component Value Date   HGBA1C 7.9 (A) 08/12/2022   @ Assessed patient's understanding of A1c goal: <7% call provider for findings outside established parameters;       Review of patient status, including review of consultants reports, relevant laboratory and other test results, and medications completed;               Our next appointment is by telephone on 01/03/23 at 2 pm  Please call the care guide team at [phone] if you need to cancel or reschedule your appointment.   If you are experiencing a Mental Health or Behavioral Health Crisis or need someone to talk to, please call 1-800-273-TALK (toll free, 24 hour hotline)  The patient verbalized understanding of instructions, educational materials, and care plan provided today.    Juanell Fairly RN, BSN, [facility] [employer] Phone: [phone]

## 2022-11-30 NOTE — Patient Outreach (Signed)
Care Coordination   Follow Up Visit Note   11/30/2022 Name: Jordan Rodriguez MRN: [id number] DOB: December 26, 1950  Jordan Rodriguez is a 72 y.o. year old male who sees Jordan Fortis, MD for primary care. I spoke with  Jordan Rodriguez by phone today.  What matters to the patients health and wellness today?  [name] mentioned that he is doing okay. He visited his nephrologist two weeks ago, and his potassium levels were high. He has been taking medication to lower it and recently had a blood test. He is currently waiting for the results, and he mentioned that he should receive a call today. If not, he plans to call them. We also discussed the importance of monitoring his blood sugars, which he reported as 113 this morning. He hasn't experienced any low blood sugar levels; the lowest recorded was 79. I advised him to continue monitoring his food and fluid intake, take his medications, and aim to get more exercise.     Goals Addressed             This Visit's Progress    Patient will maintain blood sugars within his target range       Care Coordination Interventions: Counseled on importance of regular laboratory monitoring as prescribed Discussed plans with patient for ongoing care management follow up and provided patient with direct contact information for care management team Active listening / Reflection utilized  Emotional Support Provided  Lab Results  Component Value Date   HGBA1C 7.9 (A) 08/12/2022   @ Assessed patient's understanding of A1c goal: <7% call provider for findings outside established parameters;       Review of patient status, including review of consultants reports, relevant laboratory and other test results, and medications completed;               SDOH assessments and interventions completed:  No     Care Coordination Interventions:  Yes, provided   Interventions Today    Flowsheet Row Most Recent Value  Chronic Disease   Chronic disease  during today's visit Diabetes  General Interventions   General Interventions Discussed/Reviewed General Interventions Discussed, Durable Medical Equipment (DME)  Durable Medical Equipment (DME) Glucomoter  Exercise Interventions   Exercise Discussed/Reviewed Exercise Discussed  Pharmacy Interventions   Pharmacy Dicussed/Reviewed Pharmacy Topics Discussed  Safety Interventions   Safety Discussed/Reviewed Safety Discussed        Follow up plan: Follow up call scheduled for 01/03/23 2 pm    Encounter Outcome:  Patient Visit Completed   Juanell Fairly RN, BSN, [facility]   Care Coordinator Phone: [phone]

## 2022-12-30 DIAGNOSIS — E119 Type 2 diabetes mellitus without complications: Secondary | ICD-10-CM | POA: Diagnosis not present

## 2023-01-03 ENCOUNTER — Ambulatory Visit: Payer: Self-pay

## 2023-01-03 NOTE — Patient Outreach (Signed)
  Care Coordination   Follow Up Visit Note   01/03/2023 Name: Jordan Rodriguez MRN: [id number] DOB: 09-02-1950  Jordan Rodriguez is a 72 y.o. year old male who sees Jordan Fortis, MD for primary care. I spoke with  Jordan Rodriguez by phone today.  What matters to the patients health and wellness today?  I spoke with [name], who reported that he is doing well. This morning, his blood sugar was 103, and at noon, it rose to 182. Yesterday, after eating, his blood sugar level was 85. During fasting, he recorded levels of 126 at noon and 1:33 PM. [name] mentioned that he is monitoring his food intake and taking his medications as prescribed. He has a follow-up appointment with his primary care physician (PCP) scheduled for December 17th at 1:30 PM to discuss his diabetes. I informed [name] that this would be our last conversation, as he will be transitioned to case worker Jordan Rodriguez, who also has an appointment with him.    Goals Addressed             This Visit's Progress    Patient will maintain blood sugars within his target range         Care Coordination Interventions: Counseled on importance of regular laboratory monitoring as prescribed Discussed plans with patient for ongoing care management follow up and provided patient with direct contact information for care management team Active listening / Reflection utilized  Emotional Support Provided  Lab Results  Component Value Date   HGBA1C 7.9 (A) 08/12/2022   @ Assessed patient's understanding of A1c goal: <7% call provider for findings outside established parameters;       Review of patient status, including review of consultants reports, relevant laboratory and other test results, and medications completed;   Continue to monitor blood sugars and watch for lows Reviewed hypoglycemic protocol             SDOH assessments and interventions completed:  No     Care Coordination Interventions:  Yes,  provided   Interventions Today    Flowsheet Row Most Recent Value  Chronic Disease   Chronic disease during today's visit Diabetes  General Interventions   General Interventions Discussed/Reviewed General Interventions Discussed, General Interventions Reviewed  Exercise Interventions   Exercise Discussed/Reviewed Exercise Discussed  Education Interventions   Education Provided Provided Education  Nutrition Interventions   Nutrition Discussed/Reviewed Nutrition Discussed  Pharmacy Interventions   Pharmacy Dicussed/Reviewed Pharmacy Topics Discussed  Safety Interventions   Safety Discussed/Reviewed Safety Discussed        Follow up plan: No further intervention required.   Encounter Outcome:  Patient Visit Completed   Jordan Fairly RN, BSN, [facility]  [facility], [facility]  Care Coordinator Phone: [phone]

## 2023-01-03 NOTE — Patient Instructions (Signed)
Visit Information  Thank you for taking time to visit with me today. Please don't hesitate to contact me if I can be of assistance to you.   Following are the goals we discussed today:   Goals Addressed             This Visit's Progress    Patient will maintain blood sugars within his target range         Care Coordination Interventions: Counseled on importance of regular laboratory monitoring as prescribed Discussed plans with patient for ongoing care management follow up and provided patient with direct contact information for care management team Active listening / Reflection utilized  Emotional Support Provided  Lab Results  Component Value Date   HGBA1C 7.9 (A) 08/12/2022   @ Assessed patient's understanding of A1c goal: <7% call provider for findings outside established parameters;       Review of patient status, including review of consultants reports, relevant laboratory and other test results, and medications completed;   Continue to monitor blood sugars and watch for lows Reviewed hypoglycemic protocol             If you are experiencing a Mental Health or Behavioral Health Crisis or need someone to talk to, please call 1-800-273-TALK (toll free, 24 hour hotline)  The patient verbalized understanding of instructions, educational materials, and care plan provided today.    No further follow up required  Juanell Fairly RN, BSN, [facility] [place]  [facility], [facility]  Care Coordinator Phone: [phone]

## 2023-01-17 ENCOUNTER — Other Ambulatory Visit: Payer: Self-pay | Admitting: Family Medicine

## 2023-01-19 ENCOUNTER — Other Ambulatory Visit: Payer: Self-pay | Admitting: Family Medicine

## 2023-02-06 ENCOUNTER — Other Ambulatory Visit: Payer: Self-pay | Admitting: *Deleted

## 2023-02-06 ENCOUNTER — Telehealth: Payer: Self-pay | Admitting: *Deleted

## 2023-02-06 NOTE — Patient Outreach (Signed)
  Care Management   Follow Up Note   02/06/2023 Name: Jordan Rodriguez MRN: [id number] DOB: 1950/08/07   Referred by: Elberta Fortis, MD Reason for referral : Care Management (RNCM: ATTEMPT To Follow Up For Chronic Disease Management & Care Coordination Needs)   An unsuccessful telephone outreach was attempted today. The patient was referred to the case management team for assistance with care management and care coordination.   Follow Up Plan: The care management team will reach out to the patient again over the next 30 days.   Danise Edge, BSN RN RN Care Manager  [place]  Ambulatory Care Management  Direct Number: [phone]

## 2023-02-13 NOTE — Progress Notes (Signed)
    SUBJECTIVE:   CHIEF COMPLAINT / HPI:   Diabetes Current Regimen: Jardiance 25mg  daily, Lantus 10U daily, Metformin 1,500mg  daily CBGs: Fasting between 90 and 130. Daytime readings mostly in 100-200s but some readings in 300s. No hypoglycemia.   Last A1c:  Lab Results  Component Value Date   HGBA1C 7.9 (A) 08/12/2022    Denies polyuria, polydipsia, hypoglycemia. Last Eye Exam: 10/2022 - normal. [facility] Statin: Atorvastatin 40mg  daily ACE/ARB: Losartan 50mg  daily   PERTINENT  PMH / PSH: T2DM  OBJECTIVE:   BP 132/72   Pulse 62   Wt 125 lb (56.7 kg)   SpO2 99%   BMI 20.80 kg/m    General: Alert, no apparent distress, well groomed HEENT: Normocephalic, atraumatic, moist mucus membranes, neck supple Respiratory: Normal respiratory effort GI: Non-distended Skin: No rashes, no jaundice Psych: Appropriate mood and affect Foot: No signs of ulceration, skin breakdown or infection noted.  2+ dorsalis pedis pulses bilaterally.  Sensation intact.  ASSESSMENT/PLAN:   Assessment & Plan Type 2 diabetes mellitus with hyperglycemia, with long-term current use of insulin (HCC) A1c 8.2, mildly increased from 7.9 in 07/2022.  BGL mostly controlled with fasting sugar 80-120 and some postprandial highs to the 300s but no episodes of hypoglycemia.  Ideally goal HbA1c <8 given age.  Consider titration of metformin dosing to 1000 mg twice daily but no recent BMP therefore we will check today and follow-up.  UTD on eye exam -[facility]. -BMP and ACR -Refilled Jardiance 25 mg daily -Refill Atorvastatin 40 mg daily Stage 3b chronic kidney disease (HCC) Follows with nephrology, stable per patient report Encounter for immunization Received flu and Prevnar-20 vaccine    Dr. Elberta Fortis, DO [facility]

## 2023-02-14 ENCOUNTER — Ambulatory Visit (INDEPENDENT_AMBULATORY_CARE_PROVIDER_SITE_OTHER): Payer: 59 | Admitting: Family Medicine

## 2023-02-14 ENCOUNTER — Encounter: Payer: Self-pay | Admitting: Family Medicine

## 2023-02-14 VITALS — BP 132/72 | HR 62 | Wt 125.0 lb

## 2023-02-14 DIAGNOSIS — Z794 Long term (current) use of insulin: Secondary | ICD-10-CM | POA: Diagnosis not present

## 2023-02-14 DIAGNOSIS — Z23 Encounter for immunization: Secondary | ICD-10-CM | POA: Diagnosis not present

## 2023-02-14 DIAGNOSIS — N1832 Chronic kidney disease, stage 3b: Secondary | ICD-10-CM | POA: Diagnosis not present

## 2023-02-14 DIAGNOSIS — E1165 Type 2 diabetes mellitus with hyperglycemia: Secondary | ICD-10-CM

## 2023-02-14 LAB — POCT GLYCOSYLATED HEMOGLOBIN (HGB A1C): HbA1c, POC (controlled diabetic range): 8.2 % — AB (ref 0.0–7.0)

## 2023-02-14 MED ORDER — EMPAGLIFLOZIN 25 MG PO TABS: 25.0000 mg | ORAL_TABLET | Freq: Every day | ORAL | 0 refills | Status: DC

## 2023-02-14 MED ORDER — ATORVASTATIN CALCIUM 40 MG PO TABS: 40.0000 mg | ORAL_TABLET | Freq: Every day | ORAL | 3 refills | Status: AC

## 2023-02-14 NOTE — Assessment & Plan Note (Signed)
A1c 8.2, mildly increased from 7.9 in 07/2022.  BGL mostly controlled with fasting sugar 80-120 and some postprandial highs to the 300s but no episodes of hypoglycemia.  Ideally goal HbA1c <8 given age.  Consider titration of metformin dosing to 1000 mg twice daily but no recent BMP therefore we will check today and follow-up.  UTD on eye exam -[facility]. -BMP and ACR -Refilled Jardiance 25 mg daily -Refill Atorvastatin 40 mg daily

## 2023-02-14 NOTE — Assessment & Plan Note (Signed)
Follows with nephrology, stable per patient report

## 2023-02-14 NOTE — Patient Instructions (Addendum)
It was wonderful to see you today! Thank you for choosing [facility].   Please bring ALL of your medications with you to every visit.   Today we talked about:  Please get your diabetic eye exam done yearly and have your doctor fax the records to [place].  We are checking some lab work for your diabetes today. I will let you know the results and if any changes. Depending on your kidney function we may need to adjust your Metformin dosing further. Your A1c is 8.2 which is close to goal but we do need to recheck in 3 months to ensure it is not creeping up further. I also refilled your medications.  Please follow up in 3 months for diabetes care  If you haven't already, sign up for My Chart to have easy access to your labs results, and communication with your primary care physician.   We are checking some labs today. If they are abnormal, I will call you. If they are normal, I will send you a MyChart message (if it is active) or a letter in the mail. If you do not hear about your labs in the next 2 weeks, please call the office.  Call the clinic at [phone] if your symptoms worsen or you have any concerns.  Please be sure to schedule follow up at the front desk before you leave today.   Elberta Fortis, DO Family Medicine

## 2023-02-15 LAB — BASIC METABOLIC PANEL
BUN/Creatinine Ratio: 14 (ref 10–24)
BUN: 26 mg/dL (ref 8–27)
CO2: 20 mmol/L (ref 20–29)
Calcium: 8.2 mg/dL — ABNORMAL LOW (ref 8.6–10.2)
Chloride: 102 mmol/L (ref 96–106)
Creatinine, Ser: 1.82 mg/dL — ABNORMAL HIGH (ref 0.76–1.27)
Glucose: 180 mg/dL — ABNORMAL HIGH (ref 70–99)
Potassium: 4.9 mmol/L (ref 3.5–5.2)
Sodium: 138 mmol/L (ref 134–144)
eGFR: 39 mL/min/{1.73_m2} — ABNORMAL LOW (ref >59)

## 2023-02-15 LAB — MICROALBUMIN / CREATININE URINE RATIO
Creatinine, Urine: 22.5 mg/dL
Microalb/Creat Ratio: 216 mg/g{creat} — ABNORMAL HIGH (ref 0–29)
Microalbumin, Urine: 48.7 ug/mL

## 2023-02-17 ENCOUNTER — Telehealth: Payer: Self-pay | Admitting: Family Medicine

## 2023-02-17 NOTE — Telephone Encounter (Signed)
Called patient to discuss lab results.  ACR moderately elevated, CKD stage IIIb and was mildly improved from prior.  A1c 8.2, mildly above goal <8.  Opted not to adjust metformin dosing further given continued CKD.  Discussed adjusting Lantus dosing but fasting sugars mostly controlled therefore opting for repeat A1c in 3 months and will discuss insulin titration if worsening.  Patient reports scheduled nephrology follow-up in 03/2023, recommended further discussion at continued moderately increased microalbuminuria.  Patient already on SGLT2 and ARB therapy.  Answered all questions.  Elberta Fortis, DO

## 2023-03-27 ENCOUNTER — Other Ambulatory Visit: Payer: 59 | Admitting: *Deleted

## 2023-03-27 ENCOUNTER — Telehealth: Payer: Self-pay | Admitting: *Deleted

## 2023-03-27 ENCOUNTER — Encounter: Payer: Self-pay | Admitting: *Deleted

## 2023-03-27 ENCOUNTER — Other Ambulatory Visit: Payer: Self-pay | Admitting: *Deleted

## 2023-03-27 DIAGNOSIS — Z599 Problem related to housing and economic circumstances, unspecified: Secondary | ICD-10-CM

## 2023-03-27 NOTE — Progress Notes (Signed)
Complex Care Management Care Guide Note  03/27/2023 Name: Jordan Rodriguez MRN: [id number] DOB: 1950/05/16  Jordan Rodriguez is a 73 y.o. year old male who is a primary care patient of Elberta Fortis, MD and is actively engaged with the care management team. I reached out to Constance Goltz by phone today to assist with scheduling  with the BSW.  Follow up plan: Telephone appointment with complex care management team member scheduled for:  04/18/23  Gwenevere Ghazi  [facility]  Value-Based Care Institute, [facility] Guide  Direct Dial: [phone]  Fax [phone]

## 2023-03-27 NOTE — Patient Instructions (Signed)
Visit Information  Thank you for taking time to visit with me today. Please don't hesitate to contact me if I can be of assistance to you before our next scheduled telephone appointment.  Following are the goals we discussed today:   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  Care Coordination needs related to Transportation and Limited access to food Chronic Disease Management support and education needs related to DMII and CKD Stage 3B  Financial Constraints  Transportation barriers  RNCM Clinical Goal(s):  Patient will verbalize basic understanding of  DMII and CKD Stage 3B disease process and self health management plan as evidenced by verbal explanation, recognizing symptoms, lifestyle modifications attend all scheduled medical appointments: with primary care provider and specialist as evidenced by keeping all scheduled appointments demonstrate Improved and Ongoing adherence to prescribed treatment plan for DMII and CKD Stage 3B as evidenced by consistent medication compliance, symptom monitoring, continued lifestyle changes continue to work with RN Care Manager to address care management and care coordination needs related to  DMII and CKD Stage 3B as evidenced by adherence to CM Team Scheduled appointments work with Child psychotherapist to address  related to the management of Transportation and Limited access to food related to the management of DMII and CKD Stage 3B as evidenced by review of EMR and patient or Child psychotherapist report work with Publishing rights manager care guide to address needs related to  Limited access to food as evidenced by patient and/or community resource care guide support through collaboration with Medical illustrator, provider, and care team.    Interventions: Evaluation of current treatment plan related to  self management and patient's adherence to plan as established by provider    Chronic Kidney Disease Interventions:  (Status:  Condition stable.  Not addressed this visit.) Long Term Goal Assessed the Patient understanding of chronic kidney disease    Evaluation of current treatment plan related to chronic kidney disease self management and patient's adherence to plan as established by provider      Provided education to patient re: stroke prevention, s/s of heart attack and stroke    Reviewed prescribed diet ADA/Heart Healthy Reviewed medications with patient and discussed importance of compliance. Reports compliance with all medications.    Counseled on adverse effects of illicit drug and excessive alcohol use in patients with chronic kidney disease    Counseled on the importance of exercise goals with target of 150 minutes per week     Advised patient, providing education and rationale, to monitor blood pressure daily and record, calling PCP for findings outside established parameters. Patient states that he needs a new BP machine, RNCM advised patient reach out to his health plan to see if they have any allocations for him to obtain onel Discussed complications of poorly controlled blood pressure such as heart disease, stroke, circulatory complications, vision complications, kidney impairment, sexual dysfunction    Reviewed scheduled/upcoming provider appointments including. Patient reports he has a nephrology follow up March 2025    Advised patient to discuss any acute changes with provider    Discussed plans with patient for ongoing care management follow up and provided patient with direct contact information for care management team  Screening for signs and symptoms of depression related to chronic disease state      Discussed the impact of chronic kidney disease on daily life and mental health and acknowledged and  normalized feelings of disempowerment, fear, and frustration    Assessed social determinant of health barriers    Provided education on kidney disease progression    Last practice recorded BP readings:  BP Readings from Last 3 Encounters:  02/14/23 132/72  08/12/22 139/64  05/16/22 138/78   Most recent eGFR/CrCl:  Lab Results  Component Value Date   EGFR 39 (L) 02/14/2023    No components found for: "CRCL"    Diabetes Interventions:  (Status:  Goal on track:  Yes.) Long Term Goal Assessed patient's understanding of A1c goal: <8% Provided education to patient about basic DM disease process. A1C remains above goal at this time. Checks blood sugar as advised. Reports fasting glucose this morning of 98, lowest: 75 highest: 258. Reports no acute changes with his DM at this time. Reviewed medications with patient and discussed importance of medication adherence. Reports compliance with all medications. Counseled on importance of regular laboratory monitoring as prescribed Discussed plans with patient for ongoing care management follow up and provided patient with direct contact information for care management team Provided patient with written educational materials related to hypo and hyperglycemia and importance of correct treatment Reviewed scheduled/upcoming provider appointments including: 05-01-2023 AWV Advised patient, providing education and rationale, to check cbg as recommended and record, calling provider for findings outside established parameters. RNCM reviwed with patient fasting goal <130 and post prandial <180, patient verbalized full understanding. Referral made to social work team for assistance with resources for food and transportation Referral made to community resources care guide team for assistance with food bank resources Review of patient status, including review of consultants reports, relevant laboratory and other test results, and medications completed Screening for  signs and symptoms of depression related to chronic disease state  Assessed social determinant of health barriers Lab Results  Component Value Date   HGBA1C 8.2 (A) 02/14/2023    Patient Goals/Self-Care Activities: Take all medications as prescribed Attend all scheduled provider appointments Call pharmacy for medication refills 3-7 days in advance of running out of medications Attend church or other social activities Perform all self care activities independently  Perform IADL's (shopping, preparing meals, housekeeping, managing finances) independently Call provider office for new concerns or questions  Work with the social worker to address care coordination needs and will continue to work with the clinical team to address health care and disease management related needs schedule appointment with eye doctor check feet daily for cuts, sores or redness enter blood sugar readings and medication or insulin into daily log drink 6 to 8 glasses of water each day fill half of plate with vegetables keep feet up while sitting wash and dry feet carefully every day wear comfortable, cotton socks wear comfortable, well-fitting shoes  Follow Up Plan:  Telephone follow up appointment with care management team member scheduled for:  04-27-2023 at 9:45 am           Our next appointment is by telephone on 04-27-2023 at 9:45 am  Please call the care guide team at [phone] if you need to cancel or reschedule your appointment.   If you are experiencing a Mental Health or Behavioral Health Crisis or need someone to talk to, please call the Suicide and Crisis Lifeline: 988 call the [place] National Suicide Prevention Lifeline: 2512782115 or TTY:  (618)296-2073 TTY 919-094-1001) to talk to a trained counselor call 1-800-273-TALK (toll free, 24 hour hotline) go to [facility] [address] [place] [phone])   The patient verbalized understanding of  instructions, educational materials, and care plan provided today and DECLINED offer to receive copy of patient instructions, educational materials, and care plan.   Telephone follow up appointment with care management team member scheduled for:04-27-2023 at 9:45 am  Rosalene Billings, BSN RN [facility] RN Care Manager Direct Dial: [phone]  Fax: [phone]

## 2023-03-27 NOTE — Patient Outreach (Signed)
Care Management   Visit Note  03/27/2023 Name: Jordan Rodriguez MRN: [id number] DOB: December 27, 1950  Subjective: Jordan Rodriguez is a 73 y.o. year old male who is a primary care patient of Elberta Fortis, MD. The Care Management team was consulted for assistance.      Engaged with patient spoke with patient by telephone.    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  Care Coordination needs related to Transportation and Limited access to food Chronic Disease Management support and education needs related to DMII and CKD Stage 3B  Financial Constraints  Transportation barriers  RNCM Clinical Goal(s):  Patient will verbalize basic understanding of  DMII and CKD Stage 3B disease process and self health management plan as evidenced by verbal explanation, recognizing symptoms, lifestyle modifications attend all scheduled medical appointments: with primary care provider and specialist as evidenced by keeping all scheduled appointments demonstrate Improved and Ongoing adherence to prescribed treatment plan for DMII and CKD Stage 3B as evidenced by consistent medication compliance, symptom monitoring, continued lifestyle changes continue to work with RN Care Manager to address care management and care coordination needs related to  DMII and CKD Stage 3B as evidenced by adherence to CM Team Scheduled appointments work with Child psychotherapist to address  related to the management of Transportation and Limited access to food related to the management of DMII and CKD Stage 3B as evidenced by review of EMR and patient or Child psychotherapist report work with Publishing rights manager care guide to address needs related to  Limited access to food as evidenced by patient and/or community resource care guide  support through collaboration with Medical illustrator, provider, and care team.   Interventions: Evaluation of current treatment plan related to  self management and patient's adherence to plan as established by provider    Chronic Kidney Disease Interventions:  (Status:  Condition stable.  Not addressed this visit.) Long Term Goal Assessed the Patient understanding of chronic kidney disease    Evaluation of current treatment plan related to chronic kidney disease self management and patient's adherence to plan as established by provider      Provided education to patient re: stroke prevention, s/s of heart attack and stroke    Reviewed prescribed diet ADA/Heart Healthy Reviewed medications with patient and discussed importance of compliance. Reports compliance with all medications.    Counseled on adverse effects of illicit drug and excessive alcohol use in patients with chronic kidney disease    Counseled on the importance of exercise goals with target of 150 minutes per week     Advised patient, providing education and rationale, to monitor blood pressure daily and record, calling PCP for findings outside established parameters. Patient states that he needs a new BP machine, RNCM advised patient reach out to his health plan to see if they have any allocations for him to obtain onel Discussed complications of poorly controlled blood pressure such as heart disease, stroke, circulatory complications, vision complications, kidney impairment, sexual dysfunction    Reviewed scheduled/upcoming provider appointments including. Patient reports he has a nephrology follow up March 2025    Advised patient to discuss any acute changes with provider    Discussed plans with patient for  ongoing care management follow up and provided patient with direct contact information for care management team    Screening for signs and symptoms of depression related to chronic disease state      Discussed the impact of  chronic kidney disease on daily life and mental health and acknowledged and normalized feelings of disempowerment, fear, and frustration    Assessed social determinant of health barriers    Provided education on kidney disease progression    Last practice recorded BP readings:  BP Readings from Last 3 Encounters:  02/14/23 132/72  08/12/22 139/64  05/16/22 138/78   Most recent eGFR/CrCl:  Lab Results  Component Value Date   EGFR 39 (L) 02/14/2023    No components found for: "CRCL"    Diabetes Interventions:  (Status:  Goal on track:  Yes.) Long Term Goal Assessed patient's understanding of A1c goal: <8% Provided education to patient about basic DM disease process. A1C remains above goal at this time. Checks blood sugar as advised. Reports fasting glucose this morning of 98, lowest: 75 highest: 258. Reports no acute changes with his DM at this time. Reviewed medications with patient and discussed importance of medication adherence. Reports compliance with all medications. Counseled on importance of regular laboratory monitoring as prescribed Discussed plans with patient for ongoing care management follow up and provided patient with direct contact information for care management team Provided patient with written educational materials related to hypo and hyperglycemia and importance of correct treatment Reviewed scheduled/upcoming provider appointments including: 05-01-2023 AWV Advised patient, providing education and rationale, to check cbg as recommended and record, calling provider for findings outside established parameters. RNCM reviwed with patient fasting goal <130 and post prandial <180, patient verbalized full understanding. Referral made to social work team for assistance with resources for food and transportation Referral made to community resources care guide team for assistance with food bank resources Review of patient status, including review of consultants reports, relevant  laboratory and other test results, and medications completed Screening for signs and symptoms of depression related to chronic disease state  Assessed social determinant of health barriers Lab Results  Component Value Date   HGBA1C 8.2 (A) 02/14/2023    Patient Goals/Self-Care Activities: Take all medications as prescribed Attend all scheduled provider appointments Call pharmacy for medication refills 3-7 days in advance of running out of medications Attend church or other social activities Perform all self care activities independently  Perform IADL's (shopping, preparing meals, housekeeping, managing finances) independently Call provider office for new concerns or questions  Work with the social worker to address care coordination needs and will continue to work with the clinical team to address health care and disease management related needs schedule appointment with eye doctor check feet daily for cuts, sores or redness enter blood sugar readings and medication or insulin into daily log drink 6 to 8 glasses of water each day fill half of plate with vegetables keep feet up while sitting wash and dry feet carefully every day wear comfortable, cotton socks wear comfortable, well-fitting shoes  Follow Up Plan:  Telephone follow up appointment with care management team member scheduled for:  04-27-2023 at 9:45 am           Consent to Services:  Patient was given information about care management services, agreed to services, and gave verbal consent to participate.   Plan: Telephone follow up appointment with care management team member scheduled for:04-27-2023 at 9:45 am  Rosalene Billings, BSN RN [facility]  Value-Based  Care Institute, [facility] RN Care Manager Direct Dial: [phone]  Fax: [phone]

## 2023-03-29 ENCOUNTER — Telehealth: Payer: Self-pay | Admitting: *Deleted

## 2023-03-29 NOTE — Progress Notes (Signed)
Complex Care Management Note Care Guide Note  03/29/2023 Name: Jordan Rodriguez MRN: [id number] DOB: Jul 04, 1950  Jordan Rodriguez is a 73 y.o. year old male who is a primary care patient of Elberta Fortis, MD . The community resource team was consulted for assistance with Transportation Needs  Food   SDOH screenings and interventions completed:  Yes  SDOH Interventions Today    Flowsheet Row Most Recent Value  SDOH Interventions   Food Insecurity Interventions Community Resources Provided, Other (Comment), [id number] Referral  [has [employer] in [place] cares 360 referal also mailed food banks]  Transportation Interventions [id number] Referral, Payor Benefit  [Has 24 oneway rides through [facility] declined the access ]        Care guide performed the following interventions: Patient provided with information about care guide support team and interviewed to confirm resource needs.  Follow Up Plan:  No further follow up planned at this time. The patient has been provided with needed resources.  Encounter Outcome:  Patient Visit Completed  Dione Booze  [facility] Guide  Direct Dial:[phone] [phone] Website: [email or web address]

## 2023-04-03 ENCOUNTER — Other Ambulatory Visit: Payer: Self-pay | Admitting: Family Medicine

## 2023-04-18 ENCOUNTER — Ambulatory Visit: Payer: Self-pay | Admitting: Licensed Clinical Social Worker

## 2023-04-18 NOTE — Patient Outreach (Signed)
  Care Coordination   Initial Visit Note   04/18/2023 Name: Jordan Rodriguez MRN: [id number] DOB: 30-Jul-1950  Jordan Rodriguez is a 73 y.o. year old male who sees Elberta Fortis, MD for primary care. I spoke with  Constance Goltz by phone today.  What matters to the patients health and wellness today?  Transportation, Physiological scientist Strain     Goals Addressed             This Visit's Progress    Care Coordination Activities       Care Coordination Interventions: Patient stated that he doe shave times that his food does run low, he does receive $147 in food stamps. Patient stated that he is ok on food currently an that his daughter does make sure that he has enoug. Patient stated that someone was suppose to send him a list of food pantries but he never received I. Sw will mail out the food pantry list.  Patient stated that as for as transportation that his daughter takes him to his appointments and to other places that he need to go too. SW discussed the [employer]/[place] [employer] and the patient stated he is ok with his daughter taking him places and has better trust with her currently but stated that if changes his mind he will let the SW know if he wants to apply for SCAT. Patient stated that he does have some financial strain but is currently is working on pay some of his bill down and has a plan.  SW will follow up on 05/03/2023 at 2:00 pm        SDOH assessments and interventions completed:  Yes  SDOH Interventions Today    Flowsheet Row Most Recent Value  SDOH Interventions   Food Insecurity Interventions Community Resources Provided, Other (Comment)  [SW will mail food pantry list]  Housing Interventions Intervention Not Indicated  Transportation Interventions Intervention Not Indicated  [Family does assist wit transportation and more comfortable with daughter taking him doctors appointments, SW discussed SCAT and Patient declined]  Utilities  Interventions Community Resources Provided  [ok right not but wanted information mailed if needed in the future.]  Financial Strain Interventions Other (Comment)  [Patient has a plan of paying off bills]        Care Coordination Interventions:  Yes, provided  Interventions Today    Flowsheet Row Most Recent Value  General Interventions   General Interventions Discussed/Reviewed General Interventions Discussed, [employer]  [SW will mail out food pantry list and utility assistance list]        Follow up plan: Follow up call scheduled for 05/03/2023 at 2:00 pm    Encounter Outcome:  Patient Visit Completed   Jeanie Cooks, PhD [facility], [facility] Social Worker Direct Dial: [phone]  Fax: [phone]

## 2023-04-18 NOTE — Patient Instructions (Signed)
Visit Information  Thank you for taking time to visit with me today. Please don't hesitate to contact me if I can be of assistance to you.   Following are the goals we discussed today:   Goals Addressed             This Visit's Progress    Care Coordination Activities       Care Coordination Interventions: Patient stated that he doe shave times that his food does run low, he does receive $147 in food stamps. Patient stated that he is ok on food currently an that his daughter does make sure that he has enoug. Patient stated that someone was suppose to send him a list of food pantries but he never received I. Sw will mail out the food pantry list.  Patient stated that as for as transportation that his daughter takes him to his appointments and to other places that he need to go too. SW discussed the SCAT/ Access and the patient stated he is ok with his daughter taking him places and has better trust with her currently but stated that if changes his mind he will let the SW know if he wants to apply for SCAT. Patient stated that he does have some financial strain but is currently is working on pay some of his bill down and has a plan.  SW will follow up on 05/03/2023 at 2:00 pm        Our next appointment is by telephone on 05/03/2023 at 2:00 pm  Please call the care guide team at [phone] if you need to cancel or reschedule your appointment.   If you are experiencing a Mental Health or Behavioral Health Crisis or need someone to talk to, please call the Suicide and Crisis Lifeline: 988 go to [facility] [address], [place] [phone]) call 911  The patient verbalized understanding of instructions, educational materials, and care plan provided today and DECLINED offer to receive copy of patient instructions, educational materials, and care plan.   Jeanie Cooks, PhD [facility], [facility] Social Worker Direct Dial: [phone]  Fax: [phone]

## 2023-04-20 ENCOUNTER — Telehealth: Payer: Self-pay | Admitting: *Deleted

## 2023-04-20 NOTE — Telephone Encounter (Signed)
 Patient was identified as falling into the [place] Measure - Diabetes.   Patient was: Left voicemail to schedule with primary care provider.

## 2023-04-27 ENCOUNTER — Other Ambulatory Visit: Payer: Self-pay | Admitting: *Deleted

## 2023-04-27 NOTE — Patient Outreach (Signed)
 Care Management   Visit Note  04/27/2023 Name: Jordan Rodriguez MRN: [id number] DOB: 1950-07-10  Subjective: Jordan Rodriguez is a 73 y.o. year old male who is a primary care patient of Elberta Fortis, MD. The Care Management team was consulted for assistance.      Engaged with patient spoke with patient by telephone.    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  Care Coordination needs related to Transportation and Limited access to food Chronic Disease Management support and education needs related to DMII and CKD Stage 3B  Financial Constraints  Transportation barriers  RNCM Clinical Goal(s):  Patient will verbalize basic understanding of  DMII and CKD Stage 3B disease process and self health management plan as evidenced by verbal explanation, recognizing symptoms, lifestyle modifications attend all scheduled medical appointments: with primary care provider and specialist as evidenced by keeping all scheduled appointments demonstrate Improved and Ongoing adherence to prescribed treatment plan for DMII and CKD Stage 3B as evidenced by consistent medication compliance, symptom monitoring, continued lifestyle changes continue to work with RN Care Manager to address care management and care coordination needs related to  DMII and CKD Stage 3B as evidenced by adherence to CM Team Scheduled appointments work with Child psychotherapist to address  related to the management of Transportation and Limited access to food related to the management of DMII and CKD Stage 3B as evidenced by review of EMR and patient or Child psychotherapist report work with Publishing rights manager care guide to address needs related to  Limited access to food as evidenced by patient and/or community resource care guide  support through collaboration with Medical illustrator, provider, and care team.   Interventions: Evaluation of current treatment plan related to  self management and patient's adherence to plan as established by provider    Chronic Kidney Disease Interventions:  (Status:  Condition stable.  Not addressed this visit.) Long Term Goal Assessed the Patient understanding of chronic kidney disease    Evaluation of current treatment plan related to chronic kidney disease self management and patient's adherence to plan as established by provider. Denies any new concerns related to CKD.      Provided education to patient re: stroke prevention, s/s of heart attack and stroke. Education and support provided.    Reviewed prescribed diet ADA/Heart Healthy. Reports that he feels as if he is losing weight but denies having a decrease in appetite. He plans to discuss with nephrology on 05-29-23 Reviewed medications with patient and discussed importance of compliance. Reports compliance with all medications.    Counseled on adverse effects of illicit drug and excessive alcohol use in patients with chronic kidney disease    Counseled on the importance of exercise goals with target of 150 minutes per week     Advised patient, providing education and rationale, to monitor blood pressure daily and record, calling PCP for findings outside established parameters. Patient states that he needs a new BP machine, RNCM advised patient reach out to his health plan to see if they have any allocations for him to obtain onel Discussed complications of poorly controlled blood pressure such as heart disease, stroke, circulatory complications, vision complications, kidney impairment, sexual dysfunction  Reviewed scheduled/upcoming provider appointments including. Patient reports he has a nephrology follow up 05-29-2023 at [facility]  Advised patient to discuss any acute changes with provider    Discussed plans with patient for  ongoing care management follow up and provided patient with direct contact information for care management team    Screening for signs and symptoms of depression related to chronic disease state      Discussed the impact of chronic kidney disease on daily life and mental health and acknowledged and normalized feelings of disempowerment, fear, and frustration    Assessed social determinant of health barriers    Provided education on kidney disease progression    Last practice recorded BP readings:  BP Readings from Last 3 Encounters:  02/14/23 132/72  08/12/22 139/64  05/16/22 138/78   Most recent eGFR/CrCl:  Lab Results  Component Value Date   EGFR 39 (L) 02/14/2023    No components found for: "CRCL"    Diabetes Interventions:  (Status:  Goal on track:  Yes.) Long Term Goal Assessed patient's understanding of A1c goal: <8% Provided education to patient about basic DM disease process. A1C remains above goal at this time. Checks blood sugar as advised. Reports fasting glucose this morning of 73, lowest: 73 highest: 106. Reports no acute changes with his DM at this time. Reviewed medications with patient and discussed importance of medication adherence. Reports compliance with all medications. Counseled on importance of regular laboratory monitoring as prescribed Discussed plans with patient for ongoing care management follow up and provided patient with direct contact information for care management team Provided patient with written educational materials related to hypo and hyperglycemia and importance of correct treatment Reviewed scheduled/upcoming provider appointments including: 05-01-2023 AWV Advised patient, providing education and rationale, to check cbg as recommended and record, calling provider for findings outside established parameters. RNCM reviwed with patient fasting goal <130 and post prandial <180, patient verbalized full understanding. Referral made to social work team for  assistance with resources for food and transportation Referral made to community resources care guide team for assistance with food bank resources Review of patient status, including review of consultants reports, relevant laboratory and other test results, and medications completed Screening for signs and symptoms of depression related to chronic disease state  Assessed social determinant of health barriers Will need schedule for 2025 Lab Results  Component Value Date   HGBA1C 8.2 (A) 02/14/2023    Patient Goals/Self-Care Activities: Take all medications as prescribed Attend all scheduled provider appointments Call pharmacy for medication refills 3-7 days in advance of running out of medications Attend church or other social activities Perform all self care activities independently  Perform IADL's (shopping, preparing meals, housekeeping, managing finances) independently Call provider office for new concerns or questions  Work with the social worker to address care coordination needs and will continue to work with the clinical team to address health care and disease management related needs schedule appointment with eye doctor check feet daily for cuts, sores or redness enter blood sugar readings and medication or insulin into daily log drink 6 to 8 glasses of water each day fill half of plate with vegetables keep feet up while sitting wash and dry feet carefully every day wear comfortable, cotton socks wear comfortable, well-fitting shoes  Follow Up Plan:  Telephone follow up appointment with care management team member scheduled for:  05-25-2023 at 11:45 am           Consent to Services:  Patient was given  information about care management services, agreed to services, and gave verbal consent to participate.   Plan: Telephone follow up appointment with care management team member scheduled for:05-25-2023 at 11:45 am  Larey Brick, BSN RN [facility], [facility] RN Care Manager Direct Dial: [phone]  Fax: [phone]

## 2023-04-27 NOTE — Patient Instructions (Signed)
 Visit Information  Thank you for taking time to visit with me today. Please don't hesitate to contact me if I can be of assistance to you before our next scheduled telephone appointment.  Following are the goals we discussed today:   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  Care Coordination needs related to Transportation and Limited access to food Chronic Disease Management support and education needs related to DMII and CKD Stage 3B  Financial Constraints  Transportation barriers  RNCM Clinical Goal(s):  Patient will verbalize basic understanding of  DMII and CKD Stage 3B disease process and self health management plan as evidenced by verbal explanation, recognizing symptoms, lifestyle modifications attend all scheduled medical appointments: with primary care provider and specialist as evidenced by keeping all scheduled appointments demonstrate Improved and Ongoing adherence to prescribed treatment plan for DMII and CKD Stage 3B as evidenced by consistent medication compliance, symptom monitoring, continued lifestyle changes continue to work with RN Care Manager to address care management and care coordination needs related to  DMII and CKD Stage 3B as evidenced by adherence to CM Team Scheduled appointments work with Child psychotherapist to address  related to the management of Transportation and Limited access to food related to the management of DMII and CKD Stage 3B as evidenced by review of EMR and patient or Child psychotherapist report work with Publishing rights manager care guide to address needs related to  Limited access to food as evidenced by patient and/or community resource care guide support through collaboration with Medical illustrator, provider, and care team.    Interventions: Evaluation of current treatment plan related to  self management and patient's adherence to plan as established by provider    Chronic Kidney Disease Interventions:  (Status:  Condition stable.  Not addressed this visit.) Long Term Goal Assessed the Patient understanding of chronic kidney disease    Evaluation of current treatment plan related to chronic kidney disease self management and patient's adherence to plan as established by provider. Denies any new concerns related to CKD.      Provided education to patient re: stroke prevention, s/s of heart attack and stroke. Education and support provided.    Reviewed prescribed diet ADA/Heart Healthy. Reports that he feels as if he is losing weight but denies having a decrease in appetite. He plans to discuss with nephrology on 05-29-23 Reviewed medications with patient and discussed importance of compliance. Reports compliance with all medications.    Counseled on adverse effects of illicit drug and excessive alcohol use in patients with chronic kidney disease    Counseled on the importance of exercise goals with target of 150 minutes per week     Advised patient, providing education and rationale, to monitor blood pressure daily and record, calling PCP for findings outside established parameters. Patient states that he needs a new BP machine, RNCM advised patient reach out to his health plan to see if they have any allocations for him to obtain onel Discussed complications of poorly controlled blood pressure such as heart disease, stroke, circulatory complications, vision complications, kidney impairment, sexual dysfunction    Reviewed scheduled/upcoming provider appointments including. Patient reports he has a nephrology follow up 05-29-2023 at [facility]  Advised patient to discuss any acute changes with provider    Discussed plans with patient for ongoing care management follow up and provided patient with direct contact  information for care management team    Screening for signs and symptoms of depression related to chronic disease state      Discussed the impact of chronic kidney disease on daily life and mental health and acknowledged and normalized feelings of disempowerment, fear, and frustration    Assessed social determinant of health barriers    Provided education on kidney disease progression    Last practice recorded BP readings:  BP Readings from Last 3 Encounters:  02/14/23 132/72  08/12/22 139/64  05/16/22 138/78   Most recent eGFR/CrCl:  Lab Results  Component Value Date   EGFR 39 (L) 02/14/2023    No components found for: "CRCL"    Diabetes Interventions:  (Status:  Goal on track:  Yes.) Long Term Goal Assessed patient's understanding of A1c goal: <8% Provided education to patient about basic DM disease process. A1C remains above goal at this time. Checks blood sugar as advised. Reports fasting glucose this morning of 73, lowest: 73 highest: 106. Reports no acute changes with his DM at this time. Reviewed medications with patient and discussed importance of medication adherence. Reports compliance with all medications. Counseled on importance of regular laboratory monitoring as prescribed Discussed plans with patient for ongoing care management follow up and provided patient with direct contact information for care management team Provided patient with written educational materials related to hypo and hyperglycemia and importance of correct treatment Reviewed scheduled/upcoming provider appointments including: 05-01-2023 AWV Advised patient, providing education and rationale, to check cbg as recommended and record, calling provider for findings outside established parameters. RNCM reviwed with patient fasting goal <130 and post prandial <180, patient verbalized full understanding. Referral made to social work team for assistance with resources for food and transportation Referral made to  community resources care guide team for assistance with food bank resources Review of patient status, including review of consultants reports, relevant laboratory and other test results, and medications completed Screening for signs and symptoms of depression related to chronic disease state  Assessed social determinant of health barriers Will need schedule for 2025 Lab Results  Component Value Date   HGBA1C 8.2 (A) 02/14/2023    Patient Goals/Self-Care Activities: Take all medications as prescribed Attend all scheduled provider appointments Call pharmacy for medication refills 3-7 days in advance of running out of medications Attend church or other social activities Perform all self care activities independently  Perform IADL's (shopping, preparing meals, housekeeping, managing finances) independently Call provider office for new concerns or questions  Work with the social worker to address care coordination needs and will continue to work with the clinical team to address health care and disease management related needs schedule appointment with eye doctor check feet daily for cuts, sores or redness enter blood sugar readings and medication or insulin into daily log drink 6 to 8 glasses of water each day fill half of plate with vegetables keep feet up while sitting wash and dry feet carefully every day wear comfortable, cotton socks wear comfortable, well-fitting shoes  Follow Up Plan:  Telephone follow up appointment with care management team member scheduled for:  05-25-2023 at 11:45 am           Our next appointment is by telephone on 05-25-2023 at 11:45 am  Please call the care guide team at [phone] if you need to cancel  or reschedule your appointment.   If you are experiencing a Mental Health or Behavioral Health Crisis or need someone to talk to, please call the Suicide and Crisis Lifeline: 988 call the [place] National Suicide Prevention Lifeline: 714-288-3309 or  TTY: (312)048-8721 TTY (774)445-6134) to talk to a trained counselor call 1-800-273-TALK (toll free, 24 hour hotline) go to [facility] [address], [place] [phone])   The patient verbalized understanding of instructions, educational materials, and care plan provided today and DECLINED offer to receive copy of patient instructions, educational materials, and care plan.   Telephone follow up appointment with care management team member scheduled for:05-25-2023 at 11:45 am  Larey Brick, BSN RN [facility], [facility] RN Care Manager Direct Dial: [phone]  Fax: [phone]

## 2023-05-01 ENCOUNTER — Ambulatory Visit: Payer: 59

## 2023-05-01 VITALS — Ht 65.0 in | Wt 127.0 lb

## 2023-05-01 DIAGNOSIS — Z0000 Encounter for general adult medical examination without abnormal findings: Secondary | ICD-10-CM | POA: Diagnosis not present

## 2023-05-01 NOTE — Patient Instructions (Addendum)
 Jordan Rodriguez , Thank you for taking time to come for your Medicare Wellness Visit. I appreciate your ongoing commitment to your health goals. Please review the following plan we discussed and let me know if I can assist you in the future.   Referrals/Orders/Follow-Ups/Clinician Recommendations: Yes; Keep maintaining your health by keeping your appointments with Dr. Ardyth Harps and any specialists that you may see.  Call us if you need anything.  Have a great year!!!!  This is a list of the screening recommended for you and due dates:  Health Maintenance  Topic Date Due   Zoster (Shingles) Vaccine (1 of 2) Never done   COVID-19 Vaccine (3 - 2024-25 season) 10/30/2022   Hemoglobin A1C  08/15/2023   Colon Cancer Screening  10/15/2023   Eye exam for diabetics  10/31/2023   Yearly kidney function blood test for diabetes  02/14/2024   Yearly kidney health urinalysis for diabetes  02/14/2024   Complete foot exam   02/14/2024   Medicare Annual Wellness Visit  04/30/2024   DTaP/Tdap/Td vaccine (2 - Td or Tdap) 10/20/2029   Pneumonia Vaccine  Completed   Flu Shot  Completed   Hepatitis C Screening  Completed   HPV Vaccine  Aged Out    Advanced directives: (Declined) Advance directive discussed with you today. Even though you declined this today, please call our office should you change your mind, and we can give you the proper paperwork for you to fill out.  Next Medicare Annual Wellness Visit scheduled for next year: Yes

## 2023-05-01 NOTE — Progress Notes (Signed)
 Subjective:   Jordan Rodriguez is a 73 y.o. who presents for a Medicare Wellness preventive visit.  Visit Complete: Virtual I connected with  Constance Goltz on 05/01/23 by a audio enabled telemedicine application and verified that I am speaking with the correct person using two identifiers.  Patient Location: Home  Provider Location: Office/Clinic  I discussed the limitations of evaluation and management by telemedicine. The patient expressed understanding and agreed to proceed.  Vital Signs: Because this visit was a virtual/telehealth visit, some criteria may be missing or patient reported. Any vitals not documented were not able to be obtained and vitals that have been documented are patient reported.  VideoDeclined- This patient declined Librarian, academic. Therefore the visit was completed with audio only.  AWV Questionnaire: No: Patient Medicare AWV questionnaire was not completed prior to this visit.  Cardiac Risk Factors include: advanced age (>43men, >6 women);diabetes mellitus;dyslipidemia;family history of premature cardiovascular disease;hypertension;male gender     Objective:    Today's Vitals   05/01/23 1043  Weight: 127 lb (57.6 kg)  Height: 5\' 5"  (1.651 m)  PainSc: 0-No pain   Body mass index is 21.13 kg/m.     05/01/2023   10:45 AM 02/14/2023    1:39 PM 08/12/2022   11:50 AM 05/16/2022    1:37 PM 04/12/2022   10:51 AM 04/08/2022    1:23 PM 02/10/2022    1:56 PM  Advanced Directives  Does Patient Have a Medical Advance Directive? No No No No No No No  Would patient like information on creating a medical advance directive? No - Patient declined No - Patient declined  No - Patient declined No - Patient declined  No - Patient declined    Current Medications (verified) Outpatient Encounter Medications as of 05/01/2023  Medication Sig   ACCU-CHEK GUIDE TEST test strip USE TO TEST BLOOD SUGAR 3 TIMES PER DAY   Accu-Chek Softclix  Lancets lancets SMARTSIG:1 Topical 4 Times Daily   Accu-Chek Softclix Lancets lancets Use as instructed   aspirin EC 81 MG tablet Take 1 tablet (81 mg total) by mouth daily.   atorvastatin (LIPITOR) 40 MG tablet Take 1 tablet (40 mg total) by mouth daily.   blood glucose meter kit and supplies KIT Dispense based on patient and insurance preference. Use up to four times daily as directed. (FOR ICD-9 250.00, 250.01).   empagliflozin (JARDIANCE) 25 MG TABS tablet Take 1 tablet (25 mg total) by mouth daily.   Insulin Pen Needle (BD PEN NEEDLE NANO 2ND GEN) 32G X 4 MM MISC USE AS DIRECTED THREE TIMES DAILY TO  CHECK  BLOOD  GLUCOSE   Lancet Devices (ACCU-CHEK SOFTCLIX) lancets Check blood glucose up to 3 times daily.   LANTUS SOLOSTAR 100 UNIT/ML Solostar Pen INJECT 10 UNITS SUBCUTANEOUSLY ONCE DAILY   losartan (COZAAR) 50 MG tablet Take 50 mg by mouth at bedtime.   metFORMIN (GLUCOPHAGE) 500 MG tablet Take 3 tablets (1,500 mg total) by mouth daily with breakfast.   sodium bicarbonate 650 MG tablet Take 650 mg by mouth 2 (two) times daily.   No facility-administered encounter medications on file as of 05/01/2023.    Allergies (verified) Patient has no known allergies.   History: Past Medical History:  Diagnosis Date   Diabetes mellitus without complication (HCC)    Substance abuse (HCC)    hx alcohol (quit 1982) and tobacco (quit 1995)   Past Surgical History:  Procedure Laterality Date   COLONOSCOPY  2015  NASAL FRACTURE SURGERY     Family History  Problem Relation Age of Onset   Diabetes Mother    Hypertension Mother    Stroke Mother    Early death Father    Alcohol abuse Father    Diabetes Father    Drug abuse Sister    Cancer Paternal Grandfather    Diabetes Sister    Kidney disease Sister    Kidney disease Sister    Thyroid disease Sister    Hypertension Sister    Colon cancer Neg Hx    Social History   Socioeconomic History   Marital status: Single    Spouse name:  Not on file   Number of children: 3   Years of education: 12   Highest education level: High school graduate  Occupational History    Comment: Herbalist    Occupation: retired  Tobacco Use   Smoking status: Former    Current packs/day: 0.00    Types: Cigarettes    Quit date: 02/28/1993    Years since quitting: 30.1    Passive exposure: Past   Smokeless tobacco: Never  Vaping Use   Vaping status: Never Used  Substance and Sexual Activity   Alcohol use: No    Comment: quit 1982   Drug use: Yes    Types: Marijuana    Comment: quit 1995   Sexual activity: Not Currently  Other Topics Concern   Not on file  Social History Narrative   Patient lives alone. He does drive himself. Patient is retired.   He does have close relationships with his sister, who he drives around to run errands and medical apts. Patient is close with his children, he sees them often. However, one of his sons is in prison.    Patient enjoys playing cards and bingo.    Social Drivers of Health   Financial Resource Strain: Medium Risk (05/01/2023)   Overall Financial Resource Strain (CARDIA)    Difficulty of Paying Living Expenses: Somewhat hard  Food Insecurity: Food Insecurity Present (05/01/2023)   Hunger Vital Sign    Worried About Running Out of Food in the Last Year: Sometimes true    Ran Out of Food in the Last Year: Sometimes true  Transportation Needs: No Transportation Needs (05/01/2023)   PRAPARE - Administrator, Civil Service (Medical): No    Lack of Transportation (Non-Medical): No  Recent Concern: Transportation Needs - Unmet Transportation Needs (03/27/2023)   PRAPARE - Administrator, Civil Service (Medical): Yes    Lack of Transportation (Non-Medical): No  Physical Activity: Sufficiently Active (05/01/2023)   Exercise Vital Sign    Days of Exercise per Week: 5 days    Minutes of Exercise per Session: 30 min  Recent Concern: Physical Activity - Inactive (03/27/2023)    Exercise Vital Sign    Days of Exercise per Week: 0 days    Minutes of Exercise per Session: 0 min  Stress: No Stress Concern Present (05/01/2023)   [employer] of Occupational Health - Occupational Stress Questionnaire    Feeling of Stress : Not at all  Social Connections: Moderately Isolated (05/01/2023)   Social Connection and Isolation Panel [NHANES]    Frequency of Communication with Friends and Family: Twice a week    Frequency of Social Gatherings with Friends and Family: Twice a week    Attends Religious Services: 1 to 4 times per year    Active Member of Clubs or Organizations: No  Attends Banker Meetings: Never    Marital Status: Never married    Tobacco Counseling Counseling given: Not Answered    Clinical Intake:  Pre-visit preparation completed: Yes  Pain : No/denies pain Pain Score: 0-No pain     BMI - recorded: 21.13 Nutritional Status: BMI of 19-24  Normal Nutritional Risks: None Diabetes: Yes CBG done?: Yes (98) CBG resulted in Enter/ Edit results?: No Did pt. bring in CBG monitor from home?: No  How often do you need to have someone help you when you read instructions, pamphlets, or other written materials from your doctor or pharmacy?: 1 - Never What is the last grade level you completed in school?: Some College  Interpreter Needed?: No  Information entered by :: Bennett Ram N. Dion Sibal, LPN.   Activities of Daily Living     05/01/2023   10:48 AM  In your present state of health, do you have any difficulty performing the following activities:  Hearing? 0  Vision? 0  Difficulty concentrating or making decisions? 0  Walking or climbing stairs? 0  Dressing or bathing? 0  Doing errands, shopping? 0  Preparing Food and eating ? N  Using the Toilet? N  In the past six months, have you accidently leaked urine? N  Do you have problems with loss of bowel control? N  Managing your Medications? N  Managing your Finances? N   Housekeeping or managing your Housekeeping? N    Patient Care Team: Elberta Fortis, MD as PCP - General (Family Medicine) Ricky Stabs, RN as [facility] Care Management (General Practice) Remigio Eisenmenger D as Social Worker Illene Labrador, OD as Consulting Physician (Optometry)  Indicate any recent Medical Services you may have received from other than [facility] providers in the past year (date may be approximate).     Assessment:   This is a routine wellness examination for Zacharey.  Hearing/Vision screen Hearing Screening - Comments:: Denies hearing difficulties. No hearing aids.  Vision Screening - Comments:: Wears rx glasses - up to date with routine eye exams with [facility] Katrinka Blazing    Goals Addressed             This Visit's Progress    HEMOGLOBIN A1C < 7.0         Depression Screen     05/01/2023   10:49 AM 02/14/2023    1:39 PM 04/12/2022   10:42 AM 02/10/2022    1:56 PM 09/21/2021   10:48 AM 05/31/2021    4:31 PM 11/04/2020   10:18 AM  PHQ 2/9 Scores  PHQ - 2 Score 0 0 0 0 0 0 0  PHQ- 9 Score 0 0  0 0 0     Fall Risk     05/01/2023   10:46 AM 02/14/2023    1:40 PM 05/16/2022    1:37 PM 04/12/2022   10:43 AM 02/10/2022    1:55 PM  Fall Risk   Falls in the past year? 0 1 0 0 0  Number falls in past yr: 0 0  0 0  Injury with Fall? 0 0  0 0  Risk for fall due to : No Fall Risks Impaired balance/gait  No Fall Risks   Follow up Falls prevention discussed;Falls evaluation completed Falls prevention discussed  Falls evaluation completed     MEDICARE RISK AT HOME:  Medicare Risk at Home Any stairs in or around the home?: No If so, are there any without handrails?: No Home free of loose  throw rugs in walkways, pet beds, electrical cords, etc?: Yes Adequate lighting in your home to reduce risk of falls?: Yes Life alert?: No Use of a cane, walker or w/c?: No Grab bars in the bathroom?: Yes Shower chair or bench in shower?: No Elevated toilet seat or a  handicapped toilet?: Yes  TIMED UP AND GO:  Was the test performed?  No  Cognitive Function: 6CIT completed    05/01/2023   10:47 AM  MMSE - Mini Mental State Exam  Not completed: Unable to complete        05/01/2023   10:47 AM 04/12/2022   10:44 AM 11/04/2020   10:23 AM 09/03/2018   11:29 AM  6CIT Screen  What Year? 0 points 0 points 0 points 0 points  What month? 0 points 0 points 0 points 0 points  What time? 0 points 0 points 0 points 0 points  Count back from 20 0 points 0 points 0 points 0 points  Months in reverse 0 points 0 points 2 points 0 points  Repeat phrase 0 points 2 points 0 points 0 points  Total Score 0 points 2 points 2 points 0 points    Immunizations Immunization History  Administered Date(s) Administered   Fluad Quad(high Dose 65+) 12/11/2019, 02/08/2021, 02/10/2022   Fluad Trivalent(High Dose 65+) 02/14/2023   Influenza,inj,Quad PF,6+ Mos 02/17/2015, 11/27/2017, 11/13/2018   Janssen (J&J) SARS-COV-2 Vaccination 09/12/2019   Moderna Sars-Covid-2 Vaccination 03/28/2020   PNEUMOCOCCAL CONJUGATE-20 02/14/2023   Pneumococcal Conjugate-13 11/13/2018   Tdap 10/21/2019    Screening Tests Health Maintenance  Topic Date Due   Zoster Vaccines- Shingrix (1 of 2) Never done   COVID-19 Vaccine (3 - 2024-25 season) 10/30/2022   HEMOGLOBIN A1C  08/15/2023   Colonoscopy  10/15/2023   OPHTHALMOLOGY EXAM  10/31/2023   Diabetic kidney evaluation - eGFR measurement  02/14/2024   Diabetic kidney evaluation - Urine ACR  02/14/2024   FOOT EXAM  02/14/2024   Medicare Annual Wellness (AWV)  04/30/2024   DTaP/Tdap/Td (2 - Td or Tdap) 10/20/2029   Pneumonia Vaccine 105+ Years old  Completed   INFLUENZA VACCINE  Completed   Hepatitis C Screening  Completed   HPV VACCINES  Aged Out    Health Maintenance  Health Maintenance Due  Topic Date Due   Zoster Vaccines- Shingrix (1 of 2) Never done   COVID-19 Vaccine (3 - 2024-25 season) 10/30/2022   Health Maintenance  Items Addressed: Yes; Patient advised that he is overdue for Covid-19 and Shingrix Vaccines.  Additional Screening:  Vision Screening: Recommended annual ophthalmology exams for early detection of glaucoma and other disorders of the eye.  Dental Screening: Recommended annual dental exams for proper oral hygiene  Community Resource Referral / Chronic Care Management: CRR required this visit?  No   CCM required this visit?  No     Plan:     I have personally reviewed and noted the following in the patient's chart:   Medical and social history Use of alcohol, tobacco or illicit drugs  Current medications and supplements including opioid prescriptions. Patient is not currently taking opioid prescriptions. Functional ability and status Nutritional status Physical activity Advanced directives List of other physicians Hospitalizations, surgeries, and ER visits in previous 12 months Vitals Screenings to include cognitive, depression, and falls Referrals and appointments  In addition, I have reviewed and discussed with patient certain preventive protocols, quality metrics, and best practice recommendations. A written personalized care plan for preventive services as well as  general preventive health recommendations were provided to patient.     Mickeal Needy, LPN   4/0/9811   After Visit Summary: (Declined) Due to this being a telephonic visit, with patients personalized plan was offered to patient but patient Declined AVS at this time   Notes: Nothing significant to report at this time.

## 2023-05-03 ENCOUNTER — Encounter: Payer: 59 | Admitting: Licensed Clinical Social Worker

## 2023-05-04 ENCOUNTER — Encounter: Payer: 59 | Admitting: Licensed Clinical Social Worker

## 2023-05-14 ENCOUNTER — Other Ambulatory Visit: Payer: Self-pay | Admitting: Family Medicine

## 2023-05-14 DIAGNOSIS — E1165 Type 2 diabetes mellitus with hyperglycemia: Secondary | ICD-10-CM

## 2023-05-16 ENCOUNTER — Ambulatory Visit: Payer: Self-pay | Admitting: Licensed Clinical Social Worker

## 2023-05-16 NOTE — Patient Outreach (Signed)
 Care Coordination   Follow Up Visit Note   05/16/2023 Name: Jordan Rodriguez MRN: [id number] DOB: 17-Jul-1950  Jordan Rodriguez is a 73 y.o. year old male who sees Elberta Fortis, MD for primary care. I spoke with  Constance Goltz by phone today.  What matters to the patients health and wellness today?  Food Insecurities and utility assistance    Goals Addressed             This Visit's Progress    COMPLETED: Care Coordination Activities       Care Coordination Interventions: Patient stated that he doe shave times that his food does run low, he does receive $147 in food stamps. Patient stated that he is ok on food currently an that his daughter does make sure that he has enoug. Patient stated that someone was suppose to send him a list of food pantries but he never received I. Sw will mail out the food pantry list.  Patient stated that as for as transportation that his daughter takes him to his appointments and to other places that he need to go too. SW discussed the SCAT/[place] Access and the patient stated he is ok with his daughter taking him places and has better trust with her currently but stated that if changes his mind he will let the SW know if he wants to apply for SCAT. Patient stated that he does have some financial strain but is currently is working on pay some of his bill down and has a plan.  SW will follow up on 05/03/2023 at 2:00 pm        SDOH assessments and interventions completed:  Yes  SDOH Interventions Today    Flowsheet Row Most Recent Value  SDOH Interventions   Food Insecurity Interventions Intervention Not Indicated  Housing Interventions Intervention Not Indicated  Transportation Interventions Intervention Not Indicated  Utilities Interventions Intervention Not Indicated        Care Coordination Interventions:  Yes, provided  Interventions Today    Flowsheet Row Most Recent Value  General Interventions   General Interventions  Discussed/Reviewed General Interventions Reviewed, Science writer received all resources for food and utilities]        Follow up plan: No further intervention required.   Encounter Outcome:  Patient Visit Completed   Jeanie Cooks, PhD [facility] [place], [facility] Social Worker Direct Dial: [phone]  Fax: [phone]

## 2023-05-16 NOTE — Patient Instructions (Signed)
 Visit Information  Thank you for taking time to visit with me today. Please don't hesitate to contact me if I can be of assistance to you.   Following are the goals we discussed today:   Goals Addressed             This Visit's Progress    COMPLETED: Care Coordination Activities       Care Coordination Interventions: Patient stated that he doe shave times that his food does run low, he does receive $147 in food stamps. Patient stated that he is ok on food currently an that his daughter does make sure that he has enoug. Patient stated that someone was suppose to send him a list of food pantries but he never received I. Sw will mail out the food pantry list.  Patient stated that as for as transportation that his daughter takes him to his appointments and to other places that he need to go too. SW discussed the SCAT/[place] Access and the patient stated he is ok with his daughter taking him places and has better trust with her currently but stated that if changes his mind he will let the SW know if he wants to apply for SCAT. Patient stated that he does have some financial strain but is currently is working on pay some of his bill down and has a plan.  SW will follow up on 05/03/2023 at 2:00 pm        No further follow up needed, SW encouraged  patient to contact the PCP if any SDOH needs arise.   Please call the care guide team at [phone] if you need to cancel or reschedule your appointment.   If you are experiencing a Mental Health or Behavioral Health Crisis or need someone to talk to, please call the Suicide and Crisis Lifeline: 988 go to [facility] [address], [place] [phone]) call 911  The patient verbalized understanding of instructions, educational materials, and care plan provided today and DECLINED offer to receive copy of patient instructions, educational materials, and care plan.   Jeanie Cooks, PhD [facility], [facility] Social Worker Direct Dial: [phone]  Fax: [phone]

## 2023-05-17 DIAGNOSIS — E1122 Type 2 diabetes mellitus with diabetic chronic kidney disease: Secondary | ICD-10-CM | POA: Diagnosis not present

## 2023-05-17 DIAGNOSIS — E875 Hyperkalemia: Secondary | ICD-10-CM | POA: Diagnosis not present

## 2023-05-17 DIAGNOSIS — N2581 Secondary hyperparathyroidism of renal origin: Secondary | ICD-10-CM | POA: Diagnosis not present

## 2023-05-17 DIAGNOSIS — D631 Anemia in chronic kidney disease: Secondary | ICD-10-CM | POA: Diagnosis not present

## 2023-05-17 DIAGNOSIS — N1832 Chronic kidney disease, stage 3b: Secondary | ICD-10-CM | POA: Diagnosis not present

## 2023-05-17 DIAGNOSIS — I10 Essential (primary) hypertension: Secondary | ICD-10-CM | POA: Diagnosis not present

## 2023-05-17 DIAGNOSIS — E872 Acidosis, unspecified: Secondary | ICD-10-CM | POA: Diagnosis not present

## 2023-05-17 DIAGNOSIS — I120 Hypertensive chronic kidney disease with stage 5 chronic kidney disease or end stage renal disease: Secondary | ICD-10-CM | POA: Diagnosis not present

## 2023-05-17 DIAGNOSIS — N189 Chronic kidney disease, unspecified: Secondary | ICD-10-CM | POA: Diagnosis not present

## 2023-05-25 ENCOUNTER — Other Ambulatory Visit: Payer: Self-pay | Admitting: *Deleted

## 2023-05-25 NOTE — Patient Outreach (Signed)
 Care Management   Visit Note  05/25/2023 Name: Jordan Rodriguez MRN: [id number] DOB: 1950/12/01  Subjective: Jordan Rodriguez is a 73 y.o. year old male who is a primary care patient of Elberta Fortis, MD. The Care Management team was consulted for assistance.      Engaged with patient spoke with patient by telephone.    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  Care Coordination needs related to Transportation and Limited access to food Chronic Disease Management support and education needs related to DMII and CKD Stage 3B  Financial Constraints  Transportation barriers  RNCM Clinical Goal(s):  Patient will verbalize basic understanding of  DMII and CKD Stage 3B disease process and self health management plan as evidenced by verbal explanation, recognizing symptoms, lifestyle modifications attend all scheduled medical appointments: with primary care provider and specialist as evidenced by keeping all scheduled appointments demonstrate Improved and Ongoing adherence to prescribed treatment plan for DMII and CKD Stage 3B as evidenced by consistent medication compliance, symptom monitoring, continued lifestyle changes continue to work with RN Care Manager to address care management and care coordination needs related to  DMII and CKD Stage 3B as evidenced by adherence to CM Team Scheduled appointments work with Child psychotherapist to address  related to the management of Transportation and Limited access to food related to the management of DMII and CKD Stage 3B as evidenced by review of EMR and patient or Child psychotherapist report work with Publishing rights manager care guide to address needs related to  Limited access to food as evidenced by patient and/or community resource care guide  support through collaboration with Medical illustrator, provider, and care team.   Interventions: Evaluation of current treatment plan related to  self management and patient's adherence to plan as established by provider    Chronic Kidney Disease Interventions:  (Status:  Condition stable.  Not addressed this visit.) Long Term Goal Assessed the Patient understanding of chronic kidney disease    Evaluation of current treatment plan related to chronic kidney disease self management and patient's adherence to plan as established by provider. Reports that he was started on [facility] 3x/weekly. Denies any new concerns related to CKD.      Provided education to patient re: stroke prevention, s/s of heart attack and stroke. Education and support provided.    Reviewed prescribed diet ADA/Heart Healthy. Reports compliance. Reviewed medications with patient and discussed importance of compliance. Reports compliance with all medications.    Counseled on adverse effects of illicit drug and excessive alcohol use in patients with chronic kidney disease. Denies any illicit drug or excessive alcohol    Counseled on the importance of exercise goals with target of 150 minutes per week     Advised patient, providing education and rationale, to monitor blood pressure daily and record, calling PCP for findings outside established parameters. Patient states that he needs a new BP machine, RNCM provided reminder to patient reach out to his health plan to see if they have any allocations for him to obtain one Discussed complications of poorly controlled blood pressure such as heart disease, stroke, circulatory complications, vision complications, kidney impairment, sexual dysfunction    Reviewed scheduled/upcoming provider appointments including.  Patient reports he has a nephrology follow up in 6 months at [facility]  Advised patient to discuss any acute changes with provider    Discussed plans with patient for ongoing  care management follow up and provided patient with direct contact information for care management team    Screening for signs and symptoms of depression related to chronic disease state      Discussed the impact of chronic kidney disease on daily life and mental health and acknowledged and normalized feelings of disempowerment, fear, and frustration    Assessed social determinant of health barriers    Provided education on kidney disease progression    Last practice recorded BP readings:  BP Readings from Last 3 Encounters:  02/14/23 132/72  08/12/22 139/64  05/16/22 138/78   Most recent eGFR/CrCl:  Lab Results  Component Value Date   EGFR 39 (L) 02/14/2023    No components found for: "CRCL"    Diabetes Interventions:  (Status:  Goal on track:  Yes.) Long Term Goal Assessed patient's understanding of A1c goal: <8% Provided education to patient about basic DM disease process. A1C remains above goal at this time. Checks blood sugar as advised. Reports fasting glucose this morning of 119, lowest: 95 highest: 257. Reports no acute changes with his DM at this time. Reviewed medications with patient and discussed importance of medication adherence. Reports compliance with all medications. Counseled on importance of regular laboratory monitoring as prescribed. Needs updated labs. Discussed plans with patient for ongoing care management follow up and provided patient with direct contact information for care management team Provided patient with written educational materials related to hypo and hyperglycemia and importance of correct treatment Reviewed scheduled/upcoming provider appointments including: No follow up scheduled Advised patient, providing education and rationale, to check cbg as recommended and record, calling provider for findings outside established parameters. RNCM reviwed with patient fasting goal <130 and post prandial <180, patient verbalized full understanding. Referral made  to social work team for assistance with resources for food and transportation Referral made to community resources care guide team for assistance with food bank resources Review of patient status, including review of consultants reports, relevant laboratory and other test results, and medications completed Screening for signs and symptoms of depression related to chronic disease state  Assessed social determinant of health barriers Will need eye exam schedule for 2025 Lab Results  Component Value Date   HGBA1C 8.2 (A) 02/14/2023    Patient Goals/Self-Care Activities: Take all medications as prescribed Attend all scheduled provider appointments Call pharmacy for medication refills 3-7 days in advance of running out of medications Attend church or other social activities Perform all self care activities independently  Perform IADL's (shopping, preparing meals, housekeeping, managing finances) independently Call provider office for new concerns or questions  Work with the social worker to address care coordination needs and will continue to work with the clinical team to address health care and disease management related needs schedule appointment with eye doctor check feet daily for cuts, sores or redness enter blood sugar readings and medication or insulin into daily log drink 6 to 8 glasses of water each day fill half of plate with vegetables keep feet up while sitting wash and dry feet carefully every day wear comfortable, cotton socks wear comfortable, well-fitting shoes  Follow Up Plan:  Telephone follow up appointment with care management team member scheduled for:  06-29-2023 at 10:30 am           Consent to Services:  Patient was given information about care management services, agreed to services, and gave verbal consent to participate.   Plan: Telephone follow up appointment with care management team member scheduled for:06-29-2023 at 10:30 am  Larey Brick, BSN RN [facility], [facility] Health RN Care Manager Direct Dial: [phone]  Fax: [phone]

## 2023-05-25 NOTE — Patient Instructions (Signed)
 Visit Information  Thank you for taking time to visit with me today. Please don't hesitate to contact me if I can be of assistance to you before our next scheduled telephone appointment.  Following are the goals we discussed today:   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  Care Coordination needs related to Transportation and Limited access to food Chronic Disease Management support and education needs related to DMII and CKD Stage 3B  Financial Constraints  Transportation barriers  RNCM Clinical Goal(s):  Patient will verbalize basic understanding of  DMII and CKD Stage 3B disease process and self health management plan as evidenced by verbal explanation, recognizing symptoms, lifestyle modifications attend all scheduled medical appointments: with primary care provider and specialist as evidenced by keeping all scheduled appointments demonstrate Improved and Ongoing adherence to prescribed treatment plan for DMII and CKD Stage 3B as evidenced by consistent medication compliance, symptom monitoring, continued lifestyle changes continue to work with RN Care Manager to address care management and care coordination needs related to  DMII and CKD Stage 3B as evidenced by adherence to CM Team Scheduled appointments work with Child psychotherapist to address  related to the management of Transportation and Limited access to food related to the management of DMII and CKD Stage 3B as evidenced by review of EMR and patient or Child psychotherapist report work with Publishing rights manager care guide to address needs related to  Limited access to food as evidenced by patient and/or community resource care guide support through collaboration with Medical illustrator, provider, and care team.    Interventions: Evaluation of current treatment plan related to  self management and patient's adherence to plan as established by provider    Chronic Kidney Disease Interventions:  (Status:  Condition stable.  Not addressed this visit.) Long Term Goal Assessed the Patient understanding of chronic kidney disease    Evaluation of current treatment plan related to chronic kidney disease self management and patient's adherence to plan as established by provider. Reports that he was started on [facility] 3x/weekly. Denies any new concerns related to CKD.      Provided education to patient re: stroke prevention, s/s of heart attack and stroke. Education and support provided.    Reviewed prescribed diet ADA/Heart Healthy. Reports compliance. Reviewed medications with patient and discussed importance of compliance. Reports compliance with all medications.    Counseled on adverse effects of illicit drug and excessive alcohol use in patients with chronic kidney disease. Denies any illicit drug or excessive alcohol    Counseled on the importance of exercise goals with target of 150 minutes per week     Advised patient, providing education and rationale, to monitor blood pressure daily and record, calling PCP for findings outside established parameters. Patient states that he needs a new BP machine, RNCM provided reminder to patient reach out to his health plan to see if they have any allocations for him to obtain one Discussed complications of poorly controlled blood pressure such as heart disease, stroke, circulatory complications, vision complications, kidney impairment, sexual dysfunction    Reviewed scheduled/upcoming provider appointments including. Patient reports he has a nephrology follow up in 6 months at [facility]  Advised patient to discuss  any acute changes with provider    Discussed plans with patient for ongoing care management follow up and provided patient with direct contact information  for care management team    Screening for signs and symptoms of depression related to chronic disease state      Discussed the impact of chronic kidney disease on daily life and mental health and acknowledged and normalized feelings of disempowerment, fear, and frustration    Assessed social determinant of health barriers    Provided education on kidney disease progression    Last practice recorded BP readings:  BP Readings from Last 3 Encounters:  02/14/23 132/72  08/12/22 139/64  05/16/22 138/78   Most recent eGFR/CrCl:  Lab Results  Component Value Date   EGFR 39 (L) 02/14/2023    No components found for: "CRCL"    Diabetes Interventions:  (Status:  Goal on track:  Yes.) Long Term Goal Assessed patient's understanding of A1c goal: <8% Provided education to patient about basic DM disease process. A1C remains above goal at this time. Checks blood sugar as advised. Reports fasting glucose this morning of 119, lowest: 95 highest: 257. Reports no acute changes with his DM at this time. Reviewed medications with patient and discussed importance of medication adherence. Reports compliance with all medications. Counseled on importance of regular laboratory monitoring as prescribed. Needs updated labs. Discussed plans with patient for ongoing care management follow up and provided patient with direct contact information for care management team Provided patient with written educational materials related to hypo and hyperglycemia and importance of correct treatment Reviewed scheduled/upcoming provider appointments including: No follow up scheduled Advised patient, providing education and rationale, to check cbg as recommended and record, calling provider for findings outside established parameters. RNCM reviwed with patient fasting goal <130 and post prandial <180, patient verbalized full understanding. Referral made to social work team for assistance with resources for food and  transportation Referral made to community resources care guide team for assistance with food bank resources Review of patient status, including review of consultants reports, relevant laboratory and other test results, and medications completed Screening for signs and symptoms of depression related to chronic disease state  Assessed social determinant of health barriers Will need eye exam schedule for 2025 Lab Results  Component Value Date   HGBA1C 8.2 (A) 02/14/2023    Patient Goals/Self-Care Activities: Take all medications as prescribed Attend all scheduled provider appointments Call pharmacy for medication refills 3-7 days in advance of running out of medications Attend church or other social activities Perform all self care activities independently  Perform IADL's (shopping, preparing meals, housekeeping, managing finances) independently Call provider office for new concerns or questions  Work with the social worker to address care coordination needs and will continue to work with the clinical team to address health care and disease management related needs schedule appointment with eye doctor check feet daily for cuts, sores or redness enter blood sugar readings and medication or insulin into daily log drink 6 to 8 glasses of water each day fill half of plate with vegetables keep feet up while sitting wash and dry feet carefully every day wear comfortable, cotton socks wear comfortable, well-fitting shoes  Follow Up Plan:  Telephone follow up appointment with care management team member scheduled for:  06-29-2023 at 10:30 am           Our next appointment is by telephone on 06-29-2023 at 10:30 am  Please call the care guide team at [phone] if you  need to cancel or reschedule your appointment.   If you are experiencing a Mental Health or Behavioral Health Crisis or need someone to talk to, please call the Suicide and Crisis Lifeline: 988 call the [place] National Suicide  Prevention Lifeline: (806) 313-2042 or TTY: 9590187794 TTY 2127403849) to talk to a trained counselor call 1-800-273-TALK (toll free, 24 hour hotline) go to [facility] [address] [place] [phone])   The patient verbalized understanding of instructions, educational materials, and care plan provided today and DECLINED offer to receive copy of patient instructions, educational materials, and care plan.   Telephone follow up appointment with care management team member scheduled for:06-29-2023 at 10:30 am  Larey Brick, BSN RN [facility], [facility] Health RN Care Manager Direct Dial: [phone]  Fax: [phone]

## 2023-06-29 ENCOUNTER — Ambulatory Visit: Payer: Self-pay

## 2023-06-29 NOTE — Patient Outreach (Signed)
 Complex Care Management   Visit Note  06/29/2023  Name:  Jordan Rodriguez MRN: [id number] DOB: May 21, 1950  Situation: Referral received for Complex Care Management related to Diabetes with Complications I obtained verbal consent from Patient.  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)    Assessment: Patient Reported Symptoms:  Cognitive Cognitive Status: Able to follow simple commands, Alert and oriented to person, place, and time      Neurological Neurological Review of Symptoms: No symptoms reported    HEENT HEENT Symptoms Reported: No symptoms reported      Cardiovascular Cardiovascular Symptoms Reported: No symptoms reported    Respiratory Respiratory Symptoms Reported: No symptoms reported    Endocrine Is patient diabetic?: Yes Is patient checking blood sugars at home?: Yes Endocrine Conditions: Diabetes Endocrine Management Strategies: Medication therapy Endocrine Self-Management Outcome: 4 (good)  Gastrointestinal Gastrointestinal Symptoms Reported: No symptoms reported      Genitourinary Genitourinary Symptoms Reported: No symptoms reported    Integumentary Integumentary Symptoms Reported: No symptoms reported    Musculoskeletal Musculoskelatal Symptoms Reviewed: No symptoms reported   Falls in the past year?: No    Psychosocial       Quality of Family Relationships: supportive Do you feel physically threatened by others?: No      06/29/2023   10:49 AM  Depression screen PHQ 2/9  Decreased Interest 0  Down, Depressed, Hopeless 0  PHQ - 2 Score 0    There were no vitals filed for this visit.  Medications Reviewed Today     Reviewed by Augustin Leber, RN (Registered Nurse) on 06/29/23 at 1045  Med List Status: <None>   Medication Order Taking? Sig Documenting Provider Last  Dose Status Informant  ACCU-CHEK GUIDE TEST test strip [id number] Yes USE TO TEST BLOOD SUGAR 3 TIMES PER DAY Jonne Netters, MD Taking Active   Accu-Chek Softclix Lancets lancets [id number] Yes SMARTSIG:1 Topical 4 Times Daily [provider] Taking Active   Accu-Chek Softclix Lancets lancets [id number] Yes Use as instructed Espinoza, Alejandra, DO Taking Active   aspirin  EC 81 MG tablet [id number] Yes Take 1 tablet (81 mg total) by mouth daily. Espinoza, Alejandra, DO Taking Active   atorvastatin  (LIPITOR) 40 MG tablet [id number] Yes Take 1 tablet (40 mg total) by mouth daily. Jonne Netters, MD Taking Active   blood glucose meter kit and supplies KIT [id number] Yes Dispense based on patient and insurance preference. Use up to four times daily as directed. (FOR ICD-9 250.00, 250.01). Kenneth Peace, DO Taking Active   empagliflozin  (JARDIANCE ) 25 MG TABS tablet [id number] Yes Take 1 tablet by mouth once daily Jonne Netters, MD Taking Active   Insulin  Pen Needle (BD PEN NEEDLE NANO 2ND GEN) 32G X 4 MM MISC [id number] Yes USE AS DIRECTED THREE TIMES DAILY TO  CHECK  BLOOD  GLUCOSE Espinoza, Alejandra, DO Taking Active   Lancet Devices (ACCU-CHEK [facility]) lancets [id number] Yes Check blood glucose up to 3 times daily. Rockwell Cinnamon, MD Taking Active   LANTUS  SOLOSTAR 100 UNIT/ML Solostar Pen [id number]  INJECT 10 UNITS SUBCUTANEOUSLY ONCE DAILY Jonne Netters, MD  Active   losartan (COZAAR) 50 MG tablet [id number] Yes Take 50 mg by mouth at bedtime. [provider] Taking Active  Med Note Karlton Overly   Mon Feb 08, 2021  5:06 PM) Supposed to be 50 mg now  metFORMIN  (GLUCOPHAGE ) 500 MG tablet [id number] Yes Take 3 tablets (1,500 mg total) by mouth daily with breakfast. Jonne Netters, MD Taking Active   sodium bicarbonate 650 MG tablet [id number] Yes Take 650 mg by mouth 2 (two) times daily. [provider] Taking Active Self  sodium zirconium  cyclosilicate (LOKELMA) 10 g PACK packet [id number] Yes Take 10 g by mouth 3 (three) times a week. Cristi Donalds, MD Taking Active Self            Recommendation:   PCP Follow-up  Follow Up Plan:   Telephone follow up appointment with care management team member scheduled for:  08/01/23  11 am  Augustin Leber RN, BSN, [facility]  [facility], [facility] Health  Care Coordinator Phone: [phone]

## 2023-06-29 NOTE — Patient Instructions (Signed)
 Visit Information  Thank you for taking time to visit with me today. Please don't hesitate to contact me if I can be of assistance to you before our next scheduled appointment.  Your next care management appointment is Telephone follow on  08/01/23  11 am   Please call the care guide team at [phone] if you need to cancel, schedule, or reschedule an appointment.   Please call 1-800-273-TALK (toll free, 24 hour hotline) if you are experiencing a Mental Health or Behavioral Health Crisis or need someone to talk to.   Augustin Leber RN, BSN, [facility] [place]  [facility], [facility] Health  Care Coordinator Phone: [phone]

## 2023-06-30 ENCOUNTER — Other Ambulatory Visit: Payer: Self-pay | Admitting: Family Medicine

## 2023-08-01 ENCOUNTER — Telehealth: Payer: Self-pay

## 2023-08-10 ENCOUNTER — Telehealth: Payer: Self-pay | Admitting: *Deleted

## 2023-08-10 NOTE — Progress Notes (Signed)
 Complex Care Management Care Guide Note  08/10/2023 Name: ELCHONON MAXSON MRN: [id number] DOB: 1950-03-25  CAIDYN HENRICKSEN is a 73 y.o. year old male who is a primary care patient of Jonne Netters, MD and is actively engaged with the care management team. I reached out to Nana Avers by phone today to assist with re-scheduling  with the RN Case Manager.  Follow up plan: Unsuccessful telephone outreach attempt made.   Barnie Bora  [facility] Health  Value-Based Care Institute, [facility] Guide  Direct Dial: [phone]  Fax [phone]

## 2023-08-14 NOTE — Progress Notes (Signed)
 Complex Care Management Care Guide Note  08/14/2023 Name: [name] MRN: [id number] DOB: 07-Mar-1950  Jordan Rodriguez is a 73 y.o. year old male who is a primary care patient of Jonne Netters, MD and is actively engaged with the care management team. I reached out to Nana Avers by phone today to assist with re-scheduling  with the RN Case Manager.  Follow up plan: Unsuccessful telephone outreach attempt made. No further outreach attempts will be made at this time. We have been unable to contact the patient to reschedule for complex care management services.   Barnie Bora  [facility] Health  Value-Based Care Institute, [facility] Guide  Direct Dial: [phone]  Fax [phone]

## 2023-09-11 ENCOUNTER — Ambulatory Visit: Admitting: Family Medicine

## 2023-09-11 ENCOUNTER — Encounter: Payer: Self-pay | Admitting: Family Medicine

## 2023-09-11 VITALS — BP 133/71 | HR 77 | Ht 65.0 in | Wt 124.0 lb

## 2023-09-11 DIAGNOSIS — E1165 Type 2 diabetes mellitus with hyperglycemia: Secondary | ICD-10-CM | POA: Diagnosis not present

## 2023-09-11 DIAGNOSIS — Z794 Long term (current) use of insulin: Secondary | ICD-10-CM

## 2023-09-11 DIAGNOSIS — N1832 Chronic kidney disease, stage 3b: Secondary | ICD-10-CM

## 2023-09-11 DIAGNOSIS — Z23 Encounter for immunization: Secondary | ICD-10-CM

## 2023-09-11 DIAGNOSIS — Z5941 Food insecurity: Secondary | ICD-10-CM | POA: Diagnosis not present

## 2023-09-11 LAB — POCT GLYCOSYLATED HEMOGLOBIN (HGB A1C): HbA1c, POC (controlled diabetic range): 7.4 % — AB (ref 0.0–7.0)

## 2023-09-11 MED ORDER — LANTUS SOLOSTAR 100 UNIT/ML ~~LOC~~ SOPN: 8.0000 [IU] | PEN_INJECTOR | Freq: Every day | SUBCUTANEOUS | 5 refills | Status: AC

## 2023-09-11 MED ORDER — SHINGRIX 50 MCG/0.5ML IM SUSR: INTRAMUSCULAR | 1 refills | Status: AC

## 2023-09-11 NOTE — Assessment & Plan Note (Signed)
 A1c 7.4, improved and CGM showed very well-controlled fasting between 80 and 100.  Will decrease insulin  slowly with goal to eventually come off of it. -Decrease to Lantus  8 units daily -Lipid panel, BMP, ACR

## 2023-09-11 NOTE — Patient Instructions (Addendum)
 It was wonderful to see you today! Thank you for choosing [facility].   Please bring ALL of your medications with you to every visit.   Today we talked about:  Your A1c is excellent today!  It is 7.4.  I think we can decrease your Lantus  to 8 units daily and please continue your other diabetes medications.  We are also checking some other blood work today to monitor your cholesterol and look at your kidney function potassium.  Please follow-up with the nephrologist as scheduled to continue monitoring your kidney function closely. Please also follow-up with the social work team to discuss ensure you have enough food at home as this could be why lost weight.  You can also come back here if you need more food as well as we have a food pantry that we can sometimes provide to folks.  Please follow up in 3 months   If you haven't already, sign up for My Chart to have easy access to your labs results, and communication with your primary care physician.   We are checking some labs today. If they are abnormal, I will call you. If they are normal, I will send you a MyChart message (if it is active) or a letter in the mail. If you do not hear about your labs in the next 2 weeks, please call the office.  Call the clinic at [phone] if your symptoms worsen or you have any concerns.  Please be sure to schedule follow up at the front desk before you leave today.   Izetta Nap, DO Family Medicine

## 2023-09-11 NOTE — Assessment & Plan Note (Signed)
 Follows with [facility]

## 2023-09-11 NOTE — Progress Notes (Signed)
    SUBJECTIVE:   CHIEF COMPLAINT / HPI:   Diabetes Current Regimen: Jardiance  25 mg daily, Lantus  10 units daily, metformin  1.5g daily CBGs: Fasting 80-100s, some up to 130s. Postprandial up to 200.  Last A1c:  Lab Results  Component Value Date   HGBA1C 7.4 (A) 09/11/2023    Denies polyuria, polydipsia, hypoglycemia. Last Eye Exam: UTD Statin: Atorvastatin  40 mg daily ACE/ARB: Losartan 50 mg daily Patient reports he has run out of food recently which has made eating stressful.  Reports he may have lost some weight because of this.  Reports he is trying to get established with a stooping to get regular food source.  Reports he did run out of food yesterday makes trying to get more food available today.  PERTINENT  PMH / PSH: HTN, T2DM, CKD 3, HLD, hyperkalemia  OBJECTIVE:   BP 133/71   Pulse 77   Ht 5' 5 (1.651 m)   Wt 124 lb (56.2 kg)   SpO2 99%   BMI 20.63 kg/m    General: Alert, no apparent distress, well groomed HEENT: Normocephalic, atraumatic, moist mucus membranes, neck supple Respiratory: Normal respiratory effort GI: Non-distended Skin: No rashes, no jaundice Psych: Appropriate mood and affect  ASSESSMENT/PLAN:   Assessment & Plan Type 2 diabetes mellitus with hyperglycemia, with long-term current use of insulin  (HCC) A1c 7.4, improved and CGM showed very well-controlled fasting between 80 and 100.  Will decrease insulin  slowly with goal to eventually come off of it. -Decrease to Lantus  8 units daily -Lipid panel, BMP, ACR Stage 3b chronic kidney disease (HCC) Follows with [facility] Encounter for immunization Rx provided for shingles vaccine Food insecurity Follows with [employer], try to get established with food bank.  Provided with bag of food today.  Assess for food insecurity at follow-up   Dr. Izetta Nap, DO [facility]

## 2023-09-12 LAB — BASIC METABOLIC PANEL WITH GFR
BUN/Creatinine Ratio: 17 (ref 10–24)
BUN: 35 mg/dL — ABNORMAL HIGH (ref 8–27)
CO2: 19 mmol/L — ABNORMAL LOW (ref 20–29)
Calcium: 8.6 mg/dL (ref 8.6–10.2)
Chloride: 100 mmol/L (ref 96–106)
Creatinine, Ser: 2.03 mg/dL — ABNORMAL HIGH (ref 0.76–1.27)
Glucose: 110 mg/dL — ABNORMAL HIGH (ref 70–99)
Potassium: 5.3 mmol/L — ABNORMAL HIGH (ref 3.5–5.2)
Sodium: 135 mmol/L (ref 134–144)
eGFR: 34 mL/min/1.73 — ABNORMAL LOW (ref >59)

## 2023-09-12 LAB — MICROALBUMIN / CREATININE URINE RATIO
Creatinine, Urine: 34.6 mg/dL
Microalb/Creat Ratio: 94 mg/g{creat} — ABNORMAL HIGH (ref 0–29)
Microalbumin, Urine: 32.6 ug/mL

## 2023-09-12 LAB — LIPID PANEL
Chol/HDL Ratio: 2.8 ratio (ref 0.0–5.0)
Cholesterol, Total: 82 mg/dL — ABNORMAL LOW (ref 100–199)
HDL: 29 mg/dL — ABNORMAL LOW (ref >39)
LDL Chol Calc (NIH): 37 mg/dL (ref 0–99)
Triglycerides: 74 mg/dL (ref 0–149)
VLDL Cholesterol Cal: 16 mg/dL (ref 5–40)

## 2023-09-14 ENCOUNTER — Ambulatory Visit: Payer: Self-pay | Admitting: Family Medicine

## 2023-09-23 ENCOUNTER — Other Ambulatory Visit: Payer: Self-pay | Admitting: Family Medicine

## 2023-09-23 DIAGNOSIS — E118 Type 2 diabetes mellitus with unspecified complications: Secondary | ICD-10-CM

## 2023-11-07 ENCOUNTER — Other Ambulatory Visit: Payer: Self-pay | Admitting: Family Medicine

## 2023-11-07 DIAGNOSIS — E1165 Type 2 diabetes mellitus with hyperglycemia: Secondary | ICD-10-CM

## 2023-11-14 DIAGNOSIS — E1122 Type 2 diabetes mellitus with diabetic chronic kidney disease: Secondary | ICD-10-CM | POA: Diagnosis not present

## 2023-11-14 DIAGNOSIS — N2581 Secondary hyperparathyroidism of renal origin: Secondary | ICD-10-CM | POA: Diagnosis not present

## 2023-11-14 DIAGNOSIS — E872 Acidosis, unspecified: Secondary | ICD-10-CM | POA: Diagnosis not present

## 2023-11-14 DIAGNOSIS — D631 Anemia in chronic kidney disease: Secondary | ICD-10-CM | POA: Diagnosis not present

## 2023-11-14 DIAGNOSIS — N1832 Chronic kidney disease, stage 3b: Secondary | ICD-10-CM | POA: Diagnosis not present

## 2023-11-14 DIAGNOSIS — N189 Chronic kidney disease, unspecified: Secondary | ICD-10-CM | POA: Diagnosis not present

## 2023-11-14 DIAGNOSIS — I129 Hypertensive chronic kidney disease with stage 1 through stage 4 chronic kidney disease, or unspecified chronic kidney disease: Secondary | ICD-10-CM | POA: Diagnosis not present

## 2023-11-14 DIAGNOSIS — E875 Hyperkalemia: Secondary | ICD-10-CM | POA: Diagnosis not present

## 2023-11-17 LAB — LAB REPORT - SCANNED
Albumin, Urine POC: 110.3
Creatinine, POC: 61.4 mg/dL
EGFR: 34
Microalb Creat Ratio: 180

## 2023-12-11 ENCOUNTER — Ambulatory Visit: Admitting: Family Medicine

## 2023-12-11 ENCOUNTER — Encounter: Payer: Self-pay | Admitting: Family Medicine

## 2023-12-11 VITALS — BP 128/66 | HR 75 | Wt 122.0 lb

## 2023-12-11 DIAGNOSIS — E1122 Type 2 diabetes mellitus with diabetic chronic kidney disease: Secondary | ICD-10-CM

## 2023-12-11 DIAGNOSIS — Z794 Long term (current) use of insulin: Secondary | ICD-10-CM

## 2023-12-11 DIAGNOSIS — Z1211 Encounter for screening for malignant neoplasm of colon: Secondary | ICD-10-CM | POA: Diagnosis not present

## 2023-12-11 DIAGNOSIS — N1832 Chronic kidney disease, stage 3b: Secondary | ICD-10-CM

## 2023-12-11 DIAGNOSIS — Z23 Encounter for immunization: Secondary | ICD-10-CM | POA: Diagnosis not present

## 2023-12-11 LAB — POCT GLYCOSYLATED HEMOGLOBIN (HGB A1C): HbA1c, POC (controlled diabetic range): 7.5 % — AB (ref 0.0–7.0)

## 2023-12-11 MED ORDER — TRULICITY 0.75 MG/0.5ML ~~LOC~~ SOAJ: 0.7500 mg | SUBCUTANEOUS | 3 refills | Status: DC

## 2023-12-11 NOTE — Patient Instructions (Addendum)
 It was wonderful to see you today! Thank you for choosing [facility].   Please bring ALL of your medications with you to every visit.   Today we talked about:  Your A1c is 7.5, you have been very consistent with your diabetes.  Since the metformin  is causing so much stomach upset and diarrhea is please stop taking it.  We are going to start Trulicity that you will use 1 time per week on any day, does not matter which day.  You will inject it like you to your insulin  and your belly fat.  Please weigh yourself next month to see if you have lost any weight given you had weight loss previously with Ozempic .  Do not have a scale at home please come back to our clinic in follow-up for a weight check to make sure this is the right medication for you.  Please continue to check your fasting blood sugar and blood sugars throughout the day as you have been. As we discussed please check your feet every night.  The area that has a callus you can use gentle exfoliation but be sure not to cause an ulcer in the skin as this can be slow to heal with your underlying diabetes. I am sending you to GI for colonoscopy since your last one was in 2015.  It is possible this could be last 1, please follow-up to discuss with them further.  Please follow up in 3 months   If you haven't already, sign up for My Chart to have easy access to your labs results, and communication with your primary care physician.  Call the clinic at [phone] if your symptoms worsen or you have any concerns.  Please be sure to schedule follow up at the front desk before you leave today.   Izetta Nap, DO Family Medicine

## 2023-12-11 NOTE — Progress Notes (Signed)
    SUBJECTIVE:   CHIEF COMPLAINT / HPI:   Diabetes Current Regimen: Jardiance  25 mg daily, Lantus  10 units daily, metformin  1.5g daily (stopped for about 1 week due to stomach update, happened about 2 weeks ago). Did do Ozempic  but lost too much weight.  CBGs: Checking throughout the day. Fasting 80-120s, some higher postprandial BGL. Last A1c:  Lab Results  Component Value Date   HGBA1C 7.5 (A) 12/11/2023    Denies polyuria, polydipsia, hypoglycemia. Last Eye Exam: UTD Statin: Atorvastatin  40 mg daily ACE/ARB: Losartan 50 mg daily Followed up in the past month, sees Dr. Dennise.  Reports he is getting follow-up on his kidney function, started on sodium bicarb and Lokelma.  PERTINENT  PMH / PSH: HTN, T2DM, CKD 3B, HLD  OBJECTIVE:   BP 128/66   Pulse 75   Wt 122 lb (55.3 kg)   SpO2 99%   BMI 20.30 kg/m    General: Alert, no apparent distress, well groomed HEENT: Normocephalic, atraumatic, moist mucus membranes, neck supple Respiratory: Normal respiratory effort GI: Non-distended Skin: No rashes, no jaundice Psych: Appropriate mood and affect Foot: 2+ dorsalis pedis pulses bilaterally.  Feet inspected without any ulcerations or skin breakdown noted.  Small callus on dorsal surface of fifth metatarsal.  ASSESSMENT/PLAN:   Assessment & Plan Type 2 diabetes mellitus with stage 3b chronic kidney disease, with long-term current use of insulin  (HCC) A1c 7.5, similar with prior but not tolerating metformin .  Fasting CBGs 80-1 20, will hold Lantus  at 10 units daily.  Discussed trialing GLP-1 with less weight loss such as Trulicity versus starting mealtime insulin , patient agreeable to GLP-1 and will track weight. -Start Trulicity 0.5 mg weekly -Stop metformin  1500 mg daily -Continue Lantus  10 units daily and Jardiance  25 mg daily -Follow-up for A1c in 3 months or sooner if weight decreases after 4 to 6 weeks Colon cancer screening Agreeable to colonoscopy, referred to  GI. Encounter for immunization Received flu vaccination   Dr. Izetta Nap, DO [facility]

## 2024-02-24 ENCOUNTER — Ambulatory Visit (HOSPITAL_COMMUNITY)

## 2024-02-24 ENCOUNTER — Encounter (HOSPITAL_COMMUNITY): Payer: Self-pay

## 2024-02-24 ENCOUNTER — Emergency Department (HOSPITAL_COMMUNITY)
Admission: EM | Admit: 2024-02-24 | Discharge: 2024-02-24 | Disposition: A | Discharge status: Home / Self Care | Attending: Emergency Medicine | Admitting: Emergency Medicine

## 2024-02-24 ENCOUNTER — Emergency Department (HOSPITAL_COMMUNITY)

## 2024-02-24 ENCOUNTER — Other Ambulatory Visit: Payer: Self-pay

## 2024-02-24 DIAGNOSIS — M25541 Pain in joints of right hand: Secondary | ICD-10-CM | POA: Insufficient documentation

## 2024-02-24 DIAGNOSIS — N189 Chronic kidney disease, unspecified: Secondary | ICD-10-CM | POA: Insufficient documentation

## 2024-02-24 DIAGNOSIS — Z7982 Long term (current) use of aspirin: Secondary | ICD-10-CM | POA: Insufficient documentation

## 2024-02-24 LAB — CBG MONITORING, ED: Glucose-Capillary: 141 mg/dL — ABNORMAL HIGH (ref 70–99)

## 2024-02-24 MED ORDER — PREDNISONE 20 MG PO TABS
40.0000 mg | ORAL_TABLET | Freq: Once | ORAL | Status: AC
  Administered 2024-02-24: 40 mg via ORAL
  Filled 2024-02-24: qty 2

## 2024-02-24 MED ORDER — PREDNISONE 20 MG PO TABS: ORAL_TABLET | ORAL | 0 refills | Status: AC

## 2024-02-24 NOTE — ED Triage Notes (Signed)
 Pt c.o right hand pain since last week, states he cannot straighten or bend his right middle finger. Pt denies injury/trauma.

## 2024-02-24 NOTE — Discharge Instructions (Addendum)
 As we discussed, follow up with Dr. Alyse, who is a hand specialist, for further outpatient management of joint pain in the right hand. Take prednisone  on each of the next 2 days. You were given your first dose in the emergency department so the prescription provides doses for tomorrow and the next day.

## 2024-02-24 NOTE — ED Notes (Signed)
 Pt provided with discharge and follow up instructions, medications discussed, pt verbalized understanding. Pt ambulatory out of ED w/ all paperwork and belongings in NAD.

## 2024-02-24 NOTE — ED Provider Notes (Signed)
 " [place] EMERGENCY DEPARTMENT AT [facility] Provider Note   CSN: [id number] Arrival date & time: 02/24/24  1112     Patient presents with: Hand Pain   Jordan Rodriguez is a 73 y.o. male.   Patient to ED for evaluation of symptoms involving the right hand described as the feeling the 3rd MCP joint pops out of place, which started 2 weeks ago without injury. Over the last 3-4 days, he reports pain in the 3rd finger with flexion and extension, localizing his pain to the dorsal MCP joint. No redness or wound. No numbness.   The history is provided by the patient. No language interpreter was used.  Hand Pain       Prior to Admission medications  Medication Sig Start Date End Date Taking? Authorizing Provider  predniSONE  (DELTASONE ) 20 MG tablet Take 2 tablets on days 2 and 3 - Day 1 given in ED 02/24/24  Yes Romie Tay, Margit, PA-C  ACCU-CHEK GUIDE TEST test strip USE TO TEST BLOOD SUGAR 3 TIMES PER DAY 01/17/23   Theophilus Pagan, MD  Accu-Chek Softclix Lancets lancets SMARTSIG:1 Topical 4 Times Daily 10/21/19   [provider]  Accu-Chek Softclix Lancets lancets Use as instructed 04/02/20   Espinoza, Alejandra, DO  aspirin  EC 81 MG tablet Take 1 tablet (81 mg total) by mouth daily. 07/05/21   Espinoza, Alejandra, DO  atorvastatin  (LIPITOR) 40 MG tablet Take 1 tablet (40 mg total) by mouth daily. 02/14/23   Theophilus Pagan, MD  blood glucose meter kit and supplies KIT Dispense based on patient and insurance preference. Use up to four times daily as directed. (FOR ICD-9 250.00, 250.01). 09/09/19   Jarrett Lucie SAILOR, DO  Dulaglutide  (TRULICITY ) 0.75 MG/0.5ML SOAJ Inject 0.75 mg into the skin once a week. 12/11/23   Theophilus Pagan, MD  empagliflozin  (JARDIANCE ) 25 MG TABS tablet Take 1 tablet by mouth once daily 11/07/23   Theophilus Pagan, MD  Insulin  Pen Needle (BD PEN NEEDLE NANO 2ND GEN) 32G X 4 MM MISC USE AS DIRECTED THREE TIMES DAILY TO  CHECK  BLOOD  GLUCOSE  08/12/22   Espinoza, Alejandra, DO  Lancet Devices (ACCU-CHEK SOFTCLIX) lancets Check blood glucose up to 3 times daily. 10/13/15   Lue Lavanda Fairy, MD  LANTUS  SOLOSTAR 100 UNIT/ML Solostar Pen Inject 8 Units into the skin daily. 09/11/23   Theophilus Pagan, MD  losartan (COZAAR) 50 MG tablet Take 50 mg by mouth at bedtime. 02/24/20   [provider]  sodium bicarbonate 650 MG tablet Take 650 mg by mouth 2 (two) times daily.    [provider]  sodium zirconium cyclosilicate (LOKELMA) 10 g PACK packet Take 10 g by mouth 3 (three) times a week.    Dennise Hoes, MD  Zoster Vaccine Adjuvanted (SHINGRIX ) injection Administer Shingrix  vaccination now and repeat in two months 09/11/23   Theophilus Pagan, MD    Allergies: Patient has no known allergies.    Review of Systems  Updated Vital Signs BP (!) 165/71   Pulse 72   Temp 98.2 F (36.8 C)   Resp 14   Ht 5' 5 (1.651 m)   Wt 57.2 kg   SpO2 100%   BMI 20.97 kg/m   Physical Exam Vitals and nursing note reviewed.  Constitutional:      Appearance: He is well-developed.  Pulmonary:     Effort: Pulmonary effort is normal.  Musculoskeletal:        General: Normal range of motion.  Cervical back: Normal range of motion.     Comments: In the right hand there is minimal swelling of the dorsal 3rd MCP joint. No crepitance. No swelling of the dorsal or volar finger. No tendon strength deficits with FROM of the finger. Cap RF WNL. No sensory deficits.   Skin:    General: Skin is warm and dry.  Neurological:     Mental Status: He is alert and oriented to person, place, and time.     (all labs ordered are listed, but only abnormal results are displayed) Labs Reviewed  CBG MONITORING, ED - Abnormal; Notable for the following components:      Result Value   Glucose-Capillary 141 (*)    All other components within normal limits    EKG: None  Radiology: DG Hand Complete Right Result Date: 02/24/2024 EXAM: 3  or more VIEW(S) XRAY OF THE HAND 02/24/2024 12:44:00 PM COMPARISON: None available. CLINICAL HISTORY: hand pain FINDINGS: BONES AND JOINTS: Healed fifth metacarpal fracture. 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 No malalignment. SOFT TISSUES: The soft tissues are unremarkable. IMPRESSION: 1. No acute findings. Electronically signed by: Morgane Naveau MD 02/24/2024 01:47 PM EST RP Workstation: [id number]     Procedures   Medications Ordered in the ED  predniSONE  (DELTASONE ) tablet 40 mg (has no administration in time range)    Clinical Course as of 02/24/24 1616  Sat Feb 24, 2024  1609 Patient with symptoms x 2 weeks of popping of 3rd MCP joint of the right hand. Painful ROM x 3-4 days. No redness, warmth or volar tenderness of the finger to suggest tenosynovitis. Xray of the hand reveals no bony abnormality. DDx: arthritis, vs tendonitis. Plan to refer to hand ortho for recheck and further treatment. Will do 3 days of 40 mg prednisone . H/O CKD preventing regular NSAID use.  [SU]    Clinical Course User Index [SU] Odell Balls, PA-C                                 Medical Decision Making Amount and/or Complexity of Data Reviewed Radiology: ordered.        Final diagnoses:  Metacarpophalangeal joint pain of right hand    ED Discharge Orders          Ordered    predniSONE  (DELTASONE ) 20 MG tablet        02/24/24 1615               Odell Balls, PA-C 02/24/24 1616    Horton, Roxie HERO, DO 02/24/24 1634  "

## 2024-02-24 NOTE — ED Provider Triage Note (Signed)
 Emergency Medicine Provider Triage Evaluation Note  Jordan Rodriguez , a 73 y.o. male  was evaluated in triage.  Pt complains of right hand pain.  Patient notes several days of discomfort around the knuckle of his right middle finger, reports it pops out sometimes and he has not been able to pop it back in.  Denies any specific injury/inciting event.  Review of Systems  Positive: As above Negative: As above  Physical Exam  BP (!) 172/101   Pulse 78   Temp (!) 97.5 F (36.4 C)   Resp 16   Ht 5' 5 (1.651 m)   Wt 57.2 kg   SpO2 100%   BMI 20.97 kg/m  Gen:   Awake, no distress   Resp:  Normal effort  MSK:   Moves extremities without difficulty  Other:  R hand: Limited flexion/extension of the middle finger at the [facility]  Medical Decision Making  Medically screening exam initiated at 12:24 PM.  Appropriate orders placed.  [place] was informed that the remainder of the evaluation will be completed by another provider, this initial triage assessment does not replace that evaluation, and the importance of remaining in the ED until their evaluation is complete.     Glendia Rocky SAILOR, [place] 02/24/24 1225

## 2024-02-25 ENCOUNTER — Other Ambulatory Visit: Payer: Self-pay | Admitting: Family Medicine

## 2024-02-29 ENCOUNTER — Other Ambulatory Visit: Payer: Self-pay | Admitting: Family Medicine

## 2024-03-13 NOTE — Progress Notes (Signed)
" ° ° °  SUBJECTIVE:   CHIEF COMPLAINT / HPI:   Discussed the use of AI scribe software for clinical note transcription with the patient, who gave verbal consent to proceed.  Diabetes Current Regimen: Trulicity  0.75 mg weekly, Jardiance  25 mg daily, Lantus  10 units daily CBGs: Glucometers: Fasting - 80-140s. Afternoon - 180-300, high variability.  Last A1c:  Lab Results  Component Value Date   HGBA1C 8.8 (A) 03/14/2024    Denies polyuria, polydipsia, hypoglycemia.  Last Eye Exam: Completed Statin: Atorvastatin  40 mg daily ACE/ARB: Losartan 50 mg daily -Eating a lot of fruit and cereal which he feels may have driven up his blood sugar. -Avoids juice, soda, and sweet tea; uses sugar-free water additives -Lives alone with limited food assistance that does not last the full month -Reduced insurance benefits and children occasionally using his food further limit ability to maintain diabetic diet   CKD3b - Receives nephrology follow-up every six months - Metformin  discontinued due to stomach upset and renal concerns - Takes Lokelma 3 times per week  PERTINENT  PMH / PSH: HTN, T2DM, CKD 3B, HLD   OBJECTIVE:   Pulse 67   Wt 121 lb 6.4 oz (55.1 kg)   SpO2 100%   BMI 20.20 kg/m    General: Alert, no apparent distress, well groomed HEENT: Normocephalic, atraumatic, moist mucus membranes, neck supple Respiratory: Normal respiratory effort GI: Non-distended Skin: No rashes, no jaundice Psych: Appropriate mood and affect  ASSESSMENT/PLAN:   Assessment & Plan Type 2 diabetes mellitus with stage 3b chronic kidney disease, with long-term current use of insulin  (HCC) A1c 8.8, increased from prior after transitioning from metformin  to Trulicity .  May be diet mediated or needs increased dose of Trulicity  for effect.  Discussed options, patient would like to increase Trulicity  dose as opposed to adding mealtime insulin .  Weight trend appropriate, will follow-up in 1 month to assess CBGs and  weight if not improved consider addition of SAI.  Declined interest in CGM. - Increase to Trulicity  1.5 mg weekly - Follow-up in 1 month for CBGs and weight check Hyperkalemia Follows with nephrology, on Lokelma 3 times weekly but only follows with them every 6 months.  Unsure if he continues to need to take this medication. -BMP for K monitoring Food insecurity Provided with food bag today.  Declined additional [employer]WS support for food resources.   Dr. Izetta Nap, DO [facility]     "

## 2024-03-14 ENCOUNTER — Ambulatory Visit: Payer: Self-pay | Admitting: Family Medicine

## 2024-03-14 ENCOUNTER — Encounter: Payer: Self-pay | Admitting: Family Medicine

## 2024-03-14 VITALS — BP 143/79 | HR 67 | Wt 121.4 lb

## 2024-03-14 DIAGNOSIS — Z5941 Food insecurity: Secondary | ICD-10-CM | POA: Diagnosis not present

## 2024-03-14 DIAGNOSIS — Z794 Long term (current) use of insulin: Secondary | ICD-10-CM | POA: Diagnosis not present

## 2024-03-14 DIAGNOSIS — E1122 Type 2 diabetes mellitus with diabetic chronic kidney disease: Secondary | ICD-10-CM | POA: Diagnosis not present

## 2024-03-14 DIAGNOSIS — N1832 Chronic kidney disease, stage 3b: Secondary | ICD-10-CM | POA: Diagnosis not present

## 2024-03-14 DIAGNOSIS — E875 Hyperkalemia: Secondary | ICD-10-CM | POA: Diagnosis not present

## 2024-03-14 LAB — POCT GLYCOSYLATED HEMOGLOBIN (HGB A1C): HbA1c, POC (controlled diabetic range): 8.8 % — AB (ref 0.0–7.0)

## 2024-03-14 MED ORDER — TRULICITY 1.5 MG/0.5ML ~~LOC~~ SOAJ: 1.5000 mg | SUBCUTANEOUS | 5 refills | Status: AC

## 2024-03-14 NOTE — Assessment & Plan Note (Signed)
 Follows with nephrology, on Lokelma 3 times weekly but only follows with them every 6 months.  Unsure if he continues to need to take this medication. -BMP for K monitoring

## 2024-03-14 NOTE — Patient Instructions (Addendum)
 It was wonderful to see you today! Thank you for choosing [facility].   Please bring ALL of your medications with you to every visit.   Today we talked about:  For your diabetes your A1c is up a little at 8.8, please continue to check your blood sugars throughout the day so we this.  We are increasing your Trulicity  dosing and please watch her diet over the next month.  We will check your weight and blood sugars after a month and see if this is a good plan and if not may consider adding mealtime insulin . Will check on your potassium today and see if any adjustments need to be made to your [facility].  I will follow-up with you regarding those results especially if any medication changes are needed I will call you. If you have any concerns about food access please let me know and I be happy to reach back out to our social work team for more resources. Please call GI doctor to schedule an appointment for a colonoscopy  [facility] [address] [place] [address], [place], [place] [address] Phone: [phone]  Please follow up in 1 months   If you haven't already, sign up for My Chart to have easy access to your labs results, and communication with your primary care physician.   We are checking some labs today. If they are abnormal, I will call you. If they are normal, I will send you a MyChart message (if it is active) or a letter in the mail. If you do not hear about your labs in the next 2 weeks, please call the office.  Call the clinic at [phone] if your symptoms worsen or you have any concerns.  Please be sure to schedule follow up at the front desk before you leave today.   Izetta Nap, DO Family Medicine

## 2024-03-15 ENCOUNTER — Ambulatory Visit: Payer: Self-pay | Admitting: Family Medicine

## 2024-03-15 LAB — BASIC METABOLIC PANEL WITH GFR
BUN/Creatinine Ratio: 16 (ref 10–24)
BUN: 31 mg/dL — ABNORMAL HIGH (ref 8–27)
CO2: 21 mmol/L (ref 20–29)
Calcium: 8.8 mg/dL (ref 8.6–10.2)
Chloride: 104 mmol/L (ref 96–106)
Creatinine, Ser: 1.92 mg/dL — ABNORMAL HIGH (ref 0.76–1.27)
Glucose: 124 mg/dL — ABNORMAL HIGH (ref 70–99)
Potassium: 4.8 mmol/L (ref 3.5–5.2)
Sodium: 140 mmol/L (ref 134–144)
eGFR: 37 mL/min/1.73 — ABNORMAL LOW

## 2024-03-19 ENCOUNTER — Other Ambulatory Visit: Payer: Self-pay | Admitting: Family Medicine

## 2024-03-19 DIAGNOSIS — N1832 Chronic kidney disease, stage 3b: Secondary | ICD-10-CM

## 2024-03-26 ENCOUNTER — Encounter: Payer: Self-pay | Admitting: Internal Medicine

## 2024-04-12 ENCOUNTER — Ambulatory Visit: Payer: Self-pay | Admitting: Family Medicine

## 2024-05-02 ENCOUNTER — Encounter

## 2024-05-06 ENCOUNTER — Encounter

## 2024-05-20 ENCOUNTER — Encounter: Admitting: Internal Medicine
